# Patient Record
Sex: Female | Born: 1945 | Race: White | Hispanic: No | Marital: Single | Smoking: Former smoker
Health system: Southern US, Community
[De-identification: ages and names within clinical notes are randomized; demographics above are authoritative.]

## PROBLEM LIST (undated history)

## (undated) DIAGNOSIS — M810 Age-related osteoporosis without current pathological fracture: Secondary | ICD-10-CM

## (undated) DIAGNOSIS — K222 Esophageal obstruction: Secondary | ICD-10-CM

## (undated) DIAGNOSIS — F338 Other recurrent depressive disorders: Secondary | ICD-10-CM

## (undated) DIAGNOSIS — K589 Irritable bowel syndrome without diarrhea: Secondary | ICD-10-CM

## (undated) DIAGNOSIS — M4800 Spinal stenosis, site unspecified: Secondary | ICD-10-CM

## (undated) DIAGNOSIS — M1990 Unspecified osteoarthritis, unspecified site: Secondary | ICD-10-CM

## (undated) DIAGNOSIS — K859 Acute pancreatitis without necrosis or infection, unspecified: Secondary | ICD-10-CM

## (undated) DIAGNOSIS — T7840XA Allergy, unspecified, initial encounter: Secondary | ICD-10-CM

## (undated) DIAGNOSIS — K635 Polyp of colon: Secondary | ICD-10-CM

## (undated) DIAGNOSIS — E079 Disorder of thyroid, unspecified: Secondary | ICD-10-CM

## (undated) HISTORY — DX: Acute pancreatitis without necrosis or infection, unspecified: K85.90

## (undated) HISTORY — DX: Irritable bowel syndrome, unspecified: K58.9

## (undated) HISTORY — DX: Allergy, unspecified, initial encounter: T78.40XA

## (undated) HISTORY — DX: Polyp of colon: K63.5

## (undated) HISTORY — DX: Esophageal obstruction: K22.2

## (undated) HISTORY — DX: Spinal stenosis, site unspecified: M48.00

## (undated) HISTORY — PX: JOINT REPLACEMENT: SHX530

## (undated) HISTORY — PX: TONSILLECTOMY: SUR1361

## (undated) HISTORY — DX: Age-related osteoporosis without current pathological fracture: M81.0

## (undated) HISTORY — DX: Unspecified osteoarthritis, unspecified site: M19.90

## (undated) HISTORY — DX: Other recurrent depressive disorders: F33.8

## (undated) HISTORY — DX: Disorder of thyroid, unspecified: E07.9

## (undated) HISTORY — PX: FOOT SURGERY: SHX648

---

## 1997-12-08 ENCOUNTER — Ambulatory Visit (HOSPITAL_COMMUNITY): Admission: RE | Admit: 1997-12-08 | Discharge: 1997-12-08 | Payer: Self-pay | Admitting: Orthopedic Surgery

## 1999-01-26 ENCOUNTER — Ambulatory Visit (HOSPITAL_COMMUNITY): Admission: RE | Admit: 1999-01-26 | Discharge: 1999-01-26 | Payer: Self-pay | Admitting: Specialist

## 1999-01-26 ENCOUNTER — Encounter: Payer: Self-pay | Admitting: Specialist

## 1999-05-24 HISTORY — PX: HIP SURGERY: SHX245

## 2000-01-25 ENCOUNTER — Other Ambulatory Visit: Admission: RE | Admit: 2000-01-25 | Discharge: 2000-01-25 | Payer: Self-pay | Admitting: Obstetrics and Gynecology

## 2000-04-26 ENCOUNTER — Ambulatory Visit (HOSPITAL_COMMUNITY): Admission: RE | Admit: 2000-04-26 | Discharge: 2000-04-26 | Payer: Self-pay | Admitting: Gastroenterology

## 2000-04-26 ENCOUNTER — Encounter (INDEPENDENT_AMBULATORY_CARE_PROVIDER_SITE_OTHER): Payer: Self-pay | Admitting: Specialist

## 2001-01-31 ENCOUNTER — Other Ambulatory Visit: Admission: RE | Admit: 2001-01-31 | Discharge: 2001-01-31 | Payer: Self-pay | Admitting: Obstetrics and Gynecology

## 2001-08-23 ENCOUNTER — Ambulatory Visit (HOSPITAL_COMMUNITY): Admission: RE | Admit: 2001-08-23 | Discharge: 2001-08-23 | Payer: Self-pay | Admitting: Gastroenterology

## 2002-01-31 ENCOUNTER — Other Ambulatory Visit: Admission: RE | Admit: 2002-01-31 | Discharge: 2002-01-31 | Payer: Self-pay | Admitting: Obstetrics and Gynecology

## 2003-02-03 ENCOUNTER — Other Ambulatory Visit: Admission: RE | Admit: 2003-02-03 | Discharge: 2003-02-03 | Payer: Self-pay | Admitting: Obstetrics and Gynecology

## 2004-02-18 ENCOUNTER — Other Ambulatory Visit: Admission: RE | Admit: 2004-02-18 | Discharge: 2004-02-18 | Payer: Self-pay | Admitting: Obstetrics and Gynecology

## 2004-06-04 ENCOUNTER — Emergency Department (HOSPITAL_COMMUNITY): Admission: EM | Admit: 2004-06-04 | Discharge: 2004-06-04 | Payer: Self-pay | Admitting: Family Medicine

## 2004-06-14 ENCOUNTER — Encounter: Admission: RE | Admit: 2004-06-14 | Discharge: 2004-06-14 | Payer: Self-pay | Admitting: Family Medicine

## 2005-02-18 ENCOUNTER — Other Ambulatory Visit: Admission: RE | Admit: 2005-02-18 | Discharge: 2005-02-18 | Payer: Self-pay | Admitting: Obstetrics and Gynecology

## 2006-02-28 ENCOUNTER — Other Ambulatory Visit: Admission: RE | Admit: 2006-02-28 | Discharge: 2006-02-28 | Payer: Self-pay | Admitting: Obstetrics and Gynecology

## 2006-11-14 ENCOUNTER — Ambulatory Visit (HOSPITAL_BASED_OUTPATIENT_CLINIC_OR_DEPARTMENT_OTHER): Admission: RE | Admit: 2006-11-14 | Discharge: 2006-11-14 | Payer: Self-pay | Admitting: Orthopedic Surgery

## 2007-02-28 ENCOUNTER — Encounter: Admission: RE | Admit: 2007-02-28 | Discharge: 2007-02-28 | Payer: Self-pay | Admitting: Family Medicine

## 2007-04-10 ENCOUNTER — Other Ambulatory Visit: Admission: RE | Admit: 2007-04-10 | Discharge: 2007-04-10 | Payer: Self-pay | Admitting: Obstetrics and Gynecology

## 2008-05-08 ENCOUNTER — Encounter: Payer: Self-pay | Admitting: Obstetrics and Gynecology

## 2008-05-08 ENCOUNTER — Ambulatory Visit: Payer: Self-pay | Admitting: Obstetrics and Gynecology

## 2008-05-08 ENCOUNTER — Other Ambulatory Visit: Admission: RE | Admit: 2008-05-08 | Discharge: 2008-05-08 | Payer: Self-pay | Admitting: Obstetrics and Gynecology

## 2008-08-26 ENCOUNTER — Ambulatory Visit: Payer: Self-pay | Admitting: Internal Medicine

## 2008-11-21 ENCOUNTER — Encounter: Admission: RE | Admit: 2008-11-21 | Discharge: 2008-11-21 | Payer: Self-pay | Admitting: Internal Medicine

## 2008-11-21 ENCOUNTER — Ambulatory Visit: Payer: Self-pay | Admitting: Internal Medicine

## 2009-05-23 ENCOUNTER — Ambulatory Visit: Payer: Self-pay | Admitting: Internal Medicine

## 2009-09-03 ENCOUNTER — Ambulatory Visit: Payer: Self-pay | Admitting: Internal Medicine

## 2009-10-08 ENCOUNTER — Ambulatory Visit: Payer: Self-pay | Admitting: Internal Medicine

## 2010-03-04 ENCOUNTER — Ambulatory Visit: Payer: Self-pay | Admitting: Internal Medicine

## 2010-09-09 ENCOUNTER — Ambulatory Visit (INDEPENDENT_AMBULATORY_CARE_PROVIDER_SITE_OTHER): Payer: 59 | Admitting: Internal Medicine

## 2010-09-09 DIAGNOSIS — E039 Hypothyroidism, unspecified: Secondary | ICD-10-CM

## 2010-09-09 DIAGNOSIS — E785 Hyperlipidemia, unspecified: Secondary | ICD-10-CM

## 2010-10-04 ENCOUNTER — Other Ambulatory Visit (INDEPENDENT_AMBULATORY_CARE_PROVIDER_SITE_OTHER): Payer: 59

## 2010-10-04 VITALS — Temp 98.4°F

## 2010-10-04 DIAGNOSIS — R300 Dysuria: Secondary | ICD-10-CM

## 2010-10-04 LAB — POCT URINALYSIS DIPSTICK
Bilirubin, UA: NEGATIVE
Blood, UA: NEGATIVE
Glucose, UA: NEGATIVE
Ketones, UA: NEGATIVE
Nitrite, UA: NEGATIVE
Protein, UA: NEGATIVE
Spec Grav, UA: 1.005
Urobilinogen, UA: NEGATIVE
pH, UA: 7.5

## 2010-10-04 MED ORDER — CEPHALEXIN 500 MG PO CAPS: 500.0000 mg | ORAL_CAPSULE | Freq: Four times a day (QID) | ORAL | Status: AC

## 2010-10-04 NOTE — Progress Notes (Signed)
Patient here with dysuria, and frequency past 4 days. No fever, no nausea vomiting.

## 2010-10-05 NOTE — Op Note (Signed)
NAMEIVONE, LICHT                ACCOUNT NO.:  [id number]   MEDICAL RECORD NO.:  [id number]          PATIENT TYPE:  AMB   LOCATION:  DSC                          FACILITY:  [facility]   PHYSICIAN:  Leonides Grills, M.D.     DATE OF BIRTH:  [birth year]   DATE OF PROCEDURE:  11/14/2006  DATE OF DISCHARGE:                               OPERATIVE REPORT   PREOPERATIVE DIAGNOSES:  1. Left second, third metatarsalgia.  2. Left second, third and fourth hammertoes.  3. Left fourth claw toe.   POSTOPERATIVE DIAGNOSES:  1. Left second, third metatarsalgia.  2. Left second, third and fourth hammertoes.  3. Left fourth claw toe.   OPERATION:  1. Left second and third Weil metatarsal shortening osteotomies.  2. Left second, third and fourth toes metatarsophalangeal joint dorsal      capsulotomy with collateral release.  3. Left second, third and fourth toes extensor digitorum brevis to      extensor digitorum longus tendon transfers.  4. Left third toe medial collateral ligament plication.  5. Left fourth toe middle phalanx head resection with extensor      digitorum longus shortening.  6. Left fourth toe flexor pollicis longus tenotomy.   ANESTHESIA:  General with block.   SURGEON:  Leonides Grills, M.D.   ASSISTANT:  Evlyn Kanner, P.A.   ESTIMATED BLOOD LOSS:  Minimal.   TOURNIQUET TIME:  Approximately hour.   COMPLICATIONS:  None.   DISPOSITION:  Stable to PR.   INDICATIONS:  This is a 65 year old female who has had longstanding left  forefoot pain that is interfering with her life to the point where she  could not do what she wanted to do despite conservative management.  She  was consented for the above procedure.  All risks which include  infection, nerve or vessel injury, recurrence of deformity, persistent  pain, nonunion, malunion, hardware irritation, hardware failure,  weakness of toe extension and troop toe, cock-up toe deformity were all  explained.  Questions encouraged  answered.   OPERATION:  The patient brought to the operating placed in supine  position after adequate general endotracheal tube anesthesia was  administered with block as well as Ancef gram IV piggyback, left lower  extremity was prepped and draped in sterile manner over a proximally  thigh tourniquet.  Limb was gravity exsanguinated.  Tourniquet elevated  290 mmHg.  A longitudinal incision over the dorsal aspect left second  toe was then made.  Dissection was carried down through skin.  Hemostasis was obtained.  EDL and EDB tendons were identified.  These  were carefully dissected out.  The EDL was then tenotomized proximal  medial and the brevis distal lateral and retracted out of harm's way for  later transfer.  The MTP joint dorsal capsulotomy was then performed  with a 15 blade scalpel protecting soft tissues both medial and  laterally.  The collateral ligaments released as well.  We then plantar  flexed the toe fully and then performed a Weil metatarsal area osteotomy  with stacked sagittal saw blades.  The cut was made parallel to plantar  aspect of foot.  The head was then translated approximately 3-4 mm  proximally and fixed with a 1.5-mm fully threaded cortical set screw  measuring 12 mm in length using a 1.1-mm drill hole respectively.  This  excellent purchase and maintenance of the correction.  This was also  countersunk.  The redundant bone ledge dorsally was then trimmed off  with the rongeur carefully.  Joint were copiously irrigated with normal  saline.  Bone wax applied to exposed bone surfaces.  We then performed  an EDB to EPL tendon transfer using 3-0 PDS stitch.  This had an  outstanding transfer. We then went to the fourth toe and due to claw toe  deformity along with the tight extensor apparatus we performed a  longitudinal incision over the MTP joint area.  Dissection was carried  down through skin.  Hemostasis was obtained.  The EDL tendon was  tenotomized  proximal medial and brevis distal lateral and retracted out  of harm's way for later transfer.  We then made a longitudinal incision  over the dorsal aspect of the fourth toe DIP joint area.  Dissection was  carried down through skin.  Hemostasis was obtained and the EDL tendon  was tenotomized temporarily and retracted out of harm's way.  The DIP  joint was then entered.  The head was then removed with sagittal saw  followed by a rongeur.  The joint was then reduced and was in excellent  position we then made a longitudinal incision plantar aspect of the  fourth toe DIP.  Dissection was carried down through skin.  Hemostasis  was obtained.  EDL tendon was carefully dissected out and then  tenotomized with the 15 blade scalpel.  We then went back to the dorsal  aspect the DIP joint and then using a 3-0 PDS stitch.  We repaired and  shortened the EDL tendon.  This had an Conservation officer, historic buildings.  We then went  back to the dorsal aspect of the fourth toe MTP joint and performed the  EDB to EDL tendon transfer using 3-0 PDS stitch.  This had an  outstanding transfer, toes were excellent position.  The wounds were  copiously irrigated normal saline, tourniquet deflated.  Hemostasis was  obtained, final stress x-rays were obtained AP and lateral planes and  showed no gross motion across the osteotomy site, fixation proper  position and excellent alignment as well.  We then closed the wounds  with 4-0 nylon stitch.  Sterile dressing was applied.  Hard sole shoe  was applied.  The patient was stable to PR.   POSTOP COURSE:  The patient will follow-up in two weeks.  At that time  remove the dressing as well as suture.  We will then have her heal  weight bear for a total of 6 weeks and then weight bear as tolerated  hard sole shoe for additional two more weeks.  At two months postop, she  will go into a normal shoe.  Skin mobilization over the toes to start  immediately after the sutures removed.   Plantar flexion stretching is  not to commence until approximately six to eight weeks postop.  We will  give her also a double total loop for her toes once the sutures are  removed that she will wear.  Elevation is encouraged.      Leonides Grills, M.D.  Electronically Signed     PB/MEDQ  D:  11/14/2006  T:  11/14/2006  Job:  [id number]

## 2010-10-08 LAB — URINE CULTURE: Colony Count: >=100000

## 2010-10-11 ENCOUNTER — Other Ambulatory Visit: Payer: Self-pay | Admitting: *Deleted

## 2010-10-11 DIAGNOSIS — E039 Hypothyroidism, unspecified: Secondary | ICD-10-CM

## 2010-10-11 DIAGNOSIS — M1990 Unspecified osteoarthritis, unspecified site: Secondary | ICD-10-CM

## 2010-10-11 MED ORDER — LEVOTHYROXINE SODIUM 50 MCG PO TABS: 50.0000 ug | ORAL_TABLET | Freq: Every day | ORAL | Status: DC

## 2010-10-11 MED ORDER — CELECOXIB 200 MG PO CAPS: 200.0000 mg | ORAL_CAPSULE | Freq: Every day | ORAL | Status: AC

## 2011-03-02 ENCOUNTER — Encounter: Payer: Self-pay | Admitting: Internal Medicine

## 2011-03-08 ENCOUNTER — Other Ambulatory Visit: Payer: 59 | Admitting: Internal Medicine

## 2011-03-08 DIAGNOSIS — Z0000 Encounter for general adult medical examination without abnormal findings: Secondary | ICD-10-CM

## 2011-03-08 LAB — COMPREHENSIVE METABOLIC PANEL
ALT: 19 U/L (ref 0–35)
AST: 26 U/L (ref 0–37)
Albumin: 4.3 g/dL (ref 3.5–5.2)
Alkaline Phosphatase: 67 U/L (ref 39–117)
BUN: 16 mg/dL (ref 6–23)
Potassium: 4.4 mEq/L (ref 3.5–5.3)
Sodium: 141 mEq/L (ref 135–145)
Total Protein: 6.5 g/dL (ref 6.0–8.3)

## 2011-03-08 LAB — LIPID PANEL
Cholesterol: 235 mg/dL — ABNORMAL HIGH (ref 0–200)
HDL: 79 mg/dL (ref >39)
Total CHOL/HDL Ratio: 3 Ratio

## 2011-03-08 LAB — CBC WITH DIFFERENTIAL/PLATELET
Basophils Absolute: 0 10*3/uL (ref 0.0–0.1)
Basophils Relative: 1 % (ref 0–1)
MCHC: 32.4 g/dL (ref 30.0–36.0)
Neutro Abs: 4.4 10*3/uL (ref 1.7–7.7)
Neutrophils Relative %: 68 % (ref 43–77)
Platelets: 304 10*3/uL (ref 150–400)
RDW: 13.5 % (ref 11.5–15.5)
WBC: 6.5 10*3/uL (ref 4.0–10.5)

## 2011-03-08 LAB — TSH: TSH: 2.448 u[IU]/mL (ref 0.350–4.500)

## 2011-03-09 LAB — POCT HEMOGLOBIN-HEMACUE: Operator id: 128471

## 2011-03-10 ENCOUNTER — Encounter: Payer: Self-pay | Admitting: Internal Medicine

## 2011-03-10 ENCOUNTER — Ambulatory Visit (INDEPENDENT_AMBULATORY_CARE_PROVIDER_SITE_OTHER): Payer: 59 | Admitting: Internal Medicine

## 2011-03-10 DIAGNOSIS — M4800 Spinal stenosis, site unspecified: Secondary | ICD-10-CM

## 2011-03-10 DIAGNOSIS — E039 Hypothyroidism, unspecified: Secondary | ICD-10-CM

## 2011-03-10 DIAGNOSIS — J309 Allergic rhinitis, unspecified: Secondary | ICD-10-CM

## 2011-03-10 DIAGNOSIS — R300 Dysuria: Secondary | ICD-10-CM

## 2011-03-10 DIAGNOSIS — F39 Unspecified mood [affective] disorder: Secondary | ICD-10-CM

## 2011-03-10 DIAGNOSIS — Z0000 Encounter for general adult medical examination without abnormal findings: Secondary | ICD-10-CM

## 2011-03-10 DIAGNOSIS — M1990 Unspecified osteoarthritis, unspecified site: Secondary | ICD-10-CM

## 2011-03-10 DIAGNOSIS — F338 Other recurrent depressive disorders: Secondary | ICD-10-CM

## 2011-03-10 DIAGNOSIS — Z86010 Personal history of colonic polyps: Secondary | ICD-10-CM

## 2011-03-10 DIAGNOSIS — K219 Gastro-esophageal reflux disease without esophagitis: Secondary | ICD-10-CM

## 2011-03-10 DIAGNOSIS — K5900 Constipation, unspecified: Secondary | ICD-10-CM

## 2011-03-10 DIAGNOSIS — J45990 Exercise induced bronchospasm: Secondary | ICD-10-CM

## 2011-03-10 DIAGNOSIS — Z23 Encounter for immunization: Secondary | ICD-10-CM

## 2011-03-10 LAB — POCT URINALYSIS DIPSTICK
Glucose, UA: NEGATIVE
Ketones, UA: NEGATIVE
Spec Grav, UA: 1.005
Urobilinogen, UA: NEGATIVE

## 2011-03-10 MED ORDER — OMEGA-3-ACID ETHYL ESTERS 1 G PO CAPS: 2.0000 g | ORAL_CAPSULE | Freq: Two times a day (BID) | ORAL | Status: DC

## 2011-03-10 MED ORDER — LEVOTHYROXINE SODIUM 50 MCG PO TABS: 50.0000 ug | ORAL_TABLET | Freq: Every day | ORAL | Status: DC

## 2011-03-14 LAB — URINE CULTURE: Colony Count: >=100000

## 2011-03-27 DIAGNOSIS — J309 Allergic rhinitis, unspecified: Secondary | ICD-10-CM | POA: Insufficient documentation

## 2011-03-27 DIAGNOSIS — K219 Gastro-esophageal reflux disease without esophagitis: Secondary | ICD-10-CM | POA: Insufficient documentation

## 2011-03-27 DIAGNOSIS — F338 Other recurrent depressive disorders: Secondary | ICD-10-CM | POA: Insufficient documentation

## 2011-03-27 DIAGNOSIS — E039 Hypothyroidism, unspecified: Secondary | ICD-10-CM | POA: Insufficient documentation

## 2011-03-27 DIAGNOSIS — M4800 Spinal stenosis, site unspecified: Secondary | ICD-10-CM | POA: Insufficient documentation

## 2011-03-27 DIAGNOSIS — K5900 Constipation, unspecified: Secondary | ICD-10-CM | POA: Insufficient documentation

## 2011-03-27 DIAGNOSIS — M1990 Unspecified osteoarthritis, unspecified site: Secondary | ICD-10-CM | POA: Insufficient documentation

## 2011-03-27 DIAGNOSIS — J45990 Exercise induced bronchospasm: Secondary | ICD-10-CM | POA: Insufficient documentation

## 2011-03-27 NOTE — Patient Instructions (Signed)
Continue same medications. Return in 6 months for office visit, lipid panel and TSH

## 2011-03-27 NOTE — Progress Notes (Signed)
  Subjective:    Patient ID: Chelsea Cantrell, female    DOB: [birth year], 65 y.o.   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 In December 2010 had shots every 3 and dilated by Dr. Kinnie Scales. Says she's had dilatation for 5 times throughout her life. She is a vegetarian. Long-standing history of constipation treated by Dr. Kinnie Scales with Amitiza. History of hypothyroidism. History of mild hyperlipidemia treated with Lovaza. History of osteoarthritis in hands and back. History of exercise-induced asthma. History of allergic rhinitis and takes allergy injections. History of hyperplastic colon polyps. History of spinal stenosis and in 2011 had epidural steroid injection by Dr. Ethelene Hal. History of frequent bouts of pneumonia. Was on Wellbutrin for a while for seasonal affective disorder has quit taking that. [facility]. Shelle Iron at [facility] has treated her for back pain. GYN is Dr. Eda Paschal.  Single, nonsmoker, social alcohol consumption. Lives alone. Quit smoking in 1976. 4 year college degree.  Had tonsillectomy 1956, fractured left radial head proximally 2000. Right hip resurfacing surgery 2001. Surgery on left metatarsals 25 and 09/14/2006.  Mother died at age 74. Had stroke 4 years before she died. Basically just had general decline in health. Father died at 16 of lung cancer had history of hyperlipidemia and an MI at age 48. One brother and one sister in good health. No children.    Review of Systems  Constitutional: Negative.   HENT: Negative.   Eyes: Negative.   Respiratory: Negative.   Cardiovascular: Negative.   Gastrointestinal:       Chronic constipation controlled with medication  Musculoskeletal: Positive for back pain.  Neurological: Negative.   Hematological: Negative.   Psychiatric/Behavioral:       Seasonal affective disorder stable off Wellbutrin       Objective:   Physical Exam  Vitals reviewed. Constitutional: She is oriented to  person, place, and time. She appears well-nourished. No distress.  HENT:  Head: Normocephalic and atraumatic.  Right Ear: External ear normal.  Left Ear: External ear normal.  Mouth/Throat: Oropharynx is clear and moist.  Eyes: Conjunctivae and EOM are normal. Pupils are equal, round, and reactive to light. No scleral icterus.  Neck: Neck supple. No JVD present. No thyromegaly present.  Cardiovascular: Normal rate, regular rhythm and normal heart sounds.   No murmur heard. Pulmonary/Chest: Effort normal and breath sounds normal. She has no wheezes. She has no rales.  Abdominal: Soft. Bowel sounds are normal. She exhibits no mass. There is no tenderness.  Genitourinary:       Deferred to GYN  Musculoskeletal: Normal range of motion. She exhibits no edema.  Lymphadenopathy:    She has no cervical adenopathy.  Neurological: She is alert and oriented to person, place, and time. No cranial nerve deficit. Coordination normal.  Skin: Skin is warm and dry. She is not diaphoretic.  Psychiatric: She has a normal mood and affect. Her behavior is normal.          Assessment & Plan:  GE reflux  Osteoarthritis  Spinal stenosis  Hypothyroidism  History of hyperplastic colon polyps  History of Schatzki's ring dilatation  History of seasonal affective disorder  Allergic rhinitis  History of exercise-induced asthma  Hyperlipidemia  Plan return in 6 months for office visit and TSH and fasting lipid panel

## 2011-04-04 ENCOUNTER — Other Ambulatory Visit: Payer: Self-pay

## 2011-04-04 DIAGNOSIS — E039 Hypothyroidism, unspecified: Secondary | ICD-10-CM

## 2011-04-04 MED ORDER — SYNTHROID 50 MCG PO TABS: 50.0000 ug | ORAL_TABLET | Freq: Every day | ORAL | Status: DC

## 2011-04-18 ENCOUNTER — Ambulatory Visit (INDEPENDENT_AMBULATORY_CARE_PROVIDER_SITE_OTHER): Payer: 59 | Admitting: Internal Medicine

## 2011-04-18 ENCOUNTER — Encounter: Payer: Self-pay | Admitting: Internal Medicine

## 2011-04-18 VITALS — BP 122/74 | HR 58 | Wt 150.0 lb

## 2011-04-18 DIAGNOSIS — T148XXA Other injury of unspecified body region, initial encounter: Secondary | ICD-10-CM

## 2011-04-18 DIAGNOSIS — W57XXXA Bitten or stung by nonvenomous insect and other nonvenomous arthropods, initial encounter: Secondary | ICD-10-CM

## 2011-04-18 NOTE — Progress Notes (Signed)
  Subjective:    Patient ID: Chelsea Cantrell, female    DOB: [birth year], 65 y.o.   MRN: [id number]  HPI  patient  went hiking yesterday and this morning discovered a small tick on her right anterior abdomen. She tried to get the tick out but think she may have left a part of it inside. There is a dark spot in the center of the bite area with some surrounding erythema. Explained to patient to watch for signs of tickborne illnesses starting 10 days from exposure. Her tetanus immunization is up-to-date.    Review of Systems     Objective:   Physical Exam tick bite right anterior abdominal wall with apparently part of tick still left in center of bite With surrounding erythema approximately 1/4 in diameter. No drainage.        Assessment & Plan:  Tick bite right anterior abdomen wall plan Bactroban ointment to use on bite area twice daily until healed. If itching begins, triamcinolone cream 0.1% to use on bite area 3 times a day sparingly. Watch for signs of tickborne illness. Call if lesion developed into an abscess

## 2011-04-18 NOTE — Patient Instructions (Signed)
Watch for signs of tickborne illness beginning 10 days after exposure. Daily. Clean and dry apply Bactroban ointment twice daily. If the itching begins to use triamcinolone cream 0.1% 3 times daily. Call if evidence of secondary bacterial infection develops.

## 2011-07-18 ENCOUNTER — Ambulatory Visit: Payer: 59 | Admitting: Physical Therapy

## 2011-09-15 ENCOUNTER — Other Ambulatory Visit: Payer: 59 | Admitting: Internal Medicine

## 2011-09-15 DIAGNOSIS — E039 Hypothyroidism, unspecified: Secondary | ICD-10-CM

## 2011-09-15 DIAGNOSIS — E7800 Pure hypercholesterolemia, unspecified: Secondary | ICD-10-CM

## 2011-09-15 LAB — LIPID PANEL
HDL: 68 mg/dL (ref >39)
LDL Cholesterol: 119 mg/dL — ABNORMAL HIGH (ref 0–99)
VLDL: 12 mg/dL (ref 0–40)

## 2011-09-16 ENCOUNTER — Other Ambulatory Visit: Payer: 59 | Admitting: Internal Medicine

## 2011-09-16 ENCOUNTER — Encounter: Payer: Self-pay | Admitting: Internal Medicine

## 2011-09-16 ENCOUNTER — Ambulatory Visit (INDEPENDENT_AMBULATORY_CARE_PROVIDER_SITE_OTHER): Payer: 59 | Admitting: Internal Medicine

## 2011-09-16 VITALS — BP 120/80 | Wt 149.5 lb

## 2011-09-16 DIAGNOSIS — K5900 Constipation, unspecified: Secondary | ICD-10-CM

## 2011-09-16 DIAGNOSIS — J309 Allergic rhinitis, unspecified: Secondary | ICD-10-CM

## 2011-09-16 DIAGNOSIS — J029 Acute pharyngitis, unspecified: Secondary | ICD-10-CM

## 2011-09-16 DIAGNOSIS — F338 Other recurrent depressive disorders: Secondary | ICD-10-CM

## 2011-09-16 DIAGNOSIS — F39 Unspecified mood [affective] disorder: Secondary | ICD-10-CM

## 2011-09-16 DIAGNOSIS — E039 Hypothyroidism, unspecified: Secondary | ICD-10-CM

## 2011-09-16 DIAGNOSIS — E785 Hyperlipidemia, unspecified: Secondary | ICD-10-CM

## 2011-09-16 DIAGNOSIS — M1990 Unspecified osteoarthritis, unspecified site: Secondary | ICD-10-CM

## 2011-09-16 DIAGNOSIS — M4800 Spinal stenosis, site unspecified: Secondary | ICD-10-CM

## 2011-09-16 DIAGNOSIS — J069 Acute upper respiratory infection, unspecified: Secondary | ICD-10-CM

## 2011-09-16 DIAGNOSIS — K219 Gastro-esophageal reflux disease without esophagitis: Secondary | ICD-10-CM

## 2011-09-16 LAB — TSH: TSH: 1.163 u[IU]/mL (ref 0.350–4.500)

## 2011-09-16 NOTE — Progress Notes (Signed)
  Subjective:    Patient ID: Chelsea Cantrell, female    DOB: Mar 20, 1946, 66 y.o.   MRN: [id number]  HPI In today for six-month recheck. History of GE reflux and hypothyroidism. TSH is within normal limits. She has a respiratory infection that started with a sore throat. Thinks she may have had a fever but did not check her temperature. Slight cough. Constipation under good control on Amitiza. Dr. Kinnie Scales prescribes Amitiza. Arthritis under control with Celebrex. History of allergic rhinitis and seasonal affective disorder. History of hyperlipidemia and takes Lovaza. History of exercise-induced asthma. History of spinal stenosis and osteoarthritis. Back pain has been fairly well controlled recently.    Review of Systems     Objective:   Physical Exam HEENT exam: Pharynx slightly injected without exudate; TMs are clear; neck is supple, chest clear to auscultation. Cardiac exam regular rate and rhythm normal S1 and S2.        Assessment & Plan:  Hypothyroidism  Upper respiratory infection  Exercise-induced asthma  GE reflux  Spinal stenosis  Hyperlipidemia  Plan: Zithromax Z-Pak take 2 tablets by mouth day one followed by 1 tablet by mouth days 2 through 5 for respiratory infection. Continue same medications and return for physical exam in 6 months. Continue same dose of Synthroid. TSH is within normal limits.

## 2011-09-16 NOTE — Patient Instructions (Signed)
Take Zithromax Z-PAK as directed for respiratory infection. Continue same dose of Synthroid. Return in 6 months for physical exam.

## 2011-09-19 ENCOUNTER — Encounter: Payer: Self-pay | Admitting: Internal Medicine

## 2011-09-23 ENCOUNTER — Other Ambulatory Visit: Payer: Self-pay

## 2011-09-23 DIAGNOSIS — E039 Hypothyroidism, unspecified: Secondary | ICD-10-CM

## 2011-09-23 MED ORDER — SYNTHROID 50 MCG PO TABS: 50.0000 ug | ORAL_TABLET | Freq: Every day | ORAL | Status: DC

## 2011-10-10 ENCOUNTER — Other Ambulatory Visit: Payer: Self-pay

## 2011-10-10 MED ORDER — OMEGA-3-ACID ETHYL ESTERS 1 G PO CAPS: 2.0000 g | ORAL_CAPSULE | Freq: Two times a day (BID) | ORAL | Status: DC

## 2012-01-09 ENCOUNTER — Other Ambulatory Visit: Payer: Self-pay

## 2012-01-09 MED ORDER — CELECOXIB 200 MG PO CAPS: 200.0000 mg | ORAL_CAPSULE | Freq: Two times a day (BID) | ORAL | Status: DC

## 2012-01-16 ENCOUNTER — Telehealth: Payer: Self-pay | Admitting: Internal Medicine

## 2012-01-16 NOTE — Telephone Encounter (Signed)
Pharmacy asked that we clarify dose of Celebrex. They have gotten prescriptions for Celebrex 200 mg daily and Celebrex 200 mg twice daily. Clarify that patient is to take it 200 mg once daily only

## 2012-02-16 ENCOUNTER — Encounter: Payer: Self-pay | Admitting: Internal Medicine

## 2012-02-16 ENCOUNTER — Ambulatory Visit (INDEPENDENT_AMBULATORY_CARE_PROVIDER_SITE_OTHER): Payer: 59 | Admitting: Internal Medicine

## 2012-02-16 VITALS — BP 118/78 | HR 76 | Temp 98.4°F | Ht 68.0 in | Wt 146.0 lb

## 2012-02-16 DIAGNOSIS — R300 Dysuria: Secondary | ICD-10-CM

## 2012-02-16 LAB — POCT URINALYSIS DIPSTICK
Blood, UA: NEGATIVE
Protein, UA: NEGATIVE
Spec Grav, UA: 1.01
Urobilinogen, UA: NEGATIVE
pH, UA: 7.5

## 2012-02-16 NOTE — Patient Instructions (Addendum)
Take Cipro 500 mg twice daily for 7 days. Call if not better in 48 hours or sooner if worse.

## 2012-02-16 NOTE — Progress Notes (Signed)
  Subjective:    Patient ID: Chelsea Cantrell, female    DOB: 1945-07-26, 66 y.o.   MRN: [id number]  HPI 66 year old white female with onset recently of dysuria at of stream. No CVA tenderness. No shaking chills. No nausea and vomiting. No back pain.    Review of Systems     Objective:   Physical Exam No CVA tenderness. Urine dipstick shows trace LE. Culture taken.       Assessment & Plan:  Cystitis  Plan: Cipro 500 mg twice daily for 7 days.

## 2012-02-18 LAB — URINE CULTURE: Colony Count: 5000

## 2012-02-21 ENCOUNTER — Telehealth: Payer: Self-pay | Admitting: Internal Medicine

## 2012-02-21 DIAGNOSIS — N342 Other urethritis: Secondary | ICD-10-CM

## 2012-02-21 NOTE — Telephone Encounter (Signed)
Culture grew 5,000 colonies insignificant growth. Change to Doxycycline 100 mg bid (#14) for 7 days and if no better will need to see urologist.

## 2012-02-22 NOTE — Telephone Encounter (Signed)
Patient states she is feeling better now- only getting up once or twice a night to urinate. Does not feel she needs Doxycycline

## 2012-03-09 ENCOUNTER — Other Ambulatory Visit: Payer: 59 | Admitting: Internal Medicine

## 2012-03-09 DIAGNOSIS — Z0000 Encounter for general adult medical examination without abnormal findings: Secondary | ICD-10-CM

## 2012-03-09 DIAGNOSIS — E039 Hypothyroidism, unspecified: Secondary | ICD-10-CM

## 2012-03-09 DIAGNOSIS — M1990 Unspecified osteoarthritis, unspecified site: Secondary | ICD-10-CM

## 2012-03-09 LAB — COMPREHENSIVE METABOLIC PANEL
BUN: 12 mg/dL (ref 6–23)
CO2: 30 mEq/L (ref 19–32)
Calcium: 9.2 mg/dL (ref 8.4–10.5)
Chloride: 106 mEq/L (ref 96–112)
Creat: 0.84 mg/dL (ref 0.50–1.10)
Glucose, Bld: 82 mg/dL (ref 70–99)
Total Bilirubin: 0.5 mg/dL (ref 0.3–1.2)

## 2012-03-09 LAB — LIPID PANEL
Cholesterol: 166 mg/dL (ref 0–200)
LDL Cholesterol: 88 mg/dL (ref 0–99)
Triglycerides: 58 mg/dL (ref <150)

## 2012-03-09 LAB — CBC WITH DIFFERENTIAL/PLATELET
Basophils Relative: 1 % (ref 0–1)
Eosinophils Absolute: 0.2 10*3/uL (ref 0.0–0.7)
Eosinophils Relative: 4 % (ref 0–5)
HCT: 39.5 % (ref 36.0–46.0)
Hemoglobin: 13.1 g/dL (ref 12.0–15.0)
Lymphs Abs: 1.1 10*3/uL (ref 0.7–4.0)
MCH: 29.9 pg (ref 26.0–34.0)
MCHC: 33.2 g/dL (ref 30.0–36.0)
MCV: 90.2 fL (ref 78.0–100.0)
Monocytes Absolute: 0.5 10*3/uL (ref 0.1–1.0)
Monocytes Relative: 8 % (ref 3–12)
RBC: 4.38 MIL/uL (ref 3.87–5.11)

## 2012-03-10 LAB — VITAMIN D 25 HYDROXY (VIT D DEFICIENCY, FRACTURES): Vit D, 25-Hydroxy: 55 ng/mL (ref 30–89)

## 2012-03-10 LAB — TSH: TSH: 1.779 u[IU]/mL (ref 0.350–4.500)

## 2012-03-12 ENCOUNTER — Ambulatory Visit (INDEPENDENT_AMBULATORY_CARE_PROVIDER_SITE_OTHER): Payer: 59 | Admitting: Internal Medicine

## 2012-03-12 ENCOUNTER — Encounter: Payer: Self-pay | Admitting: Internal Medicine

## 2012-03-12 VITALS — BP 106/74 | HR 60 | Temp 97.0°F | Ht 67.5 in | Wt 144.0 lb

## 2012-03-12 DIAGNOSIS — R350 Frequency of micturition: Secondary | ICD-10-CM

## 2012-03-12 DIAGNOSIS — D126 Benign neoplasm of colon, unspecified: Secondary | ICD-10-CM

## 2012-03-12 DIAGNOSIS — Z23 Encounter for immunization: Secondary | ICD-10-CM

## 2012-03-12 DIAGNOSIS — K635 Polyp of colon: Secondary | ICD-10-CM

## 2012-03-12 DIAGNOSIS — R351 Nocturia: Secondary | ICD-10-CM

## 2012-03-12 DIAGNOSIS — K5900 Constipation, unspecified: Secondary | ICD-10-CM

## 2012-03-12 DIAGNOSIS — M1990 Unspecified osteoarthritis, unspecified site: Secondary | ICD-10-CM

## 2012-03-12 DIAGNOSIS — E039 Hypothyroidism, unspecified: Secondary | ICD-10-CM

## 2012-03-12 DIAGNOSIS — Z0000 Encounter for general adult medical examination without abnormal findings: Secondary | ICD-10-CM

## 2012-03-12 DIAGNOSIS — J45990 Exercise induced bronchospasm: Secondary | ICD-10-CM

## 2012-03-12 LAB — POCT URINALYSIS DIPSTICK
Protein, UA: NEGATIVE
Spec Grav, UA: 1.01
Urobilinogen, UA: NEGATIVE

## 2012-03-12 MED ORDER — SYNTHROID 50 MCG PO TABS: 50.0000 ug | ORAL_TABLET | Freq: Every day | ORAL | Status: DC

## 2012-03-12 NOTE — Progress Notes (Signed)
Subjective:    Patient ID: Chelsea Cantrell, female    DOB: 04-17-1946, 66 y.o.   MRN: [id number]  HPI 66 year old White female for health maintenance and evaluation of medical problems. Recently had noticed urinary frequency. Did initially have a little bit of dysuria. Took antibiotics and improved some but now has nocturia q 2 hours. Dr. Eda Paschal is GYN and she last saw him Dec 2012. Will have urologist see her for evaluation- saw Dr. Perley Jain in 2009 for similar symptoms. Wants flu vaccine today. Lab work reviewed and completely normal.  Is a vegetarian. Does not drink coffee, drinks 1/2 green and 1/2 black tea.  2 glasses wine at night.  She has hypothyroidism, GE reflux, exercise-induced asthma, history of Schatzki's renal and, allergic rhinitis, hyperplastic colon polyps, estrogen replacement, constipation, history of spinal stenosis, seasonal affect disorder.  Left radius fracture approximately 2000. Dilated Schatzki's ring December 2010. Left metatarsal surgery on second,third and fourth metatarsals 2008. History of right hip resurfacing 2001.  Tonsillectomy 1956. Has taken Lovaza for hyperlipidemia for some time. Previously seen by Dr. Smith Mince. Has been seen at [facility]. Dr. Kinnie Scales did colonoscopy March 25, 2010. 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 Ethelene Hal for epidural steroid injections in 2011. Takes Amitiza chronic constipation. Takes Celebrex for musculoskeletal pain along with chondroitin sulfate.  No known drug allergies but intolerant to generic Wellbutrin.  Had Pneumovax in 2008. Tetanus immunization February 2008.  Social history: Has never been married and is single. No children. She is an Psychologist, educational for [employer]. She exercises routinely. Currently nonsmoker. She did smoke prior to 1976. Has allergies to cats dogs and pollen. Dr. Stephannie Peters records indicate she may have attention deficit issues. She has been tried on  Strattera by Dr. Smith Mince in 2003     Review of Systems  Constitutional: Negative.   HENT:       Allergic rhinitis sees Dr Irena Cords  Eyes: Negative.   Respiratory: Negative.   Cardiovascular: Negative.   Gastrointestinal: Negative.   Genitourinary: Positive for frequency.       Nocturia and urinary frequency. GYN exam done by Dr Eda Paschal  Musculoskeletal: Positive for back pain.       Remote hx of hip resurfacing. History of osteoarthritis  Neurological: Negative.   Psychiatric/Behavioral: Negative.        Objective:   Physical Exam  Vitals reviewed. Constitutional: She is oriented to person, place, and time. She appears well-developed and well-nourished. No distress.  HENT:  Head: Normocephalic and atraumatic.  Right Ear: External ear normal.  Left Ear: External ear normal.  Mouth/Throat: Oropharynx is clear and moist.  Eyes: Conjunctivae normal and EOM are normal. Pupils are equal, round, and reactive to light. Right eye exhibits no discharge. Left eye exhibits no discharge. No scleral icterus.  Neck: Neck supple. No JVD present. No thyromegaly present.  Cardiovascular: Normal rate, regular rhythm, normal heart sounds and intact distal pulses.   No murmur heard. Pulmonary/Chest: Effort normal and breath sounds normal. She has no wheezes. She has no rales.  Abdominal: Soft. Bowel sounds are normal. She exhibits no distension and no mass. There is no tenderness. There is no rebound.  Genitourinary:       Deferred to Dr. Eda Paschal  Musculoskeletal: She exhibits no edema.  Lymphadenopathy:    She has no cervical adenopathy.  Neurological: She is alert and oriented to person, place, and time. She has normal reflexes. She displays normal reflexes. No  cranial nerve deficit. Coordination normal.  Skin: Skin is warm and dry. She is not diaphoretic.  Psychiatric: She has a normal mood and affect. Her behavior is normal. Judgment and thought content normal.            Assessment & Plan:  Osteoarthritis-takes Celebrex GERD Exercise induced asthma- uses inhaler before exercise Hyperplastic colon polyps Hx spinal stenosis and lumbar disc disease-treated previously with epidural steroids Not on estrogen replacement- has hot flashes Hx Schatzki's ring 2010- followed by Dr. Kinnie Scales Vegetarian diet Hyperlipidemia-treated with Lovaza History of hip resurfacing 2001 History of constipation treated with Amitiza History of allergic rhinitis History of seasonal affect it disorder previously treated with Wellbutrin  Plan: Return 6 months for office visit and TSH. Refer to urologist for problems with urinary frequency and nocturia.

## 2012-03-12 NOTE — Patient Instructions (Addendum)
Continue same medications, diet and exercise. Return in 6 months for office visit and TSH. Scheduled for an appointment with Dr. McDiarmid on 04/11/2012 at 8:30 am

## 2012-05-01 ENCOUNTER — Other Ambulatory Visit: Payer: Self-pay | Admitting: Plastic Surgery

## 2012-05-21 ENCOUNTER — Other Ambulatory Visit: Payer: Self-pay

## 2012-05-21 MED ORDER — OMEGA-3-ACID ETHYL ESTERS 1 G PO CAPS: 2.0000 g | ORAL_CAPSULE | Freq: Two times a day (BID) | ORAL | Status: DC

## 2012-06-18 ENCOUNTER — Encounter: Payer: Self-pay | Admitting: Gynecology

## 2012-06-18 DIAGNOSIS — K589 Irritable bowel syndrome without diarrhea: Secondary | ICD-10-CM | POA: Insufficient documentation

## 2012-06-26 ENCOUNTER — Encounter: Payer: Self-pay | Admitting: Gynecology

## 2012-06-26 ENCOUNTER — Ambulatory Visit (INDEPENDENT_AMBULATORY_CARE_PROVIDER_SITE_OTHER): Payer: 59 | Admitting: Gynecology

## 2012-06-26 ENCOUNTER — Other Ambulatory Visit (HOSPITAL_COMMUNITY)
Admission: RE | Admit: 2012-06-26 | Discharge: 2012-06-26 | Disposition: A | Discharge status: Home / Self Care | Payer: 59 | Source: Ambulatory Visit | Attending: Gynecology | Admitting: Gynecology

## 2012-06-26 VITALS — BP 120/70 | Ht 67.5 in | Wt 144.0 lb

## 2012-06-26 DIAGNOSIS — M8580 Other specified disorders of bone density and structure, unspecified site: Secondary | ICD-10-CM

## 2012-06-26 DIAGNOSIS — Z01419 Encounter for gynecological examination (general) (routine) without abnormal findings: Secondary | ICD-10-CM

## 2012-06-26 DIAGNOSIS — E079 Disorder of thyroid, unspecified: Secondary | ICD-10-CM | POA: Insufficient documentation

## 2012-06-26 DIAGNOSIS — N951 Menopausal and female climacteric states: Secondary | ICD-10-CM

## 2012-06-26 DIAGNOSIS — M899 Disorder of bone, unspecified: Secondary | ICD-10-CM

## 2012-06-26 DIAGNOSIS — Z1151 Encounter for screening for human papillomavirus (HPV): Secondary | ICD-10-CM | POA: Insufficient documentation

## 2012-06-26 DIAGNOSIS — N952 Postmenopausal atrophic vaginitis: Secondary | ICD-10-CM

## 2012-06-26 NOTE — Progress Notes (Signed)
Chelsea Cantrell [facility] 13-Jul-1945 [id number]        67 y.o.  G1P0010 for annual exam.  Has not been in the office since 2009. Former patient Dr. Verl Dicker. Several issues noted below.  Past medical history,surgical history, medications, allergies, family history and social history were all reviewed and documented in the EPIC chart. ROS:  Was performed and pertinent positives and negatives are included in the history.  Exam: Kim assistant Filed Vitals:   06/26/12 1025  BP: 120/70  Height: 5' 7.5" (1.715 m)  Weight: 144 lb (65.318 kg)   General appearance  Normal Skin grossly normal Head/Neck normal with no cervical or supraclavicular adenopathy thyroid normal Lungs  clear Cardiac RR, without RMG Abdominal  soft, nontender, without masses, organomegaly or hernia Breasts  examined lying and sitting without masses, retractions, discharge or axillary adenopathy. Pelvic  Ext/BUS/vagina  normal with atrophic changes  Cervix  normal with atrophic changes cervix somewhat stenotic.  Uterus  axial, normal size, shape and contour, midline and mobile nontender   Adnexa  Without masses or tenderness    Anus and perineum  normal   Rectovaginal  normal sphincter tone without palpated masses or tenderness.    Assessment/Plan:  67 y.o. G37P0010 female for annual exam.   1. Postmenopausal/menopausal symptoms/atrophic vaginitis. Patient has some hot flushes and sweats. Had been on Ogen/Prometrium. She stopped this and prefers no HRT.  I reviewed alternatives such as soy based as well as pharmacologic nonhormonal as Effexor or Brisdell. Patient is not interested and prefers just to monitor. She's not sexually active is not having daily symptoms from her atrophic vaginal changes. 2. Mammography 05/2012. We'll continue with annual mammography. SBE monthly reviewed. 3. Pap smear 2009. Pap/HPV done today. No history of abnormal Pap smears previously. 4. Osteopenia. DEXA 2007 T score -1.4. Recommend repeat DEXA  now she agrees to do so. She is very active with weightbearing exercise. Increase calcium vitamin D reviewed. 5. Colonoscopy 2011. Follow up with their recommended interval. 6. Health maintenance. No blood work done today as it is all done through her primary physician's office who she sees on a regular basis. Follow up one year, sooner as needed.    Dara Lords MD, 10:47 AM 06/26/2012

## 2012-06-26 NOTE — Patient Instructions (Signed)
Follow up for bone density as scheduled. Follow up in one year for annual gynecologic exam.

## 2012-06-26 NOTE — Addendum Note (Signed)
Addended by: Dayna Barker on: 06/26/2012 11:09 AM   Modules accepted: Orders

## 2012-06-27 LAB — URINALYSIS W MICROSCOPIC + REFLEX CULTURE
Bilirubin Urine: NEGATIVE
Glucose, UA: NEGATIVE mg/dL
Leukocytes, UA: NEGATIVE
Protein, ur: NEGATIVE mg/dL
Specific Gravity, Urine: 1.012 (ref 1.005–1.030)
Squamous Epithelial / LPF: NONE SEEN
Urobilinogen, UA: 0.2 mg/dL (ref 0.0–1.0)

## 2012-07-15 ENCOUNTER — Encounter: Payer: Self-pay | Admitting: Internal Medicine

## 2012-08-23 ENCOUNTER — Encounter: Payer: Self-pay | Admitting: Vascular Surgery

## 2012-08-24 ENCOUNTER — Encounter: Payer: 59 | Admitting: Vascular Surgery

## 2012-09-18 ENCOUNTER — Encounter: Payer: Self-pay | Admitting: Internal Medicine

## 2012-09-18 ENCOUNTER — Ambulatory Visit (INDEPENDENT_AMBULATORY_CARE_PROVIDER_SITE_OTHER): Payer: 59 | Admitting: Internal Medicine

## 2012-09-18 VITALS — BP 126/68 | HR 52 | Ht 67.5 in | Wt 139.0 lb

## 2012-09-18 DIAGNOSIS — E039 Hypothyroidism, unspecified: Secondary | ICD-10-CM

## 2012-09-18 DIAGNOSIS — I8390 Asymptomatic varicose veins of unspecified lower extremity: Secondary | ICD-10-CM

## 2012-09-18 LAB — TSH: TSH: 1.664 u[IU]/mL (ref 0.350–4.500)

## 2012-09-18 MED ORDER — SYNTHROID 50 MCG PO TABS: 50.0000 ug | ORAL_TABLET | Freq: Every day | ORAL | Status: DC

## 2012-09-18 MED ORDER — CELECOXIB 200 MG PO CAPS: 200.0000 mg | ORAL_CAPSULE | Freq: Every day | ORAL | Status: DC

## 2012-09-18 NOTE — Progress Notes (Signed)
  Subjective:    Patient ID: Chelsea Cantrell, female    DOB: 31-Jan-1946, 67 y.o.   MRN: [id number]  HPI  For 6 month recheck on hypothyroidism. Ran out yesterday. TSH drawn  today. Has had bronchitis , trees falling  in yard, and wisdom tooth extraction with dry socket. Has prominent vein Right lateral leg which she would like to have treated. Says it's tender at times. History of spinal stenosis and seasonal affect disorder in addition to hypothyroidism. History of constipation.    Review of Systems     Objective:   Physical Exam prominent varicosity lower right lateral leg. No evidence of thrombosis. No erythema. Neck is supple without thyromegaly. Chest clear to auscultation. Cardiac exam regular rate and rhythm normal S1 and S2. Extremities without edema. Skin is warm and dry.        Assessment & Plan:  Hypothyroidism-TSH drawn today  Right lower leg superficial varicosity-recommend treatment and the V V S  Seasonal affect disorder-stable  History of constipation  History of spinal stenosis  Plan: Return in 6 months for physical exam.  Addendum: TSH normal on current dose of Synthroid.

## 2012-09-18 NOTE — Patient Instructions (Addendum)
Continue same meds and return in 6 months. TSH is within normal limits.

## 2012-09-20 NOTE — Progress Notes (Signed)
Patient informed. 

## 2012-11-08 ENCOUNTER — Encounter: Payer: Self-pay | Admitting: Vascular Surgery

## 2012-11-08 ENCOUNTER — Ambulatory Visit (INDEPENDENT_AMBULATORY_CARE_PROVIDER_SITE_OTHER): Payer: 59 | Admitting: Vascular Surgery

## 2012-11-08 VITALS — BP 125/72 | HR 53 | Resp 18 | Ht 68.0 in | Wt 142.1 lb

## 2012-11-08 DIAGNOSIS — I83893 Varicose veins of bilateral lower extremities with other complications: Secondary | ICD-10-CM

## 2012-11-08 NOTE — Progress Notes (Signed)
[facility] and [facility]   Patient name: Zakara Parkey MRN: [id number] DOB: [birth year] Sex: female   Referred by: Lenord Fellers  Reason for referral:  Chief Complaint  Patient presents with  . New Evaluation    c/o right leg pain directly over varicosities above right ankle    . 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 She reports this is been progressive over the last several months. This is quite painful to her most particularly when she is exercising or standing for prolonged periods of time. She does have some swelling in his leg versus the other leg. She has no history of deep venous thrombosis no history of venous ulceration. This is an achy sensation also can have sharp sedation as well.  Past medical history as documented below. Main issues are of GI origin  Past Medical History  Diagnosis Date  . Schatzki's ring   . GERD (gastroesophageal reflux disease)   . Arthritis   . Asthma   . Allergy   . Colon polyps   . Constipation   . Spinal stenosis   . Seasonal affective disorder   . IBS (irritable bowel syndrome)   . Thyroid disease     Hypo  . Osteopenia 2007    T score -1.4    Past Surgical History  Procedure Laterality Date  . Hip surgery  2001    Replacement  . Foot surgery      History   Social History  . Marital Status: Single    Spouse Name: N/A    Number of Children: N/A  . Years of Education: N/A   Occupational History  . Not on file.   Social History Main Topics  . Smoking status: Former Smoker    Quit date: 05/23/1974  . Smokeless tobacco: Never Used  . Alcohol Use: 4.2 oz/week    7 Glasses of wine per week  . Drug Use: No  . Sexually Active: No   Other Topics Concern  . Not on file   Social History Narrative  . No narrative on file    Family History  Problem Relation Age of Onset  . Stroke Mother   .  Osteoporosis Mother   . Heart disease Father   . Hyperlipidemia Father   . Lung cancer Father     Allergies as of 11/08/2012  . (No Known Allergies)    Current Outpatient Prescriptions on File Prior to Visit  Medication Sig Dispense Refill  . celecoxib (CELEBREX) 200 MG capsule Take 1 capsule (200 mg total) by mouth daily.  90 capsule  3  . CHONDROITIN SULFATE PO Take 600 mg by mouth 4 (four) times daily.        Marland Kitchen lubiprostone (AMITIZA) 24 MCG capsule Take 24 mcg by mouth 2 (two) times daily with a meal.        . Multiple Vitamin (MULTIVITAMIN) tablet Take 1 tablet by mouth daily.        Marland Kitchen omega-3 acid ethyl esters (LOVAZA) 1 G capsule Take 2 capsules (2 g total) by mouth 2 (two) times daily.  180 capsule  3  . SYNTHROID 50 MCG tablet Take 1 tablet (50 mcg total) by mouth daily.  90 tablet  1  . VENTOLIN HFA 108 (90 BASE) MCG/ACT inhaler       . SYNTHROID 50 MCG tablet Take 1 tablet (50 mcg total) by mouth  daily.  30 tablet  0   No current facility-administered medications on file prior to visit.     REVIEW OF SYSTEMS:  Positives indicated with an "X"  CARDIOVASCULAR:  [ ]  chest pain   [ ]  chest pressure   [ ]  palpitations   [ ]  orthopnea   [ ]  dyspnea on exertion   [ ]  claudication   [ ]  rest pain   [ ]  DVT   [ ]  phlebitis PULMONARY:   [ ]  productive cough   [ ]  asthma   [ ]  wheezing NEUROLOGIC:   [ ]  weakness  [ ]  paresthesias  [ ]  aphasia  [ ]  amaurosis  [ ]  dizziness HEMATOLOGIC:   [ ]  bleeding problems   [ ]  clotting disorders MUSCULOSKELETAL:  [ ]  joint pain   [ ]  joint swelling GASTROINTESTINAL: [ ]   blood in stool  [ ]   hematemesis GENITOURINARY:  [ ]   dysuria  [ ]   hematuria PSYCHIATRIC:  [ ]  history of major depression INTEGUMENTARY:  [ ]  rashes  [ ]  ulcers CONSTITUTIONAL:  [ ]  fever   [ ]  chills  PHYSICAL EXAMINATION:  General: The patient is a well-nourished female, in no acute distress. Vital signs are BP 125/72  Pulse 53  Resp 18  Ht 5\' 8"  (1.727 m)  Wt  142 lb 1.6 oz (64.456 kg)  BMI 21.61 kg/m2 Pulmonary: There is a good air exchange bilaterally without wheezing or rales. Abdomen: Soft and non-tender with normal pitch bowel sounds. Musculoskeletal: There are no major deformities.  There is no significant extremity pain. Neurologic: No focal weakness or paresthesias are detected, Skin: There are no ulcer or rashes noted. Psychiatric: The patient has normal affect. Cardiovascular: There is a regular rate and rhythm without significant murmur appreciated. 2+ dorsalis pedis pulses bilaterally Lower extremities to have scattered telangiectasia bilaterally. Her area of concern does have varicosities over the area of her lateral distal calf above her lateral malleolus. She does have varicosities in this area.   Vascular Lab Studies:  She did not have a formal duplex today. I did reimage her veins with the SonoSite ultrasound. She does not appear to have any significant dilatation of her greater small saphenous vein. The area of concern appears to be related to perforator that arises in the mid calf from her anterior tibial vein and then tracts at the level of the fascia for several centimeters before the varicosities warm and her area of concern.   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 I explained that this is not a urinary increased risk of DVT or other more serious issues. I have explained the importance of elevation and compression and nonsteroidal anti-inflammatory use. We fitted her today with thigh high 20-30 mm mercury graduated compression site and we will determine if this is giving her adequate symptom relief. We will see her again in 3 months for further discussion and formal venous duplex of her right leg. Feel that she would be a candidate for laser ablation of this vein and phlebectomy of the tributary varicosities are painful for her if she fails conservative treatment    Karly Pitter [facility] [place] Office: [phone]

## 2012-11-13 ENCOUNTER — Other Ambulatory Visit: Payer: Self-pay | Admitting: *Deleted

## 2012-11-13 DIAGNOSIS — I83893 Varicose veins of bilateral lower extremities with other complications: Secondary | ICD-10-CM

## 2012-11-13 DIAGNOSIS — M79609 Pain in unspecified limb: Secondary | ICD-10-CM

## 2012-11-16 ENCOUNTER — Encounter: Payer: 59 | Admitting: Vascular Surgery

## 2012-12-25 ENCOUNTER — Other Ambulatory Visit: Payer: Self-pay | Admitting: *Deleted

## 2012-12-25 DIAGNOSIS — I83893 Varicose veins of bilateral lower extremities with other complications: Secondary | ICD-10-CM

## 2012-12-26 ENCOUNTER — Other Ambulatory Visit: Payer: Self-pay

## 2012-12-26 ENCOUNTER — Encounter: Payer: Self-pay | Admitting: Vascular Surgery

## 2012-12-27 ENCOUNTER — Encounter: Payer: Self-pay | Admitting: Vascular Surgery

## 2012-12-27 ENCOUNTER — Encounter (INDEPENDENT_AMBULATORY_CARE_PROVIDER_SITE_OTHER): Payer: 59 | Admitting: *Deleted

## 2012-12-27 ENCOUNTER — Ambulatory Visit (INDEPENDENT_AMBULATORY_CARE_PROVIDER_SITE_OTHER): Payer: 59 | Admitting: Vascular Surgery

## 2012-12-27 VITALS — BP 118/70 | HR 57 | Resp 14 | Ht 68.0 in | Wt 138.0 lb

## 2012-12-27 DIAGNOSIS — M79609 Pain in unspecified limb: Secondary | ICD-10-CM

## 2012-12-27 DIAGNOSIS — I83893 Varicose veins of bilateral lower extremities with other complications: Secondary | ICD-10-CM

## 2012-12-27 NOTE — Progress Notes (Signed)
The patient presents today for continued discussion regarding pain over a nest of varicosities in her right lateral pretibial area. She reports that she has had no symptom relief from her compression garments. She does elevate her legs when possible. She stands a great deal during work and reports that in the fall this will be increased amount of standing with work. He does have a significant discomfort over this area and below it onto her ankle related to the venous hypertension  Past Medical History  Diagnosis Date  . Schatzki's ring   . GERD (gastroesophageal reflux disease)   . Arthritis   . Asthma   . Allergy   . Colon polyps   . Constipation   . Spinal stenosis   . Seasonal affective disorder   . IBS (irritable bowel syndrome)   . Thyroid disease     Hypo  . Osteopenia 2007    T score -1.4    History  Substance Use Topics  . Smoking status: Former Smoker    Quit date: 05/23/1974  . Smokeless tobacco: Never Used  . Alcohol Use: 4.2 oz/week    7 Glasses of wine per week    Family History  Problem Relation Age of Onset  . Stroke Mother   . Osteoporosis Mother   . Heart disease Father   . Hyperlipidemia Father   . Lung cancer Father     No Known Allergies  Current outpatient prescriptions:celecoxib (CELEBREX) 200 MG capsule, Take 1 capsule (200 mg total) by mouth daily., Disp: 90 capsule, Rfl: 3;  CHONDROITIN SULFATE PO, Take 600 mg by mouth 4 (four) times daily.  , Disp: , Rfl: ;  Digestive Enzymes (ENZYME DIGEST) CAPS, Take by mouth., Disp: , Rfl: ;  lubiprostone (AMITIZA) 24 MCG capsule, Take 24 mcg by mouth 2 (two) times daily with a meal.  , Disp: , Rfl:  MAGNESIUM ASPARTATE PO, Take by mouth at bedtime., Disp: , Rfl: ;  Multiple Vitamin (MULTIVITAMIN) tablet, Take 1 tablet by mouth daily.  , Disp: , Rfl: ;  omega-3 acid ethyl esters (LOVAZA) 1 G capsule, Take 2 capsules (2 g total) by mouth 2 (two) times daily., Disp: 180 capsule, Rfl: 3;  Probiotic Product (PROBIOTIC  DAILY PO), Take by mouth., Disp: , Rfl:  SYNTHROID 50 MCG tablet, Take 1 tablet (50 mcg total) by mouth daily., Disp: 90 tablet, Rfl: 1;  VENTOLIN HFA 108 (90 BASE) MCG/ACT inhaler, , Disp: , Rfl:   BP 118/70  Pulse 57  Resp 14  Ht 5\' 8"  (1.727 m)  Wt 138 lb (62.596 kg)  BMI 20.99 kg/m2  SpO2 100%  Body mass index is 20.99 kg/(m^2).       On physical exam well-developed well-nourished white female in no acute distress. She does have palpable dorsalis pedis pulse. She does have a nest of varicosities over the lateral tibial area on her calf. She does not have any significant swelling.  She did undergo formal the venous duplex today in our office. This does show some reflux in her deep vein. She has minimal reflux in her saphenous vein. On imaging specifically over the varicosities she does have a perforator that extends down towards the anterior tibial neurovascular bundle. This then gives rise to the varicosities that are causing her pain.  Impression and plan: Failed conservative treatment of pain related to venous hypertension. I explained that this was due to perforator incompetence. I have recommended laser ablation of her perforator and stab phlebectomy of the varicosities  below this. I explained to be an outpatient procedure under local anesthesia. She understands and wishes to proceed as soon as possible.

## 2013-02-12 ENCOUNTER — Ambulatory Visit: Payer: 59 | Admitting: Vascular Surgery

## 2013-02-25 ENCOUNTER — Encounter: Payer: Self-pay | Admitting: Vascular Surgery

## 2013-02-26 ENCOUNTER — Encounter: Payer: Self-pay | Admitting: Vascular Surgery

## 2013-02-26 ENCOUNTER — Ambulatory Visit (INDEPENDENT_AMBULATORY_CARE_PROVIDER_SITE_OTHER): Payer: 59 | Admitting: Vascular Surgery

## 2013-02-26 VITALS — BP 111/66 | HR 55 | Ht 68.0 in | Wt 142.0 lb

## 2013-02-26 DIAGNOSIS — I83893 Varicose veins of bilateral lower extremities with other complications: Secondary | ICD-10-CM

## 2013-02-26 DIAGNOSIS — M79609 Pain in unspecified limb: Secondary | ICD-10-CM

## 2013-02-26 NOTE — Progress Notes (Signed)
Problems with Activities of Daily Living Secondary to Leg Pain  1. 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  She states performing her job duties is difficult due to leg pain.  2. Chelsea Cantrell states that she has difficulty sleeping at night due to leg pain.  3. Chelsea Cantrell states that she has difficulty exercising due to leg pain.   Failure of  Conservative Therapy:  1. Worn 20-30 mm Hg thigh high compression hose >3 months with no relief of symptoms.  2. Frequently elevates legs-no relief of symptoms  3. Taken Ibuprofen 600 Mg TID with no relief of symptoms.  The patient presents today for continued followup of her right leg painful varicosities. She has been compliant with compression garment and also elevation. She continues to have discomfort with prolonged standing over a nest of varicosities over her lateral pretibial area. Her formal duplex at her last visit showed no evidence of saphenous vein reflux. On imaging this nest of symptomatic varicosities, they do a rise from a perforator over the anterior tibial neurovascular bundle.  I felt to that she has failed conservative therapy. I have recommended laser ablation of her perforator which is causing these painful varicosities. I also recommended stab phlebectomy of the painful varicosities themselves. I explained this is an outpatient procedure under local anesthesia with a slight risk of deep venous injury. The patient understands and wished to proceed as soon as possible

## 2013-03-12 ENCOUNTER — Other Ambulatory Visit: Payer: 59 | Admitting: Internal Medicine

## 2013-03-12 DIAGNOSIS — Z130 Encounter for screening for diseases of the blood and blood-forming organs and certain disorders involving the immune mechanism: Secondary | ICD-10-CM

## 2013-03-12 DIAGNOSIS — Z0000 Encounter for general adult medical examination without abnormal findings: Secondary | ICD-10-CM

## 2013-03-12 DIAGNOSIS — Z13220 Encounter for screening for lipoid disorders: Secondary | ICD-10-CM

## 2013-03-12 DIAGNOSIS — Z1329 Encounter for screening for other suspected endocrine disorder: Secondary | ICD-10-CM

## 2013-03-12 LAB — COMPREHENSIVE METABOLIC PANEL
ALT: 22 U/L (ref 0–35)
Albumin: 4.6 g/dL (ref 3.5–5.2)
Alkaline Phosphatase: 46 U/L (ref 39–117)
CO2: 26 mEq/L (ref 19–32)
Glucose, Bld: 94 mg/dL (ref 70–99)
Potassium: 4.3 mEq/L (ref 3.5–5.3)
Sodium: 140 mEq/L (ref 135–145)
Total Protein: 6.3 g/dL (ref 6.0–8.3)

## 2013-03-12 LAB — CBC WITH DIFFERENTIAL/PLATELET
Basophils Absolute: 0.1 10*3/uL (ref 0.0–0.1)
Basophils Relative: 1 % (ref 0–1)
Eosinophils Absolute: 0.2 10*3/uL (ref 0.0–0.7)
Eosinophils Relative: 4 % (ref 0–5)
Lymphs Abs: 1.1 10*3/uL (ref 0.7–4.0)
MCH: 30.3 pg (ref 26.0–34.0)
MCV: 85.9 fL (ref 78.0–100.0)
Neutrophils Relative %: 69 % (ref 43–77)
Platelets: 294 10*3/uL (ref 150–400)
RBC: 4.33 MIL/uL (ref 3.87–5.11)
RDW: 13.2 % (ref 11.5–15.5)

## 2013-03-12 LAB — LIPID PANEL
HDL: 85 mg/dL (ref >39)
Total CHOL/HDL Ratio: 2.4 Ratio
VLDL: 13 mg/dL (ref 0–40)

## 2013-03-12 LAB — TSH: TSH: 1.673 u[IU]/mL (ref 0.350–4.500)

## 2013-03-14 ENCOUNTER — Ambulatory Visit (INDEPENDENT_AMBULATORY_CARE_PROVIDER_SITE_OTHER): Payer: 59 | Admitting: Internal Medicine

## 2013-03-14 ENCOUNTER — Encounter: Payer: Self-pay | Admitting: Internal Medicine

## 2013-03-14 VITALS — BP 120/70 | HR 60 | Temp 97.8°F | Ht 67.5 in | Wt 139.0 lb

## 2013-03-14 DIAGNOSIS — Z23 Encounter for immunization: Secondary | ICD-10-CM

## 2013-03-14 DIAGNOSIS — E785 Hyperlipidemia, unspecified: Secondary | ICD-10-CM

## 2013-03-14 DIAGNOSIS — K5909 Other constipation: Secondary | ICD-10-CM

## 2013-03-14 DIAGNOSIS — IMO0001 Reserved for inherently not codable concepts without codable children: Secondary | ICD-10-CM

## 2013-03-14 DIAGNOSIS — M7918 Myalgia, other site: Secondary | ICD-10-CM

## 2013-03-14 DIAGNOSIS — Z789 Other specified health status: Secondary | ICD-10-CM

## 2013-03-14 DIAGNOSIS — E039 Hypothyroidism, unspecified: Secondary | ICD-10-CM

## 2013-03-14 DIAGNOSIS — Z0000 Encounter for general adult medical examination without abnormal findings: Secondary | ICD-10-CM

## 2013-03-14 DIAGNOSIS — F39 Unspecified mood [affective] disorder: Secondary | ICD-10-CM

## 2013-03-14 DIAGNOSIS — K5900 Constipation, unspecified: Secondary | ICD-10-CM

## 2013-03-14 DIAGNOSIS — K219 Gastro-esophageal reflux disease without esophagitis: Secondary | ICD-10-CM

## 2013-03-14 DIAGNOSIS — J309 Allergic rhinitis, unspecified: Secondary | ICD-10-CM

## 2013-03-14 DIAGNOSIS — M519 Unspecified thoracic, thoracolumbar and lumbosacral intervertebral disc disorder: Secondary | ICD-10-CM

## 2013-03-14 DIAGNOSIS — Z86010 Personal history of colonic polyps: Secondary | ICD-10-CM

## 2013-03-14 DIAGNOSIS — F338 Other recurrent depressive disorders: Secondary | ICD-10-CM

## 2013-03-14 DIAGNOSIS — J45990 Exercise induced bronchospasm: Secondary | ICD-10-CM

## 2013-03-14 LAB — POCT URINALYSIS DIPSTICK
Blood, UA: NEGATIVE
Ketones, UA: NEGATIVE
Leukocytes, UA: NEGATIVE
Protein, UA: NEGATIVE
Urobilinogen, UA: NEGATIVE
pH, UA: 7

## 2013-03-14 MED ORDER — ICOSAPENT ETHYL 1 G PO CAPS: 2.0000 | ORAL_CAPSULE | Freq: Two times a day (BID) | ORAL | Status: DC

## 2013-03-14 MED ORDER — SYNTHROID 50 MCG PO TABS: 50.0000 ug | ORAL_TABLET | Freq: Every day | ORAL | Status: DC

## 2013-03-14 MED ORDER — CELECOXIB 200 MG PO CAPS: 200.0000 mg | ORAL_CAPSULE | Freq: Every day | ORAL | Status: DC

## 2013-04-15 ENCOUNTER — Telehealth: Payer: Self-pay | Admitting: *Deleted

## 2013-04-15 NOTE — Telephone Encounter (Signed)
Left voice message that [facility] has upheld the denial of CPT code 16109.  Went through appeals process . Chelsea Gip MD had formal (telephone) peer-to-peer review with Chelsea Laine MD [facility]).  Dr. Francesco Cantrell upheld the denial of CPT [phone] perforator right anterior tibial vein.  [employer] policy allows treatment of perforators only in treatment of skin ulcers near the veins.  Chelsea Cantrell has no skin ulcers.

## 2013-05-08 ENCOUNTER — Ambulatory Visit (INDEPENDENT_AMBULATORY_CARE_PROVIDER_SITE_OTHER): Payer: 59 | Admitting: Internal Medicine

## 2013-05-08 DIAGNOSIS — Z23 Encounter for immunization: Secondary | ICD-10-CM

## 2013-07-19 NOTE — Patient Instructions (Signed)
Continue same medications and return in 6 months 

## 2013-07-19 NOTE — Progress Notes (Signed)
Subjective:    Patient ID: Chelsea Cantrell, female    DOB: [birth year], 68 y.o.   MRN: [id number]  HPI 68 year old white female in today for health maintenance and evaluation of medical issues. [facility] is GYN physician. Flu vaccine given today.  Patient has history of hypothyroidism, GE reflux, exercise-induced asthma, history of Schatzki's ring, allergic rhinitis, hyperplastic colon polyps, estrogen replacement, constipation, history of spinal stenosis, seasonal affective disorder.  Past medical history: Previously seen by Dr. Earlie Counts. Also evaluated at [place]. Takes allergy injections. History of allergies to cats dogs and pollen. Dr. Vincente Poli records indicate she may have some attention deficit issues and has been tried on Strattera in 2003 but currently does not take anything for attention deficit. Dr. Earlean Shawl did colonoscopy 03/25/2010. Has seen Dr. Nelva Bush  in the past for epidural steroid injections in 2011 with history of multilevel degenerative disc disease at L3-L4 and L5-S1. Has taken Lovaza for hyperlipidemia for some time. Take Amitiza for  chronic constipation. Takes Celebrex and chondroitin sulfate for musculoskeletal pain.  Left radius fracture approximately 2000. Schatzki's ring dilated December 2010. Left foot surgery on second third and fourth metatarsals 2008. History of right hip resurfacing 2001.  Had Pneumovax in 2008. Tetanus immunization 2008.  No known drug allergies but intolerant to generic Wellbutrin  Social history: Has never been married and is single. No children. She is an Programme researcher, broadcasting/film/video for [employer]. She exercises routinely. Currently nonsmoker. She did smoke prior to 1976. Does not drink coffee. Is a vegetarian. Drinks one half gr and one half black tea. 2 glasses of wine at night.  Family history: Father died at 8 of lung cancer with history of MI at age 22 and high cholesterol. Mother died at age 24 but had a stroke 4 years before  she passed away. One brother in good health. One sister in good health.  Bone density study done through GYN office.    Review of Systems  HENT: Negative.   Eyes: Negative.   Respiratory: Negative.   Cardiovascular: Negative.   Gastrointestinal:       Chronic constipation  Endocrine:       Hypothyroidism  Musculoskeletal:       Musculoskeletal pain  Allergic/Immunologic: Positive for environmental allergies.  Psychiatric/Behavioral:       History of seasonal affective disorder       Objective:   Physical Exam  Vitals reviewed. Constitutional: She is oriented to person, place, and time. She appears well-developed and well-nourished. No distress.  HENT:  Head: Normocephalic and atraumatic.  Right Ear: External ear normal.  Left Ear: External ear normal.  Mouth/Throat: Oropharynx is clear and moist. No oropharyngeal exudate.  Eyes: Conjunctivae and EOM are normal. Pupils are equal, round, and reactive to light. Right eye exhibits no discharge. Left eye exhibits no discharge. No scleral icterus.  Neck: Neck supple. No JVD present. No thyromegaly present.  Cardiovascular: Normal rate, regular rhythm, normal heart sounds and intact distal pulses.   No murmur heard. Pulmonary/Chest: Effort normal and breath sounds normal. No respiratory distress. She has no wheezes. She has no rales.  Abdominal: Soft. Bowel sounds are normal. She exhibits no distension and no mass. There is no rebound and no guarding.  Genitourinary:  Deferred to GYN  Musculoskeletal: Normal range of motion. She exhibits no edema.  Lymphadenopathy:    She has no cervical adenopathy.  Neurological: She is alert and oriented to person, place, and time. She has normal reflexes. No cranial nerve  deficit. Coordination normal.  Skin: Skin is warm and dry. No rash noted. She is not diaphoretic.  Psychiatric: She has a normal mood and affect. Judgment and thought content normal.          Assessment & Plan:    Osteoarthritis controlled with Celebrex and chondroitin sulfate  GE reflux  History of exercise-induced asthma-uses inhaler before exercise  History of hyperplastic colon polyps  History of spinal stenosis and lumbar degenerative disc disease previously treated with epidural steroids  Currently not on estrogen replacement  History of Schatzki's ring 2010  Vegetarian diet  Hyperlipidemia  History of hip resurfacing 2001  History of chronic constipation treated with Amitiza  History of allergic rhinitis  History of seasonal affective disorder  Plan: Return in 6 months for office visit and TSH. Continue same medications.

## 2013-09-16 ENCOUNTER — Other Ambulatory Visit: Payer: 59 | Admitting: Internal Medicine

## 2013-09-16 DIAGNOSIS — E039 Hypothyroidism, unspecified: Secondary | ICD-10-CM

## 2013-09-16 DIAGNOSIS — E785 Hyperlipidemia, unspecified: Secondary | ICD-10-CM

## 2013-09-16 LAB — LIPID PANEL
Cholesterol: 202 mg/dL — ABNORMAL HIGH (ref 0–200)
HDL: 75 mg/dL (ref >39)
LDL CALC: 114 mg/dL — AB (ref 0–99)
Total CHOL/HDL Ratio: 2.7 Ratio
Triglycerides: 63 mg/dL (ref <150)
VLDL: 13 mg/dL (ref 0–40)

## 2013-09-16 LAB — TSH: TSH: 0.217 u[IU]/mL — ABNORMAL LOW (ref 0.350–4.500)

## 2013-09-17 ENCOUNTER — Encounter: Payer: Self-pay | Admitting: Internal Medicine

## 2013-09-17 ENCOUNTER — Ambulatory Visit (INDEPENDENT_AMBULATORY_CARE_PROVIDER_SITE_OTHER): Payer: 59 | Admitting: Internal Medicine

## 2013-09-17 VITALS — BP 108/70 | HR 58 | Temp 98.1°F | Ht 67.5 in | Wt 143.0 lb

## 2013-09-17 DIAGNOSIS — E7800 Pure hypercholesterolemia, unspecified: Secondary | ICD-10-CM

## 2013-09-17 DIAGNOSIS — K589 Irritable bowel syndrome without diarrhea: Secondary | ICD-10-CM

## 2013-09-17 DIAGNOSIS — E039 Hypothyroidism, unspecified: Secondary | ICD-10-CM

## 2013-09-17 DIAGNOSIS — K5900 Constipation, unspecified: Secondary | ICD-10-CM

## 2013-09-17 NOTE — Progress Notes (Signed)
   Subjective:    Patient ID: Chelsea Cantrell, female    DOB: 20-Dec-1945, 68 y.o.   MRN: [id number]  HPI Here  again in today for six-month recheck of hypothyroidism, history of bronchospasm, irritable bowel syndrome, history of constipation. Has appointment to see Dr. Earlean Shawl in the near future regarding Linzess. Doesn't feel it works as well as LFTs. With regard to hyperlipidemia, LDL is slightly elevated at 114 compared to previous study. TSH is low for some unknown reason and I suspect it may be lab error. She's had consistently normal and stable TSHs for the last several years. I'm not going to change dose of Synthroid at this point in time and we will reassess in 6 months. No other complaints or problems today.    Review of Systems     Objective:   Physical Exam Chest clear to auscultation. Cardiac exam regular rate and rhythm. Extremities without edema. No thyromegaly       Assessment & Plan:  Hypothyroidism-continue same dose of Synthroid and reassess in 6 months  Mild elevation of LDL cholesterol-continue diet and exercise  History of constipation  History of irritable bowel syndrome  History of exercise-induced bronchospasm-has inhaler on pain  Osteoarthritis-treated with Celebrex  Plan: Physical exam in 6 months

## 2013-09-17 NOTE — Patient Instructions (Signed)
Continue same meds and return in 6 months 

## 2013-12-30 ENCOUNTER — Other Ambulatory Visit: Payer: Self-pay | Admitting: Internal Medicine

## 2014-01-02 ENCOUNTER — Other Ambulatory Visit: Payer: Self-pay | Admitting: Internal Medicine

## 2014-03-20 ENCOUNTER — Other Ambulatory Visit: Payer: 59 | Admitting: Internal Medicine

## 2014-03-20 DIAGNOSIS — Z13220 Encounter for screening for lipoid disorders: Secondary | ICD-10-CM

## 2014-03-20 DIAGNOSIS — Z1329 Encounter for screening for other suspected endocrine disorder: Secondary | ICD-10-CM

## 2014-03-20 DIAGNOSIS — E039 Hypothyroidism, unspecified: Secondary | ICD-10-CM

## 2014-03-20 DIAGNOSIS — Z130 Encounter for screening for diseases of the blood and blood-forming organs and certain disorders involving the immune mechanism: Secondary | ICD-10-CM

## 2014-03-20 DIAGNOSIS — Z1321 Encounter for screening for nutritional disorder: Secondary | ICD-10-CM

## 2014-03-20 DIAGNOSIS — Z0000 Encounter for general adult medical examination without abnormal findings: Secondary | ICD-10-CM

## 2014-03-20 DIAGNOSIS — E785 Hyperlipidemia, unspecified: Secondary | ICD-10-CM

## 2014-03-20 LAB — COMPREHENSIVE METABOLIC PANEL
ALT: 20 U/L (ref 0–35)
AST: 30 U/L (ref 0–37)
Albumin: 4.2 g/dL (ref 3.5–5.2)
Alkaline Phosphatase: 58 U/L (ref 39–117)
BUN: 13 mg/dL (ref 6–23)
CALCIUM: 8.9 mg/dL (ref 8.4–10.5)
CHLORIDE: 105 meq/L (ref 96–112)
CO2: 25 meq/L (ref 19–32)
Creat: 0.83 mg/dL (ref 0.50–1.10)
Glucose, Bld: 85 mg/dL (ref 70–99)
Potassium: 4.2 mEq/L (ref 3.5–5.3)
Sodium: 140 mEq/L (ref 135–145)
Total Bilirubin: 0.7 mg/dL (ref 0.2–1.2)
Total Protein: 6 g/dL (ref 6.0–8.3)

## 2014-03-20 LAB — CBC WITH DIFFERENTIAL/PLATELET
BASOS PCT: 1 % (ref 0–1)
Basophils Absolute: 0 10*3/uL (ref 0.0–0.1)
EOS ABS: 0.4 10*3/uL (ref 0.0–0.7)
Eosinophils Relative: 8 % — ABNORMAL HIGH (ref 0–5)
HCT: 37.6 % (ref 36.0–46.0)
Hemoglobin: 13.2 g/dL (ref 12.0–15.0)
LYMPHS ABS: 1.3 10*3/uL (ref 0.7–4.0)
Lymphocytes Relative: 27 % (ref 12–46)
MCH: 30.3 pg (ref 26.0–34.0)
MCHC: 35.1 g/dL (ref 30.0–36.0)
MCV: 86.2 fL (ref 78.0–100.0)
Monocytes Absolute: 0.4 10*3/uL (ref 0.1–1.0)
Monocytes Relative: 8 % (ref 3–12)
Neutro Abs: 2.7 10*3/uL (ref 1.7–7.7)
Neutrophils Relative %: 56 % (ref 43–77)
PLATELETS: 306 10*3/uL (ref 150–400)
RBC: 4.36 MIL/uL (ref 3.87–5.11)
RDW: 12.9 % (ref 11.5–15.5)
WBC: 4.8 10*3/uL (ref 4.0–10.5)

## 2014-03-20 LAB — LIPID PANEL
CHOL/HDL RATIO: 2.7 ratio
CHOLESTEROL: 201 mg/dL — AB (ref 0–200)
HDL: 75 mg/dL (ref >39)
LDL Cholesterol: 113 mg/dL — ABNORMAL HIGH (ref 0–99)
TRIGLYCERIDES: 64 mg/dL (ref <150)
VLDL: 13 mg/dL (ref 0–40)

## 2014-03-20 LAB — TSH: TSH: 2.42 u[IU]/mL (ref 0.350–4.500)

## 2014-03-21 ENCOUNTER — Encounter: Payer: 59 | Admitting: Internal Medicine

## 2014-03-21 LAB — VITAMIN D 25 HYDROXY (VIT D DEFICIENCY, FRACTURES): Vit D, 25-Hydroxy: 53 ng/mL (ref 30–89)

## 2014-03-24 ENCOUNTER — Encounter: Payer: Self-pay | Admitting: Internal Medicine

## 2014-04-28 ENCOUNTER — Telehealth: Payer: Self-pay

## 2014-04-28 NOTE — Telephone Encounter (Signed)
Left message for patient to call and let us know if they have received flu vaccine.

## 2014-05-07 ENCOUNTER — Ambulatory Visit (INDEPENDENT_AMBULATORY_CARE_PROVIDER_SITE_OTHER): Payer: 59 | Admitting: Internal Medicine

## 2014-05-07 DIAGNOSIS — Z23 Encounter for immunization: Secondary | ICD-10-CM

## 2014-05-20 ENCOUNTER — Ambulatory Visit (INDEPENDENT_AMBULATORY_CARE_PROVIDER_SITE_OTHER): Payer: 59 | Admitting: Physician Assistant

## 2014-05-20 VITALS — BP 124/60 | HR 89 | Temp 101.7°F | Resp 18 | Ht 67.75 in | Wt 142.4 lb

## 2014-05-20 DIAGNOSIS — R509 Fever, unspecified: Secondary | ICD-10-CM

## 2014-05-20 DIAGNOSIS — R6883 Chills (without fever): Secondary | ICD-10-CM

## 2014-05-20 LAB — POCT INFLUENZA A/B
INFLUENZA B, POC: NEGATIVE
Influenza A, POC: NEGATIVE

## 2014-05-20 NOTE — Progress Notes (Signed)
Subjective:    Patient ID: Chelsea Cantrell, female    DOB: 25-Sep-1945, 68 y.o.   MRN: [id number]  PCP: Elby Showers, MD  Chief Complaint  Patient presents with  . Possible Flu    fatigue and body aches x1 day   Patient Active Problem List   Diagnosis Date Noted  . Pain in limb 12/27/2012  . Varicose veins of lower extremities with other complications 11/13/7626  . IBS (irritable bowel syndrome)   . Hyperplastic colon polyp 03/12/2012  . GE reflux 03/27/2011  . Osteoarthritis 03/27/2011  . Exercise-induced asthma 03/27/2011  . Allergic rhinitis 03/27/2011  . Hypothyroidism 03/27/2011  . Spinal stenosis 03/27/2011  . Seasonal affective disorder 03/27/2011   Prior to Admission medications   Medication Sig Start Date End Date Taking? Authorizing Provider  celecoxib (CELEBREX) 200 MG capsule Take 1 capsule (200 mg total) by mouth daily. 03/14/13  Yes Elby Showers, MD  CHONDROITIN SULFATE PO Take 600 mg by mouth 4 (four) times daily.     Yes Historical Provider, MD  Digestive Enzymes (ENZYME DIGEST) CAPS Take by mouth.   Yes Historical Provider, MD  Rolan Lipa 290 MCG CAPS capsule  03/19/14  Yes Historical Provider, MD  MAGNESIUM ASPARTATE PO Take by mouth at bedtime.   Yes Historical Provider, MD  Multiple Vitamin (MULTIVITAMIN) tablet Take 1 tablet by mouth daily.     Yes Historical Provider, MD  SYNTHROID 50 MCG tablet Take 1 tablet by mouth  daily 01/02/14  Yes Elby Showers, MD  VASCEPA 1 G CAPS Take 2 capsules by mouth  twice daily at noon and 4PM 12/30/13  Yes Elby Showers, MD  VENTOLIN HFA 108 (90 BASE) MCG/ACT inhaler Inhale 2 puffs into the lungs every 6 (six) hours as needed.  02/16/12  Yes Historical Provider, MD   Medications, allergies, past medical history, surgical history, family history, social history and problem list reviewed and updated.  HPI  46 yof with pmh ibs, exercise induced asthma, and hypothyroidism presents with fatigue, body aches that started yest.     Was having lunch yest and felt somewhat nauseated. Took tums with no relief. Went home and went to bed for remainder of evening. This am when she awoke she initially felt ok. Attempted to exercise at gym but when she got there had no energy so went home and sleep. Has had fatigue and chills through much of the day today. Mild HA throughout day today but relieved right now. Has had body aches all over. Has not checked temp at home. Ate few pieces popcorn this am and kept them down.   She received flu vaccine 12 days ago per pt.   Denies otalgia, sore throat, cough, abd pain, n/v, diarrhea, dysuria, urinary freq or urgency.   Review of Systems No CP, SOB.     Objective:   Physical Exam  Constitutional: She is oriented to person, place, and time. She appears well-developed and well-nourished.  Non-toxic appearance. She does not have a sickly appearance. She appears ill. No distress.  BP 124/60 mmHg  Pulse 89  Temp(Src) 101.7 F (38.7 C) (Oral)  Resp 18  Ht 5' 7.75" (1.721 m)  Wt 142 lb 6.4 oz (64.592 kg)  BMI 21.81 kg/m2  SpO2 97%   HENT:  Right Ear: Tympanic membrane normal.  Left Ear: Tympanic membrane normal.  Nose: Nose normal.  Mouth/Throat: Uvula is midline, oropharynx is clear and moist and mucous membranes are normal. No oropharyngeal exudate, posterior  oropharyngeal edema, posterior oropharyngeal erythema or tonsillar abscesses.  Neck: No Brudzinski's sign noted.  Cardiovascular: Normal rate, regular rhythm and normal heart sounds.  Exam reveals no gallop.   No murmur heard. Pulmonary/Chest: Effort normal and breath sounds normal. No tachypnea. No respiratory distress. She has no decreased breath sounds. She has no wheezes. She has no rhonchi. She has no rales.  Lymphadenopathy:       Head (right side): No submental, no submandibular and no tonsillar adenopathy present.       Head (left side): No submental, no submandibular and no tonsillar adenopathy present.    She has  no cervical adenopathy.  Neurological: She is alert and oriented to person, place, and time.  Psychiatric: She has a normal mood and affect. Her speech is normal.   Results for orders placed or performed in visit on 05/20/14  POCT Influenza A/B  Result Value Ref Range   Influenza A, POC Negative    Influenza B, POC Negative        Assessment & Plan:   30 yof with pmh ibs, exercise induced asthma, and hypothyroidism presents with fatigue, body aches that started yest.   Fever, unspecified fever cause - Plan: POCT Influenza A/B Chills - Plan: POCT Influenza A/B --fever, aches, fatigue --flu swab negative, exam benign, no abx indicated at this time --fever today --rest/fluids/ibuprofen prn --tamiflu offered as pt has flu-like illness but she declines --rtc/er instructions given  Julieta Gutting, PA-C Physician Assistant-Certified [facility]  05/20/2014 4:53 PM

## 2014-05-20 NOTE — Patient Instructions (Signed)
Your exam was normal today. You did have a fever in clinic today.  Your flu swab was negative today. Please be sure to get rest and drink of fluids over the next few days. Please stop taking the celebrex and take ibuprofen every 4-6 hours as needed for aches and fever.  If you're not feeling better in 3-4 days, if you develop a productive cough, if your fevers persist, or if you start to feel worse please return to clinic or go to the ED.

## 2014-05-26 ENCOUNTER — Emergency Department (HOSPITAL_COMMUNITY): Payer: 59

## 2014-05-26 ENCOUNTER — Encounter (HOSPITAL_COMMUNITY): Payer: Self-pay | Admitting: Emergency Medicine

## 2014-05-26 ENCOUNTER — Emergency Department (HOSPITAL_COMMUNITY)
Admission: EM | Admit: 2014-05-26 | Discharge: 2014-05-26 | Disposition: A | Discharge status: Home / Self Care | Payer: 59 | Attending: Emergency Medicine | Admitting: Emergency Medicine

## 2014-05-26 DIAGNOSIS — K219 Gastro-esophageal reflux disease without esophagitis: Secondary | ICD-10-CM | POA: Insufficient documentation

## 2014-05-26 DIAGNOSIS — R0602 Shortness of breath: Secondary | ICD-10-CM | POA: Diagnosis present

## 2014-05-26 DIAGNOSIS — Z87891 Personal history of nicotine dependence: Secondary | ICD-10-CM | POA: Diagnosis not present

## 2014-05-26 DIAGNOSIS — Z86010 Personal history of colonic polyps: Secondary | ICD-10-CM | POA: Diagnosis not present

## 2014-05-26 DIAGNOSIS — M1990 Unspecified osteoarthritis, unspecified site: Secondary | ICD-10-CM | POA: Diagnosis not present

## 2014-05-26 DIAGNOSIS — Z87798 Personal history of other (corrected) congenital malformations: Secondary | ICD-10-CM | POA: Insufficient documentation

## 2014-05-26 DIAGNOSIS — Z79899 Other long term (current) drug therapy: Secondary | ICD-10-CM | POA: Insufficient documentation

## 2014-05-26 DIAGNOSIS — R1011 Right upper quadrant pain: Secondary | ICD-10-CM | POA: Diagnosis not present

## 2014-05-26 DIAGNOSIS — R7989 Other specified abnormal findings of blood chemistry: Secondary | ICD-10-CM | POA: Diagnosis not present

## 2014-05-26 DIAGNOSIS — E039 Hypothyroidism, unspecified: Secondary | ICD-10-CM | POA: Insufficient documentation

## 2014-05-26 DIAGNOSIS — Z8659 Personal history of other mental and behavioral disorders: Secondary | ICD-10-CM | POA: Insufficient documentation

## 2014-05-26 DIAGNOSIS — J45909 Unspecified asthma, uncomplicated: Secondary | ICD-10-CM | POA: Diagnosis not present

## 2014-05-26 DIAGNOSIS — R110 Nausea: Secondary | ICD-10-CM | POA: Insufficient documentation

## 2014-05-26 DIAGNOSIS — R945 Abnormal results of liver function studies: Secondary | ICD-10-CM

## 2014-05-26 LAB — URINALYSIS, ROUTINE W REFLEX MICROSCOPIC
Glucose, UA: NEGATIVE mg/dL
Hgb urine dipstick: NEGATIVE
Ketones, ur: NEGATIVE mg/dL
Nitrite: NEGATIVE
PROTEIN: NEGATIVE mg/dL
Specific Gravity, Urine: 1.021 (ref 1.005–1.030)
Urobilinogen, UA: 1 mg/dL (ref 0.0–1.0)
pH: 6.5 (ref 5.0–8.0)

## 2014-05-26 LAB — URINE MICROSCOPIC-ADD ON

## 2014-05-26 LAB — CBC
HEMATOCRIT: 39.5 % (ref 36.0–46.0)
HEMOGLOBIN: 13.5 g/dL (ref 12.0–15.0)
MCH: 29.9 pg (ref 26.0–34.0)
MCHC: 34.2 g/dL (ref 30.0–36.0)
MCV: 87.6 fL (ref 78.0–100.0)
Platelets: 244 10*3/uL (ref 150–400)
RBC: 4.51 MIL/uL (ref 3.87–5.11)
RDW: 12.8 % (ref 11.5–15.5)
WBC: 7.3 10*3/uL (ref 4.0–10.5)

## 2014-05-26 LAB — HEPATIC FUNCTION PANEL
ALT: 90 U/L — ABNORMAL HIGH (ref 0–35)
AST: 142 U/L — AB (ref 0–37)
Albumin: 2.8 g/dL — ABNORMAL LOW (ref 3.5–5.2)
Alkaline Phosphatase: 491 U/L — ABNORMAL HIGH (ref 39–117)
BILIRUBIN DIRECT: 0.9 mg/dL — AB (ref 0.0–0.3)
BILIRUBIN INDIRECT: 0.8 mg/dL (ref 0.3–0.9)
Total Bilirubin: 1.7 mg/dL — ABNORMAL HIGH (ref 0.3–1.2)
Total Protein: 5.7 g/dL — ABNORMAL LOW (ref 6.0–8.3)

## 2014-05-26 LAB — BASIC METABOLIC PANEL
Anion gap: 13 (ref 5–15)
BUN: 14 mg/dL (ref 6–23)
CO2: 21 mmol/L (ref 19–32)
CREATININE: 1.13 mg/dL — AB (ref 0.50–1.10)
Calcium: 8.4 mg/dL (ref 8.4–10.5)
Chloride: 107 mEq/L (ref 96–112)
GFR, EST AFRICAN AMERICAN: 57 mL/min — AB (ref >90)
GFR, EST NON AFRICAN AMERICAN: 50 mL/min — AB (ref >90)
Glucose, Bld: 110 mg/dL — ABNORMAL HIGH (ref 70–99)
Potassium: 3.3 mmol/L — ABNORMAL LOW (ref 3.5–5.1)
Sodium: 141 mmol/L (ref 135–145)

## 2014-05-26 LAB — LIPASE, BLOOD: Lipase: 59 U/L (ref 11–59)

## 2014-05-26 MED ORDER — SODIUM CHLORIDE 0.9 % IV SOLN
1000.0000 mL | INTRAVENOUS | Status: DC
Start: 2014-05-26 — End: 2014-05-26
  Administered 2014-05-26: 1000 mL via INTRAVENOUS

## 2014-05-26 MED ORDER — CEPHALEXIN 500 MG PO CAPS: 500.0000 mg | ORAL_CAPSULE | Freq: Three times a day (TID) | ORAL | Status: DC

## 2014-05-26 MED ORDER — ONDANSETRON 8 MG PO TBDP: 8.0000 mg | ORAL_TABLET | Freq: Three times a day (TID) | ORAL | Status: DC | PRN

## 2014-05-26 MED ORDER — MORPHINE SULFATE 4 MG/ML IJ SOLN
4.0000 mg | Freq: Once | INTRAMUSCULAR | Status: AC
  Administered 2014-05-26: 4 mg via INTRAVENOUS
  Filled 2014-05-26: qty 1

## 2014-05-26 MED ORDER — SODIUM CHLORIDE 0.9 % IV SOLN
1000.0000 mL | Freq: Once | INTRAVENOUS | Status: AC
  Administered 2014-05-26: 1000 mL via INTRAVENOUS

## 2014-05-26 NOTE — ED Notes (Signed)
Pt. Is unable to use the restroom at this time, but is aware that we need a urine specimen.  

## 2014-05-26 NOTE — ED Provider Notes (Addendum)
CSN: [id number]     Arrival date & time 05/26/14  0848 History   First MD Initiated Contact with Patient 05/26/14 [phone]     Chief Complaint  Patient presents with  . Nausea  . Shortness of Breath      HPI Patient presents to the emergency department with persistent intermittent nausea and occasional exertional shortness of breath over the past week.  This has been also associated with intermittent fevers.  Was seen in urgent care on December 29 and found to have a fever 101.7 at that time with nausea.  At that time she had labs that flu test done which were negative.  She continues to feel generalized malaise.  She denies significant abdominal pain.  She denies productive cough.  No upper respiratory symptoms.  No rash.  No neck pain or stiffness.  She reports nausea without much vomiting.  She reports mild decreased oral intake.  She has generalized myalgias at this time.  No focal joint pain.  Denies urinary complaints.  No back pain or flank pain.   Past Medical History  Diagnosis Date  . Schatzki's ring   . GERD (gastroesophageal reflux disease)   . Arthritis   . Asthma   . Allergy   . Colon polyps   . Constipation   . Spinal stenosis   . Seasonal affective disorder   . IBS (irritable bowel syndrome)   . Thyroid disease     Hypo  . Osteopenia 2007    T score -1.4   Past Surgical History  Procedure Laterality Date  . Hip surgery Right 2001    Replacement  . Foot surgery Left   . Joint replacement     Family History  Problem Relation Age of Onset  . Stroke Mother   . Osteoporosis Mother   . Heart disease Father   . Hyperlipidemia Father   . Lung cancer Father    History  Substance Use Topics  . Smoking status: Former Smoker    Quit date: 05/23/1974  . Smokeless tobacco: Never Used  . Alcohol Use: 4.2 oz/week    7 Glasses of wine per week   OB History    Gravida Para Term Preterm AB TAB SAB Ectopic Multiple Living   1    1     0     Review of Systems  All  other systems reviewed and are negative.     Allergies  Review of patient's allergies indicates no known allergies.  Home Medications   Prior to Admission medications   Medication Sig Start Date End Date Taking? Authorizing Provider  CHONDROITIN SULFATE PO Take 600 mg by mouth daily.    Yes Historical Provider, MD  Digestive Enzymes (ENZYME DIGEST) CAPS Take 1 capsule by mouth daily.    Yes Historical Provider, MD  LINZESS 290 MCG CAPS capsule Take 145 mcg by mouth daily.  03/19/14  Yes Historical Provider, MD  MAGNESIUM ASPARTATE PO Take by mouth at bedtime.   Yes Historical Provider, MD  Multiple Vitamin (MULTIVITAMIN) tablet Take 1 tablet by mouth daily.     Yes Historical Provider, MD  SYNTHROID 50 MCG tablet Take 1 tablet by mouth  daily 01/02/14  Yes Elby Showers, MD  VASCEPA 1 G CAPS Take 2 capsules by mouth  twice daily at noon and 4PM 12/30/13  Yes Elby Showers, MD  VENTOLIN HFA 108 (90 BASE) MCG/ACT inhaler Inhale 2 puffs into the lungs every 6 (six) hours as needed.  02/16/12  Yes Historical Provider, MD  celecoxib (CELEBREX) 200 MG capsule Take 1 capsule (200 mg total) by mouth daily. Patient not taking: Reported on 05/26/2014 03/14/13   Elby Showers, MD   BP 100/51 mmHg  Pulse 96  Temp(Src) 98.3 F (36.8 C) (Oral)  Resp 18  SpO2 94% Physical Exam  Constitutional: She is oriented to person, place, and time. She appears well-developed and well-nourished. No distress.  HENT:  Head: Normocephalic and atraumatic.  Eyes: EOM are normal.  Neck: Normal range of motion.  Cardiovascular: Normal rate, regular rhythm and normal heart sounds.   Pulmonary/Chest: Effort normal and breath sounds normal.  Abdominal: Soft. She exhibits no distension.  Mild right upper quadrant tenderness without guarding or rebound.  Musculoskeletal: Normal range of motion.  Neurological: She is alert and oriented to person, place, and time.  Skin: Skin is warm and dry.  Psychiatric: She has a  normal mood and affect. Judgment normal.  Nursing note and vitals reviewed.   ED Course  Procedures (including critical care time) Labs Review Labs Reviewed  BASIC METABOLIC PANEL - Abnormal; Notable for the following:    Potassium 3.3 (*)    Glucose, Bld 110 (*)    Creatinine, Ser 1.13 (*)    GFR calc non Af Amer 50 (*)    GFR calc Af Amer 57 (*)    All other components within normal limits  HEPATIC FUNCTION PANEL - Abnormal; Notable for the following:    Total Protein 5.7 (*)    Albumin 2.8 (*)    AST 142 (*)    ALT 90 (*)    Alkaline Phosphatase 491 (*)    Total Bilirubin 1.7 (*)    Bilirubin, Direct 0.9 (*)    All other components within normal limits  URINALYSIS, ROUTINE W REFLEX MICROSCOPIC - Abnormal; Notable for the following:    Color, Urine AMBER (*)    APPearance CLOUDY (*)    Bilirubin Urine MODERATE (*)    Leukocytes, UA LARGE (*)    All other components within normal limits  URINE MICROSCOPIC-ADD ON - Abnormal; Notable for the following:    Bacteria, UA MANY (*)    Casts HYALINE CASTS (*)    All other components within normal limits  CBC  LIPASE, BLOOD  HEPATITIS PANEL, ACUTE    Imaging Review Dg Chest 2 View  05/26/2014   CLINICAL DATA:  Nausea and shortness of breath.  EXAM: CHEST - 2 VIEW  COMPARISON:  11/21/2008  FINDINGS: The heart size and mediastinal contours are within normal limits. There is no evidence of pulmonary edema, consolidation, pneumothorax, nodule or pleural fluid. The visualized skeletal structures are unremarkable.  IMPRESSION: No active disease.   Electronically Signed   By: Aletta Edouard M.D.   On: 05/26/2014 09:34   US Abdomen Complete  05/26/2014   CLINICAL DATA:  Right upper quadrant abdominal pain for 1 week.  EXAM: ULTRASOUND ABDOMEN COMPLETE  COMPARISON:  None.  FINDINGS: Gallbladder: No gallstones or wall thickening visualized. No sonographic Murphy sign noted.  Common bile duct: Diameter: 5 mm  Liver: No focal lesion  identified. Within normal limits in parenchymal echogenicity.  IVC: No abnormality visualized.  Pancreas: Visualized portion unremarkable.  Spleen: Size and appearance within normal limits.  Right Kidney: Length: 10.7 cm. Echogenicity within normal limits. No mass or hydronephrosis visualized.  Left Kidney: Length: 10.1 cm. Echogenicity within normal limits. No mass or hydronephrosis visualized.  Abdominal aorta: No aneurysm visualized.  Other  findings: None.  IMPRESSION: Negative abdominal ultrasound.   Electronically Signed   By: Earle Gell M.D.   On: 05/26/2014 11:11  I personally reviewed the imaging tests through PACS system I reviewed available ER/hospitalization records through the EMR    EKG Interpretation   Date/Time:  Monday May 26 2014 08:56:51 EST Ventricular Rate:  108 PR Interval:  131 QRS Duration: 75 QT Interval:  347 QTC Calculation: 465 R Axis:   84 Text Interpretation:  Sinus tachycardia Probable left atrial enlargement  Borderline right axis deviation No old tracing to compare Confirmed by  Rashidi Loh  MD, Lennette Bihari ([id number]) on 05/26/2014 9:11:13 AM      MDM   Final diagnoses:  Right upper quadrant abdominal pain    Chest x-ray is clear.  Lungs are clear.  No hypoxia.  The patient is overall well-appearing.  Interestingly she is tender in her right upper quadrant she has elevated AST, ALT, alkaline phosphatase, T bili.  Ultrasound of her abdomen is without signs of cholelithiasis or cholecystitis.  Common bile duct is measured normally a 5 mm.  I spoke with gastroenterology, Dr. Earlean Shawl who agrees it sounds like there is possibly intermittent obstruction of the biliary tree based on her laboratory studies and her clinical presentation.  Acute hepatitis panel ordered.  Initially we attempted to obtain a HIDA scan today for which she was going to follow-up tomorrow in the office however this is unable to be completed today secondary to recent morphine use.  I have rescheduled  her to have a HIDA scan completed tomorrow morning at [facility].  She will have this completed in the morning and then she will follow-up with Dr. Earlean Shawl in the office tomorrow afternoon to follow-up on the results of the acute hepatitis panel as well as the results of the HIDA scan.  I suspect she will need repeat blood work at that time as well.  I asked Dr. Earlean Shawl about the possibility of a sending cholangitis and he suspected this to be much lower given her normal white blood cell count artifact that this is been intermittently going on over the past week.  He suspects that this was a sending cholangitis she would be much more ill at this time.  He will follow her up closely in the office tomorrow.  She understands to return to the ER for new or worsening symptoms.    Hoy Morn, MD 05/26/14 [phone]  Urine culture sent. Prescribed keflex 500mg  po TID x 5 days. Called into [facility] in [place]. i called the pt and informed her of the prescriptions. She requested another medication for nausea because she is concerned that her insurance will not cover the Zofran.  Patient was prescribed Phenergan with instructions of 25 mg by mouth every 6 hours when necessary nausea, dispense Nash, MD 05/26/14 [phone]

## 2014-05-26 NOTE — ED Notes (Signed)
[place] at bedside, will initiate IV and administer medications when US exam is complete.

## 2014-05-26 NOTE — ED Notes (Signed)
Per pt, states nuasea since last Tuesday-went to UC and was told if it got worse to come to ED-states it started out as heart burn-no vomiting-states SOB on and off for past week

## 2014-05-27 ENCOUNTER — Encounter (HOSPITAL_COMMUNITY)
Admission: RE | Admit: 2014-05-27 | Discharge: 2014-05-27 | Disposition: A | Discharge status: Home / Self Care | Payer: 59 | Source: Ambulatory Visit | Attending: Emergency Medicine | Admitting: Emergency Medicine

## 2014-05-27 DIAGNOSIS — R1011 Right upper quadrant pain: Secondary | ICD-10-CM | POA: Diagnosis present

## 2014-05-27 LAB — HEPATITIS PANEL, ACUTE
HCV Ab: NEGATIVE
HEP A IGM: NONREACTIVE
HEP B C IGM: NONREACTIVE
HEP B S AG: NEGATIVE

## 2014-05-27 MED ORDER — SINCALIDE 5 MCG IJ SOLR
INTRAMUSCULAR | Status: AC
  Filled 2014-05-27: qty 5

## 2014-05-27 MED ORDER — TECHNETIUM TC 99M MEBROFENIN IV KIT
5.0000 | PACK | Freq: Once | INTRAVENOUS | Status: AC | PRN
  Administered 2014-05-27: 5 via INTRAVENOUS

## 2014-05-27 MED ORDER — STERILE WATER FOR INJECTION IJ SOLN
INTRAMUSCULAR | Status: AC
  Filled 2014-05-27: qty 10

## 2014-06-09 ENCOUNTER — Encounter (HOSPITAL_COMMUNITY): Payer: Self-pay

## 2014-06-09 ENCOUNTER — Inpatient Hospital Stay (HOSPITAL_COMMUNITY)
Admission: EM | Admit: 2014-06-09 | Discharge: 2014-06-12 | DRG: 440 | Disposition: A | Discharge status: Home / Self Care | Payer: 59 | Attending: Family Medicine | Admitting: Family Medicine

## 2014-06-09 ENCOUNTER — Emergency Department (HOSPITAL_COMMUNITY): Payer: 59

## 2014-06-09 DIAGNOSIS — N3090 Cystitis, unspecified without hematuria: Secondary | ICD-10-CM | POA: Diagnosis present

## 2014-06-09 DIAGNOSIS — E039 Hypothyroidism, unspecified: Secondary | ICD-10-CM | POA: Diagnosis present

## 2014-06-09 DIAGNOSIS — R1013 Epigastric pain: Secondary | ICD-10-CM | POA: Insufficient documentation

## 2014-06-09 DIAGNOSIS — Z79899 Other long term (current) drug therapy: Secondary | ICD-10-CM | POA: Diagnosis not present

## 2014-06-09 DIAGNOSIS — K838 Other specified diseases of biliary tract: Secondary | ICD-10-CM | POA: Diagnosis present

## 2014-06-09 DIAGNOSIS — K859 Acute pancreatitis without necrosis or infection, unspecified: Secondary | ICD-10-CM | POA: Diagnosis present

## 2014-06-09 DIAGNOSIS — R740 Nonspecific elevation of levels of transaminase and lactic acid dehydrogenase [LDH]: Secondary | ICD-10-CM | POA: Diagnosis present

## 2014-06-09 DIAGNOSIS — Z87891 Personal history of nicotine dependence: Secondary | ICD-10-CM

## 2014-06-09 DIAGNOSIS — N390 Urinary tract infection, site not specified: Secondary | ICD-10-CM | POA: Diagnosis present

## 2014-06-09 DIAGNOSIS — Z86010 Personal history of colonic polyps: Secondary | ICD-10-CM | POA: Diagnosis not present

## 2014-06-09 DIAGNOSIS — K219 Gastro-esophageal reflux disease without esophagitis: Secondary | ICD-10-CM | POA: Diagnosis present

## 2014-06-09 DIAGNOSIS — R109 Unspecified abdominal pain: Secondary | ICD-10-CM

## 2014-06-09 DIAGNOSIS — K589 Irritable bowel syndrome without diarrhea: Secondary | ICD-10-CM | POA: Diagnosis present

## 2014-06-09 DIAGNOSIS — K858 Other acute pancreatitis without necrosis or infection: Secondary | ICD-10-CM

## 2014-06-09 DIAGNOSIS — R7401 Elevation of levels of liver transaminase levels: Secondary | ICD-10-CM | POA: Diagnosis present

## 2014-06-09 LAB — COMPREHENSIVE METABOLIC PANEL
ALT: 259 U/L — AB (ref 0–35)
AST: 594 U/L — AB (ref 0–37)
Albumin: 3.2 g/dL — ABNORMAL LOW (ref 3.5–5.2)
Alkaline Phosphatase: 396 U/L — ABNORMAL HIGH (ref 39–117)
Anion gap: 9 (ref 5–15)
BUN: 13 mg/dL (ref 6–23)
CALCIUM: 8.9 mg/dL (ref 8.4–10.5)
CO2: 23 mmol/L (ref 19–32)
CREATININE: 0.81 mg/dL (ref 0.50–1.10)
Chloride: 108 mEq/L (ref 96–112)
GFR calc Af Amer: 86 mL/min — ABNORMAL LOW (ref >90)
GFR, EST NON AFRICAN AMERICAN: 74 mL/min — AB (ref >90)
Glucose, Bld: 106 mg/dL — ABNORMAL HIGH (ref 70–99)
POTASSIUM: 4 mmol/L (ref 3.5–5.1)
SODIUM: 140 mmol/L (ref 135–145)
Total Bilirubin: 0.9 mg/dL (ref 0.3–1.2)
Total Protein: 6 g/dL (ref 6.0–8.3)

## 2014-06-09 LAB — CBC WITH DIFFERENTIAL/PLATELET
BASOS ABS: 0 10*3/uL (ref 0.0–0.1)
Basophils Relative: 0 % (ref 0–1)
Eosinophils Absolute: 0.3 10*3/uL (ref 0.0–0.7)
Eosinophils Relative: 3 % (ref 0–5)
HCT: 38.8 % (ref 36.0–46.0)
Hemoglobin: 12.6 g/dL (ref 12.0–15.0)
Lymphocytes Relative: 2 % — ABNORMAL LOW (ref 12–46)
Lymphs Abs: 0.2 10*3/uL — ABNORMAL LOW (ref 0.7–4.0)
MCH: 29.1 pg (ref 26.0–34.0)
MCHC: 32.5 g/dL (ref 30.0–36.0)
MCV: 89.6 fL (ref 78.0–100.0)
MONOS PCT: 1 % — AB (ref 3–12)
Monocytes Absolute: 0.1 10*3/uL (ref 0.1–1.0)
NEUTROS PCT: 94 % — AB (ref 43–77)
Neutro Abs: 7.6 10*3/uL (ref 1.7–7.7)
Platelets: 603 10*3/uL — ABNORMAL HIGH (ref 150–400)
RBC: 4.33 MIL/uL (ref 3.87–5.11)
RDW: 13 % (ref 11.5–15.5)
WBC: 8.1 10*3/uL (ref 4.0–10.5)

## 2014-06-09 LAB — CBC
HCT: 35.7 % — ABNORMAL LOW (ref 36.0–46.0)
Hemoglobin: 11.7 g/dL — ABNORMAL LOW (ref 12.0–15.0)
MCH: 29.5 pg (ref 26.0–34.0)
MCHC: 32.8 g/dL (ref 30.0–36.0)
MCV: 90.2 fL (ref 78.0–100.0)
Platelets: 588 10*3/uL — ABNORMAL HIGH (ref 150–400)
RBC: 3.96 MIL/uL (ref 3.87–5.11)
RDW: 13.3 % (ref 11.5–15.5)
WBC: 14.3 10*3/uL — AB (ref 4.0–10.5)

## 2014-06-09 LAB — URINALYSIS, ROUTINE W REFLEX MICROSCOPIC
Glucose, UA: NEGATIVE mg/dL
HGB URINE DIPSTICK: NEGATIVE
Ketones, ur: NEGATIVE mg/dL
Nitrite: NEGATIVE
PROTEIN: NEGATIVE mg/dL
Specific Gravity, Urine: 1.027 (ref 1.005–1.030)
UROBILINOGEN UA: 1 mg/dL (ref 0.0–1.0)
pH: 7 (ref 5.0–8.0)

## 2014-06-09 LAB — ETHANOL

## 2014-06-09 LAB — CREATININE, SERUM
CREATININE: 0.94 mg/dL (ref 0.50–1.10)
GFR calc Af Amer: 72 mL/min — ABNORMAL LOW (ref >90)
GFR calc non Af Amer: 62 mL/min — ABNORMAL LOW (ref >90)

## 2014-06-09 LAB — I-STAT CG4 LACTIC ACID, ED: LACTIC ACID, VENOUS: 1.92 mmol/L (ref 0.5–2.2)

## 2014-06-09 LAB — MAGNESIUM: Magnesium: 1.6 mg/dL (ref 1.5–2.5)

## 2014-06-09 LAB — TSH: TSH: 1.364 u[IU]/mL (ref 0.350–4.500)

## 2014-06-09 LAB — LIPASE, BLOOD: LIPASE: 393 U/L — AB (ref 11–59)

## 2014-06-09 LAB — PHOSPHORUS: Phosphorus: 4 mg/dL (ref 2.3–4.6)

## 2014-06-09 MED ORDER — CHLORHEXIDINE GLUCONATE 0.12 % MT SOLN
15.0000 mL | Freq: Two times a day (BID) | OROMUCOSAL | Status: DC
  Administered 2014-06-09 – 2014-06-12 (×6): 15 mL via OROMUCOSAL
  Filled 2014-06-09 (×8): qty 15

## 2014-06-09 MED ORDER — DEXTROSE 5 % IV SOLN
1.0000 g | INTRAVENOUS | Status: DC
  Administered 2014-06-09 – 2014-06-12 (×4): 1 g via INTRAVENOUS
  Filled 2014-06-09 (×4): qty 10

## 2014-06-09 MED ORDER — ONDANSETRON HCL 4 MG/2ML IJ SOLN: 4.0000 mg | Freq: Four times a day (QID) | INTRAMUSCULAR | Status: DC | PRN

## 2014-06-09 MED ORDER — LEVOTHYROXINE SODIUM 100 MCG IV SOLR
25.0000 ug | Freq: Every day | INTRAVENOUS | Status: DC
  Administered 2014-06-10 – 2014-06-12 (×3): 25 ug via INTRAVENOUS
  Filled 2014-06-09 (×3): qty 5

## 2014-06-09 MED ORDER — ONDANSETRON HCL 4 MG PO TABS: 4.0000 mg | ORAL_TABLET | Freq: Four times a day (QID) | ORAL | Status: DC | PRN

## 2014-06-09 MED ORDER — NAPHAZOLINE-PHENIRAMINE 0.025-0.3 % OP SOLN
2.0000 [drp] | Freq: Four times a day (QID) | OPHTHALMIC | Status: DC | PRN
  Filled 2014-06-09: qty 15

## 2014-06-09 MED ORDER — ACETAMINOPHEN 325 MG PO TABS: 650.0000 mg | ORAL_TABLET | Freq: Four times a day (QID) | ORAL | Status: DC | PRN

## 2014-06-09 MED ORDER — IOHEXOL 300 MG/ML  SOLN
100.0000 mL | Freq: Once | INTRAMUSCULAR | Status: AC | PRN
  Administered 2014-06-09: 100 mL via INTRAVENOUS

## 2014-06-09 MED ORDER — PANTOPRAZOLE SODIUM 40 MG IV SOLR
40.0000 mg | INTRAVENOUS | Status: DC
  Administered 2014-06-09 – 2014-06-11 (×3): 40 mg via INTRAVENOUS
  Filled 2014-06-09 (×4): qty 40

## 2014-06-09 MED ORDER — SODIUM CHLORIDE 0.9 % IV SOLN
INTRAVENOUS | Status: DC
  Administered 2014-06-09 – 2014-06-12 (×5): via INTRAVENOUS

## 2014-06-09 MED ORDER — SODIUM CHLORIDE 0.9 % IV SOLN
INTRAVENOUS | Status: AC
  Administered 2014-06-09: 13:00:00 via INTRAVENOUS

## 2014-06-09 MED ORDER — SODIUM CHLORIDE 0.9 % IV SOLN: INTRAVENOUS | Status: DC

## 2014-06-09 MED ORDER — MORPHINE SULFATE 4 MG/ML IJ SOLN
4.0000 mg | Freq: Once | INTRAMUSCULAR | Status: AC
  Administered 2014-06-09: 4 mg via INTRAVENOUS
  Filled 2014-06-09: qty 1

## 2014-06-09 MED ORDER — IOHEXOL 300 MG/ML  SOLN
50.0000 mL | Freq: Once | INTRAMUSCULAR | Status: AC | PRN
  Administered 2014-06-09: 50 mL via ORAL

## 2014-06-09 MED ORDER — ENOXAPARIN SODIUM 40 MG/0.4ML ~~LOC~~ SOLN
40.0000 mg | SUBCUTANEOUS | Status: DC
  Administered 2014-06-09 – 2014-06-11 (×3): 40 mg via SUBCUTANEOUS
  Filled 2014-06-09 (×4): qty 0.4

## 2014-06-09 MED ORDER — SODIUM CHLORIDE 0.9 % IV SOLN
INTRAVENOUS | Status: DC
  Administered 2014-06-09: 09:00:00 via INTRAVENOUS

## 2014-06-09 MED ORDER — CETYLPYRIDINIUM CHLORIDE 0.05 % MT LIQD
7.0000 mL | Freq: Two times a day (BID) | OROMUCOSAL | Status: DC
  Administered 2014-06-10 – 2014-06-11 (×3): 7 mL via OROMUCOSAL

## 2014-06-09 MED ORDER — ACETAMINOPHEN 650 MG RE SUPP: 650.0000 mg | Freq: Four times a day (QID) | RECTAL | Status: DC | PRN

## 2014-06-09 MED ORDER — MORPHINE SULFATE 2 MG/ML IJ SOLN
1.0000 mg | INTRAMUSCULAR | Status: DC | PRN
  Administered 2014-06-09 – 2014-06-11 (×3): 1 mg via INTRAVENOUS
  Filled 2014-06-09 (×3): qty 1

## 2014-06-09 NOTE — H&P (Signed)
[facility] History and Physical  [name] YSA:[id number] DOB: [birth year] DOA: 06/09/2014  Referring physician: Dr. Zenia Resides PCP: Elby Showers, MD   Chief Complaint: abd pain, nausea, fever  HPI: Chelsea Cantrell is a 69 y.o. female with PMH significant for IBS, hypothyroidism, GERD and exercise induced asthma; presented to Ed complaining of abd pain, nausea and fever. Patient with similar sx's since 05/20/14 (on and off); initially thought to be secondary to viral infection, but at that time her lipase was elevated and work up demonstrated normal [place] and HIDA scan. Patient also had viral panel done by her GI specialist and were neg. This time in ED patient is nauseous and complaining of epigastric pain and RUQ pain; work up demonstrated lipase of 394, elevated LFT's (with normal bilirubin) and normal CT abd/pelvis. Patient also found with UA suggesting UTI. [facility] called to admit patient for further evaluation and treatment.  Review of Systems:  Negative except as mentioned on HPI.  Past Medical History  Diagnosis Date  . Schatzki's ring   . GERD (gastroesophageal reflux disease)   . Arthritis   . Allergy   . Colon polyps   . Constipation   . Spinal stenosis   . Seasonal affective disorder   . IBS (irritable bowel syndrome)   . Thyroid disease     Hypo  . Osteopenia 2007    T score -1.4  . Asthma     exercise induced   Past Surgical History  Procedure Laterality Date  . Hip surgery Right 2001    Replacement  . Foot surgery Left   . Joint replacement     Social History:  reports that she quit smoking about 40 years ago. She has never used smokeless tobacco. She reports that she drinks about 1 glass of wine on daily basis and some time 1-2 drinks of gyn. She reports that she does not use illicit drugs.  No Known Allergies  Family History  Problem Relation Age of Onset  . Stroke Mother   . Osteoporosis Mother   . Heart disease Father   . Hyperlipidemia Father     . Lung cancer Father      Prior to Admission medications   Medication Sig Start Date End Date Taking? Authorizing Provider  Digestive Enzymes (ENZYME DIGEST) CAPS Take 1 capsule by mouth daily.    Yes Historical Provider, MD  LINZESS 290 MCG CAPS capsule Take 290 mcg by mouth daily.  03/19/14  Yes Historical Provider, MD  MAGNESIUM ASPARTATE PO Take 1 tablet by mouth at bedtime.    Yes Historical Provider, MD  Multiple Vitamin (MULTIVITAMIN) tablet Take 1 tablet by mouth daily.     Yes Historical Provider, MD  naproxen sodium (ANAPROX) 220 MG tablet Take 220 mg by mouth every 8 (eight) hours as needed (for pain and fever).   Yes Historical Provider, MD  ondansetron (ZOFRAN ODT) 8 MG disintegrating tablet Take 1 tablet (8 mg total) by mouth every 8 (eight) hours as needed for nausea or vomiting. 05/26/14  Yes Hoy Morn, MD  SYNTHROID 50 MCG tablet Take 1 tablet by mouth  daily 01/02/14  Yes Elby Showers, MD  VASCEPA 1 G CAPS Take 2 capsules by mouth  twice daily at noon and 4PM 12/30/13  Yes Elby Showers, MD  VENTOLIN HFA 108 (90 BASE) MCG/ACT inhaler Inhale 2 puffs into the lungs every 6 (six) hours as needed for wheezing or shortness of breath.  02/16/12  Yes Historical  Provider, MD  celecoxib (CELEBREX) 200 MG capsule Take 1 capsule (200 mg total) by mouth daily. Patient not taking: Reported on 05/26/2014 03/14/13   Elby Showers, MD  cephALEXin (KEFLEX) 500 MG capsule Take 1 capsule (500 mg total) by mouth 3 (three) times daily. Patient not taking: Reported on 06/09/2014 05/26/14   Hoy Morn, MD   Physical Exam: Filed Vitals:   06/09/14 1045 06/09/14 1211 06/09/14 1225 06/09/14 1245  BP: 102/48 90/47 90/45  89/43  Pulse: 98 87 96 92  Temp: 98.8 F (37.1 C)   98.5 F (36.9 C)  TempSrc: Oral   Oral  Resp: 16 16 16 18   SpO2: 94% 94% 94% 98%    Wt Readings from Last 3 Encounters:  05/20/14 64.592 kg (142 lb 6.4 oz)  09/17/13 64.864 kg (143 lb)  03/14/13 63.05 kg (139 lb)     General:  Appears calm and complaining of epigastric pain; no fever currently, but reports high grade fever prior to admission. Patient is feeling nauseous, but not vomiting.  Eyes: PERRL, normal lids, irises & conjunctiva, no icterus ENT: grossly normal hearing, MMM, no erythema or exudates; no thrush Neck: no LAD, masses or thyromegaly; no JVD Cardiovascular: RRR, no m/r/g. No LE edema. Respiratory: CTA bilaterally, no w/r/r. Normal respiratory effort. Abdomen: soft, tender to palpation on epigastric area and RUQ Skin: no rash, open wounds or petechiae seen on exam Musculoskeletal: grossly normal tone BUE/BLE Psychiatric: grossly normal mood and affect, speech fluent and appropriate Neurologic: grossly non-focal.          Labs on Admission:  Basic Metabolic Panel:  Recent Labs Lab 06/09/14 0824  NA 140  K 4.0  CL 108  CO2 23  GLUCOSE 106*  BUN 13  CREATININE 0.81  CALCIUM 8.9   Liver Function Tests:  Recent Labs Lab 06/09/14 0824  AST 594*  ALT 259*  ALKPHOS 396*  BILITOT 0.9  PROT 6.0  ALBUMIN 3.2*    Recent Labs Lab 06/09/14 0824  LIPASE 393*   CBC:  Recent Labs Lab 06/09/14 0824  WBC 8.1  NEUTROABS 7.6  HGB 12.6  HCT 38.8  MCV 89.6  PLT 603*    Radiological Exams on Admission: Ct Abdomen Pelvis W Contrast  06/09/2014   CLINICAL DATA:  Chronic constipation. Mid abdominal pain with nausea and fever. Elevated serum lipase.  EXAM: CT ABDOMEN AND PELVIS WITH CONTRAST  TECHNIQUE: Multidetector CT imaging of the abdomen and pelvis was performed using the standard protocol following bolus administration of intravenous contrast. Oral contrast was also administered.  CONTRAST:  86mL OMNIPAQUE IOHEXOL 300 MG/ML SOLN, 162mL OMNIPAQUE IOHEXOL 300 MG/ML SOLN  COMPARISON:  None.  FINDINGS: There is focal infiltrate in the inferior lingula. There is mild bibasilar lung scarring.  The liver is prominent, measuring 18.3 cm in length. No focal liver lesions are  appreciable. There is evidence of a degree of hepatic steatosis. Gallbladder wall does not appear thickened. There is no biliary duct dilatation.  Spleen appears normal. No pancreatic lesions are identified. Pancreatic duct is not dilated. 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 No inflammation is seen in this area.  Adrenals appear normal. Kidneys bilaterally show no mass or hydronephrosis. There is no renal or ureteral calculus on either side.  In the pelvis, there is artifact from a total hip prosthesis on the right. There is no pelvic mass or pelvic fluid collection. The urinary bladder is  midline with normal wall thickness. Appendix appears unremarkable.  There is diffuse stool throughout the colon. Rectum is distended with stool.  There is no bowel obstruction.  No free air or portal venous air.  There is no appreciable ascites, adenopathy, or abscess in the abdomen or pelvis. There is atherosclerotic change in the aorta but no apparent aneurysm. There is degenerative change in the lumbar spine with marked narrowing at L3-4 and L4-5. There are no blastic or lytic bone lesions.  IMPRESSION: Diffuse stool throughout colon. Rectum distended with stool. These findings are consistent with constipation.  No bowel obstruction.  No abscess.  Appendix region appears normal.  Focal infiltrate inferior lingula.  Prominent liver with hepatic steatosis.  No renal or ureteral calculus.  No hydronephrosis.  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  Extensive mid lumbar osteoarthritic change.  No pancreatic lesions. Note that there is a focal diverticulum in the second portion of the duodenum immediately adjacent to the pancreatic head, not causing inflammation. This diverticulum measures slightly greater than 1 cm in size.   Electronically Signed   By: Lowella Grip M.D.   On: 06/09/2014 10:29    EKG:  None   Assessment/Plan 1-Pancreatitis: with  concerns for alcohol induce (patient reports drinking just a glass of wine on daily basis; but endorses having gyn and tonic the afternoon before the pancreatitis process started this time); had recent hepatitis panel (neg; 05/26/14) and her LEFT's are demonstrating perfect split 2-1 AST-ALT -CT abd in ED unremarkable  -recent Hida scan and abd [place] w/o abnormalities -Normal bilirubin -will check alcohol level -NPO, IVF's, PRN analgesics and antiemetics -will follow clinical response  2-GERD: will continue PPI  3-Hypothyroidism: will continue synthroid (IV while NPO)  4-Transaminitis: associated with pancreatitis vs alcohol abuse -will follow CMET  5-UTI (urinary tract infection):  -will use rocephin and follow urine culture -PRN antipyretics available   Discussed with Dr. Earlean Shawl (recommended to treat pancreatitis and that she might require EUS want process cool off; if needed please discuss with Dr. Benson [place] for procedure)  Code Status: Full DVT Prophylaxis:lovenox Family Communication: no family at bedside Disposition Plan: LOS > 2 midnights, inpatient; med-surg bed  Time spent: 50 minutes  [place], [facility] Pager [phone]

## 2014-06-09 NOTE — ED Provider Notes (Signed)
CSN: [id number]     Arrival date & time 06/09/14  0740 History   First MD Initiated Contact with Patient 06/09/14 0753     No chief complaint on file.    (Consider location/radiation/quality/duration/timing/severity/associated sxs/prior Treatment) HPI Comments: Patient here complaining of diffuse abdominal pain with associated nausea and fever that began this morning. Took Motrin with some relief. Seen here last week for similar symptoms as well by her gastroenterologist and diagnosed with having a viral illness. She recently had a negative hepatobiliary scan. Denies any vomiting or diarrhea. Some nausea that was treated with Zofran. No urinary symptoms. No cough or congestion. Nothing makes her symptoms worse. Denies any rashes.  The history is provided by the patient.    Past Medical History  Diagnosis Date  . Schatzki's ring   . GERD (gastroesophageal reflux disease)   . Arthritis   . Asthma   . Allergy   . Colon polyps   . Constipation   . Spinal stenosis   . Seasonal affective disorder   . IBS (irritable bowel syndrome)   . Thyroid disease     Hypo  . Osteopenia 2007    T score -1.4   Past Surgical History  Procedure Laterality Date  . Hip surgery Right 2001    Replacement  . Foot surgery Left   . Joint replacement     Family History  Problem Relation Age of Onset  . Stroke Mother   . Osteoporosis Mother   . Heart disease Father   . Hyperlipidemia Father   . Lung cancer Father    History  Substance Use Topics  . Smoking status: Former Smoker    Quit date: 05/23/1974  . Smokeless tobacco: Never Used  . Alcohol Use: 4.2 oz/week    7 Glasses of wine per week   OB History    Gravida Para Term Preterm AB TAB SAB Ectopic Multiple Living   1    1     0     Review of Systems  All other systems reviewed and are negative.     Allergies  Review of patient's allergies indicates no known allergies.  Home Medications   Prior to Admission medications    Medication Sig Start Date End Date Taking? Authorizing Provider  Digestive Enzymes (ENZYME DIGEST) CAPS Take 1 capsule by mouth daily.    Yes Historical Provider, MD  LINZESS 290 MCG CAPS capsule Take 290 mcg by mouth daily.  03/19/14  Yes Historical Provider, MD  MAGNESIUM ASPARTATE PO Take 1 tablet by mouth at bedtime.    Yes Historical Provider, MD  Multiple Vitamin (MULTIVITAMIN) tablet Take 1 tablet by mouth daily.     Yes Historical Provider, MD  naproxen sodium (ANAPROX) 220 MG tablet Take 220 mg by mouth every 8 (eight) hours as needed (for pain and fever).   Yes Historical Provider, MD  ondansetron (ZOFRAN ODT) 8 MG disintegrating tablet Take 1 tablet (8 mg total) by mouth every 8 (eight) hours as needed for nausea or vomiting. 05/26/14  Yes Hoy Morn, MD  SYNTHROID 50 MCG tablet Take 1 tablet by mouth  daily 01/02/14  Yes Elby Showers, MD  VASCEPA 1 G CAPS Take 2 capsules by mouth  twice daily at noon and 4PM 12/30/13  Yes Elby Showers, MD  VENTOLIN HFA 108 (90 BASE) MCG/ACT inhaler Inhale 2 puffs into the lungs every 6 (six) hours as needed for wheezing or shortness of breath.  02/16/12  Yes Historical  Provider, MD  celecoxib (CELEBREX) 200 MG capsule Take 1 capsule (200 mg total) by mouth daily. Patient not taking: Reported on 05/26/2014 03/14/13   Elby Showers, MD  cephALEXin (KEFLEX) 500 MG capsule Take 1 capsule (500 mg total) by mouth 3 (three) times daily. Patient not taking: Reported on 06/09/2014 05/26/14   Hoy Morn, MD   BP 118/52 mmHg  Pulse 107  Temp(Src) 98.2 F (36.8 C) (Oral)  Resp 16  SpO2 96% Physical Exam  Constitutional: She is oriented to person, place, and time. She appears well-developed and well-nourished.  Non-toxic appearance. No distress.  HENT:  Head: Normocephalic and atraumatic.  Eyes: Conjunctivae, EOM and lids are normal. Pupils are equal, round, and reactive to light.  Neck: Normal range of motion. Neck supple. No tracheal deviation present.  No thyroid mass present.  Cardiovascular: Normal rate, regular rhythm and normal heart sounds.  Exam reveals no gallop.   No murmur heard. Pulmonary/Chest: Effort normal and breath sounds normal. No stridor. No respiratory distress. She has no decreased breath sounds. She has no wheezes. She has no rhonchi. She has no rales.  Abdominal: Soft. Normal appearance and bowel sounds are normal. She exhibits no distension. There is generalized tenderness. There is no rebound and no CVA tenderness.  Musculoskeletal: Normal range of motion. She exhibits no edema or tenderness.  Neurological: She is alert and oriented to person, place, and time. She has normal strength. No cranial nerve deficit or sensory deficit. GCS eye subscore is 4. GCS verbal subscore is 5. GCS motor subscore is 6.  Skin: Skin is warm and dry. No abrasion and no rash noted.  Psychiatric: She has a normal mood and affect. Her speech is normal and behavior is normal.  Nursing note and vitals reviewed.   ED Course  Procedures (including critical care time) Labs Review Labs Reviewed  URINE CULTURE  CBC WITH DIFFERENTIAL  COMPREHENSIVE METABOLIC PANEL  LIPASE, BLOOD  URINALYSIS, ROUTINE W REFLEX MICROSCOPIC  I-STAT CG4 LACTIC ACID, ED    Imaging Review No results found.   EKG Interpretation None      MDM   Final diagnoses:  Abdominal pain   Patient started on Rocephin for her UTI. Given pain medications here 2. Spoke with patient's gastric neurologist and he will see the patient in consultation. Patient to be admitted to the medicine service    Leota Jacobsen, MD 06/09/14 1102

## 2014-06-09 NOTE — ED Notes (Signed)
Patiaenat c/o mid abdominal pain and fever that began at 0300 this AM. Patient states her temp was 101.3 orally and she took Aleve at that time.

## 2014-06-09 NOTE — ED Notes (Signed)
Patient transported to CT 

## 2014-06-09 NOTE — ED Notes (Signed)
Pt unable to void at this time. 

## 2014-06-09 NOTE — ED Notes (Signed)
Pt stated she will try and void once she has finished her contrast.

## 2014-06-09 NOTE — Progress Notes (Signed)
UR completed 

## 2014-06-10 LAB — COMPREHENSIVE METABOLIC PANEL
ALT: 189 U/L — AB (ref 0–35)
ANION GAP: 5 (ref 5–15)
AST: 191 U/L — ABNORMAL HIGH (ref 0–37)
Albumin: 2.4 g/dL — ABNORMAL LOW (ref 3.5–5.2)
Alkaline Phosphatase: 273 U/L — ABNORMAL HIGH (ref 39–117)
BILIRUBIN TOTAL: 0.7 mg/dL (ref 0.3–1.2)
BUN: 14 mg/dL (ref 6–23)
CO2: 24 mmol/L (ref 19–32)
Calcium: 7.9 mg/dL — ABNORMAL LOW (ref 8.4–10.5)
Chloride: 111 mEq/L (ref 96–112)
Creatinine, Ser: 0.92 mg/dL (ref 0.50–1.10)
GFR calc Af Amer: 74 mL/min — ABNORMAL LOW (ref >90)
GFR calc non Af Amer: 63 mL/min — ABNORMAL LOW (ref >90)
Glucose, Bld: 92 mg/dL (ref 70–99)
Potassium: 3.9 mmol/L (ref 3.5–5.1)
SODIUM: 140 mmol/L (ref 135–145)
TOTAL PROTEIN: 4.9 g/dL — AB (ref 6.0–8.3)

## 2014-06-10 LAB — URINE CULTURE

## 2014-06-10 LAB — HIV ANTIBODY (ROUTINE TESTING W REFLEX): HIV 1/HIV 2 AB: NONREACTIVE

## 2014-06-10 LAB — URINE MICROSCOPIC-ADD ON

## 2014-06-10 LAB — LIPASE, BLOOD: Lipase: 133 U/L — ABNORMAL HIGH (ref 11–59)

## 2014-06-10 NOTE — Progress Notes (Signed)
[facility] PROGRESS NOTE  Chelsea Cantrell YYT:[id number] DOB: 10-12-1945 DOA: 06/09/2014 PCP: Elby Showers, MD  Assessment/Plan: 1-Pancreatitis: with concerns for alcohol induce (patient reports drinking just a glass of wine on daily basis; but endorses having gyn and tonic the afternoon before the pancreatitis process started this time); had recent hepatitis panel (neg; 05/26/14) and her LEFT's are demonstrating perfect split 2-1 AST-ALT -CT abd in ED unremarkable  -recent Hida scan and abd [place] w/o abnormalities -Normal bilirubin ETOH < 5 -will give one more day of NPO, IVF's, PRN analgesics and antiemetics; if stable will advance diet and switch med to PO in am -will follow clinical response  2-GERD: will continue PPI  3-Hypothyroidism: will continue synthroid (IV while NPO)  4-Transaminitis: associated with pancreatitis vs alcohol abuse -trending down -Will benefit of EUS at some point after condition cool off.  5-UTI (urinary tract infection):  -will continue use of rocephin and follow urine culture -PRN antipyretics   Code Status: Full Family Communication: no family at bedside Disposition Plan: home in 1-2 days   Consultants:  GI (Dr. Earlean Shawl)  Procedures:  See below for x-ray reports   Antibiotics:  Rocephin   HPI/Subjective: Afebrile, feeling better. Reports just mild epigastric pain. No N/V  Objective: Filed Vitals:   06/10/14 0543  BP: 96/50  Pulse: 86  Temp: 98.6 F (37 C)  Resp: 18    Intake/Output Summary (Last 24 hours) at 06/10/14 0929 Last data filed at 06/10/14 0518  Gross per 24 hour  Intake 2441.2 ml  Output      0 ml  Net 2441.2 ml   Filed Weights   06/10/14 0543  Weight: 64.592 kg (142 lb 6.4 oz)    Exam:   General:  Afebrile, feeling better; just mild epigastric discomfort. Denies nausea.  Cardiovascular: S1 and S2, no rubs or gallops  Respiratory: CTA bilaterally  Abdomen: soft, no guarding; mild tenderness  with deep palpation in epigastric region  Musculoskeletal: no edema, cyanosis or clubbing   Data Reviewed: Basic Metabolic Panel:  Recent Labs Lab 06/09/14 0824 06/09/14 1615 06/10/14 0515  NA 140  --  140  K 4.0  --  3.9  CL 108  --  111  CO2 23  --  24  GLUCOSE 106*  --  92  BUN 13  --  14  CREATININE 0.81 0.94 0.92  CALCIUM 8.9  --  7.9*  MG  --  1.6  --   PHOS  --  4.0  --    Liver Function Tests:  Recent Labs Lab 06/09/14 0824 06/10/14 0515  AST 594* 191*  ALT 259* 189*  ALKPHOS 396* 273*  BILITOT 0.9 0.7  PROT 6.0 4.9*  ALBUMIN 3.2* 2.4*    Recent Labs Lab 06/09/14 0824 06/10/14 0515  LIPASE 393* 133*   CBC:  Recent Labs Lab 06/09/14 0824 06/09/14 1615  WBC 8.1 14.3*  NEUTROABS 7.6  --   HGB 12.6 11.7*  HCT 38.8 35.7*  MCV 89.6 90.2  PLT 603* 588*    Studies: Ct Abdomen Pelvis W Contrast  06/09/2014   CLINICAL DATA:  Chronic constipation. Mid abdominal pain with nausea and fever. Elevated serum lipase.  EXAM: CT ABDOMEN AND PELVIS WITH CONTRAST  TECHNIQUE: Multidetector CT imaging of the abdomen and pelvis was performed using the standard protocol following bolus administration of intravenous contrast. Oral contrast was also administered.  CONTRAST:  39mL OMNIPAQUE IOHEXOL 300 MG/ML SOLN, 121mL OMNIPAQUE IOHEXOL 300 MG/ML SOLN  COMPARISON:  None.  FINDINGS: There is focal infiltrate in the inferior lingula. There is mild bibasilar lung scarring.  The liver is prominent, measuring 18.3 cm in length. No focal liver lesions are appreciable. There is evidence of a degree of hepatic steatosis. Gallbladder wall does not appear thickened. There is no biliary duct dilatation.  Spleen appears normal. No pancreatic lesions are identified. Pancreatic duct is not dilated. 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 No inflammation is seen in this area.  Adrenals appear normal. Kidneys  bilaterally show no mass or hydronephrosis. There is no renal or ureteral calculus on either side.  In the pelvis, there is artifact from a total hip prosthesis on the right. There is no pelvic mass or pelvic fluid collection. The urinary bladder is midline with normal wall thickness. Appendix appears unremarkable.  There is diffuse stool throughout the colon. Rectum is distended with stool.  There is no bowel obstruction.  No free air or portal venous air.  There is no appreciable ascites, adenopathy, or abscess in the abdomen or pelvis. There is atherosclerotic change in the aorta but no apparent aneurysm. There is degenerative change in the lumbar spine with marked narrowing at L3-4 and L4-5. There are no blastic or lytic bone lesions.  IMPRESSION: Diffuse stool throughout colon. Rectum distended with stool. These findings are consistent with constipation.  No bowel obstruction.  No abscess.  Appendix region appears normal.  Focal infiltrate inferior lingula.  Prominent liver with hepatic steatosis.  No renal or ureteral calculus.  No hydronephrosis.  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  Extensive mid lumbar osteoarthritic change.  No pancreatic lesions. Note that there is a focal diverticulum in the second portion of the duodenum immediately adjacent to the pancreatic head, not causing inflammation. This diverticulum measures slightly greater than 1 cm in size.   Electronically Signed   By: Lowella Grip M.D.   On: 06/09/2014 10:29    Scheduled Meds: . antiseptic oral rinse  7 mL Mouth Rinse q12n4p  . cefTRIAXone (ROCEPHIN)  IV  1 g Intravenous Q24H  . chlorhexidine  15 mL Mouth Rinse BID  . enoxaparin (LOVENOX) injection  40 mg Subcutaneous Q24H  . levothyroxine  25 mcg Intravenous Daily  . pantoprazole (PROTONIX) IV  40 mg Intravenous Q24H   Continuous Infusions: . sodium chloride 150 mL/hr at 06/10/14 3818    Active Problems:   GE reflux   Hypothyroidism   Pancreatitis   Abdominal pain    Transaminitis   UTI (urinary tract infection)   Epigastric pain    Time spent: 30 minutes    Barton Dubois  [facility] Pager [phone]. If 7PM-7AM, please contact night-coverage at [email or web address], password [facility] 06/10/2014, 9:29 AM  LOS: 1 day

## 2014-06-10 NOTE — Consult Note (Signed)
Reason for Consult: ? Biliary pancreatitis Referring Physician: [facility]  [name] HPI: This is a 69 year old female with a PMH of IBS, GERD, and colonic polyps admitted for pancreatitis.  She started to have issues with diffuse body aches and general malaise during the end of December.  Her symptoms did progress to the point that she sought care in the ER on 05/26/2014.  The work up at that time was negative with the exception of elevated liver enzymes in an obstructive pattern.  A biliary work up through Dr. Liliane Channel office was negative as well as a viral hepatitis panel.  The patient's symptoms subsided only to recur on 06/09/2014.  Her liver enzymes again demonstrated a biliary pattern and this time she has pancreatitis.  Her lipase was elevated in the 300 range.  The CT scan obtained was negative for any pancreatic inflammation, but she has not received significant IV hydration.  No biliary abnormalities noted on the CT scan and as a result of her symptoms a GI consultation was requested.  During the interval time period from 05/26/2014 until her admission on 06/09/2014 she did not have any pain.  In fact, she felt quite well.  On Sunday evening she drank a Gin and Tonic at approximately 5 PM, but no further ETOH or food since that time.  At 3 AM she woke up with acute epigastric abdominal pain and a fever.  Past Medical History  Diagnosis Date  . Schatzki's ring   . GERD (gastroesophageal reflux disease)   . Arthritis   . Allergy   . Colon polyps   . Constipation   . Spinal stenosis   . Seasonal affective disorder   . IBS (irritable bowel syndrome)   . Thyroid disease     Hypo  . Osteopenia 2007    T score -1.4  . Asthma     exercise induced    Past Surgical History  Procedure Laterality Date  . Hip surgery Right 2001    Replacement  . Foot surgery Left   . Joint replacement      Family History  Problem Relation Age of Onset  . Stroke Mother   . Osteoporosis  Mother   . Heart disease Father   . Hyperlipidemia Father   . Lung cancer Father     Social History:  reports that she quit smoking about 40 years ago. She has never used smokeless tobacco. She reports that she drinks about 4.2 oz of alcohol per week. She reports that she does not use illicit drugs.  Allergies: No Known Allergies  Medications:  Scheduled: . antiseptic oral rinse  7 mL Mouth Rinse q12n4p  . cefTRIAXone (ROCEPHIN)  IV  1 g Intravenous Q24H  . chlorhexidine  15 mL Mouth Rinse BID  . enoxaparin (LOVENOX) injection  40 mg Subcutaneous Q24H  . levothyroxine  25 mcg Intravenous Daily  . pantoprazole (PROTONIX) IV  40 mg Intravenous Q24H   Continuous: . sodium chloride 100 mL/hr at 06/10/14 1013    Results for orders placed or performed during the hospital encounter of 06/09/14 (from the past 24 hour(s))  Comprehensive metabolic panel     Status: Abnormal   Collection Time: 06/10/14  5:15 AM  Result Value Ref Range   Sodium 140 135 - 145 mmol/L   Potassium 3.9 3.5 - 5.1 mmol/L   Chloride 111 96 - 112 mEq/L   CO2 24 19 - 32 mmol/L   Glucose, Bld 92 70 -  99 mg/dL   BUN 14 6 - 23 mg/dL   Creatinine, Ser 0.92 0.50 - 1.10 mg/dL   Calcium 7.9 (L) 8.4 - 10.5 mg/dL   Total Protein 4.9 (L) 6.0 - 8.3 g/dL   Albumin 2.4 (L) 3.5 - 5.2 g/dL   AST 191 (H) 0 - 37 U/L   ALT 189 (H) 0 - 35 U/L   Alkaline Phosphatase 273 (H) 39 - 117 U/L   Total Bilirubin 0.7 0.3 - 1.2 mg/dL   GFR calc non Af Amer 63 (L) >90 mL/min   GFR calc Af Amer 74 (L) >90 mL/min   Anion gap 5 5 - 15  Lipase, blood     Status: Abnormal   Collection Time: 06/10/14  5:15 AM  Result Value Ref Range   Lipase 133 (H) 11 - 59 U/L     Ct Abdomen Pelvis W Contrast  06/09/2014   CLINICAL DATA:  Chronic constipation. Mid abdominal pain with nausea and fever. Elevated serum lipase.  EXAM: CT ABDOMEN AND PELVIS WITH CONTRAST  TECHNIQUE: Multidetector CT imaging of the abdomen and pelvis was performed using the  standard protocol following bolus administration of intravenous contrast. Oral contrast was also administered.  CONTRAST:  67mL OMNIPAQUE IOHEXOL 300 MG/ML SOLN, 165mL OMNIPAQUE IOHEXOL 300 MG/ML SOLN  COMPARISON:  None.  FINDINGS: There is focal infiltrate in the inferior lingula. There is mild bibasilar lung scarring.  The liver is prominent, measuring 18.3 cm in length. No focal liver lesions are appreciable. There is evidence of a degree of hepatic steatosis. Gallbladder wall does not appear thickened. There is no biliary duct dilatation.  Spleen appears normal. No pancreatic lesions are identified. Pancreatic duct is not dilated. 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 No inflammation is seen in this area.  Adrenals appear normal. Kidneys bilaterally show no mass or hydronephrosis. There is no renal or ureteral calculus on either side.  In the pelvis, there is artifact from a total hip prosthesis on the right. There is no pelvic mass or pelvic fluid collection. The urinary bladder is midline with normal wall thickness. Appendix appears unremarkable.  There is diffuse stool throughout the colon. Rectum is distended with stool.  There is no bowel obstruction.  No free air or portal venous air.  There is no appreciable ascites, adenopathy, or abscess in the abdomen or pelvis. There is atherosclerotic change in the aorta but no apparent aneurysm. There is degenerative change in the lumbar spine with marked narrowing at L3-4 and L4-5. There are no blastic or lytic bone lesions.  IMPRESSION: Diffuse stool throughout colon. Rectum distended with stool. These findings are consistent with constipation.  No bowel obstruction.  No abscess.  Appendix region appears normal.  Focal infiltrate inferior lingula.  Prominent liver with hepatic steatosis.  No renal or ureteral calculus.  No hydronephrosis.  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  Extensive mid  lumbar osteoarthritic change.  No pancreatic lesions. Note that there is a focal diverticulum in the second portion of the duodenum immediately adjacent to the pancreatic head, not causing inflammation. This diverticulum measures slightly greater than 1 cm in size.   Electronically Signed   By: Lowella Grip M.D.   On: 06/09/2014 10:29    ROS:  As stated above in the HPI otherwise negative.  Blood pressure 99/58, pulse 73, temperature 98.9 F (37.2 C), temperature source Oral, resp. rate 16, height 5\' 7"  (1.702 m),  weight 64.592 kg (142 lb 6.4 oz), SpO2 100 %.    PE: Gen: NAD, Alert and Oriented HEENT:  [place]/AT, EOMI Neck: Supple, no LAD Lungs: CTA Bilaterally CV: RRR without M/G/R ABM: Soft, NTND, +BS Ext: No C/C/E  Assessment/Plan: 1) Acute pancreatitis. 2) ? Biliary source for the acute pancreatitis. 3) Abnormal liver enzymes.   Currently she feels well.  No further use of any pain medications since last evening and her pain has essentially resolved.  The CT scan does not show any evidence of pancreatitis, but she did not receive any aggressive IV hydration.  If a repeat CT scan was performed at this time it would most likely reveal inflammation, but this repeat examination is not required at this time.  I am very suspicious of a biliary source for her pancreatitis even though her work up has been negative.  An EUS is appropriate to further evaluation her pancreas and biliary tract, but two months needs to elapse to allow for any inflammation to resolve.  The other possibility is SOD Type II.  She has all the clinical and biochemical parameters to meet this etiology, but it is not an easy diagnosis to verify.    Plan: 1) Clinically, if she doe not have pain in the morning and her liver enzymes continue to improve she can be discharged home. 2) I will confer with Dr. Earlean Shawl about her case. 3) I will schedule her for an outpatient EUS in two months time.  Trilby Way D 06/10/2014,  4:31 PM

## 2014-06-11 DIAGNOSIS — K850 Idiopathic acute pancreatitis: Secondary | ICD-10-CM

## 2014-06-11 LAB — LIPID PANEL
Cholesterol: 155 mg/dL (ref 0–200)
HDL: 47 mg/dL (ref >39)
LDL Cholesterol: 86 mg/dL (ref 0–99)
TRIGLYCERIDES: 108 mg/dL (ref <150)
Total CHOL/HDL Ratio: 3.3 RATIO
VLDL: 22 mg/dL (ref 0–40)

## 2014-06-11 LAB — COMPREHENSIVE METABOLIC PANEL
ALBUMIN: 2.4 g/dL — AB (ref 3.5–5.2)
ALT: 126 U/L — ABNORMAL HIGH (ref 0–35)
AST: 81 U/L — ABNORMAL HIGH (ref 0–37)
Alkaline Phosphatase: 257 U/L — ABNORMAL HIGH (ref 39–117)
Anion gap: 9 (ref 5–15)
BUN: 13 mg/dL (ref 6–23)
CHLORIDE: 111 meq/L (ref 96–112)
CO2: 20 mmol/L (ref 19–32)
Calcium: 8.1 mg/dL — ABNORMAL LOW (ref 8.4–10.5)
Creatinine, Ser: 0.75 mg/dL (ref 0.50–1.10)
GFR, EST NON AFRICAN AMERICAN: 86 mL/min — AB (ref >90)
Glucose, Bld: 69 mg/dL — ABNORMAL LOW (ref 70–99)
POTASSIUM: 3.2 mmol/L — AB (ref 3.5–5.1)
SODIUM: 140 mmol/L (ref 135–145)
Total Bilirubin: 0.7 mg/dL (ref 0.3–1.2)
Total Protein: 4.9 g/dL — ABNORMAL LOW (ref 6.0–8.3)

## 2014-06-11 LAB — GLUCOSE, CAPILLARY: GLUCOSE-CAPILLARY: 60 mg/dL — AB (ref 70–99)

## 2014-06-11 LAB — LIPASE, BLOOD: Lipase: 79 U/L — ABNORMAL HIGH (ref 11–59)

## 2014-06-11 NOTE — Progress Notes (Signed)
Subjective: She had an exacerbation of the pain this morning, but she is better now with pain medications.  Objective: Vital signs in last 24 hours: Temp:  [98.2 F (36.8 C)-98.9 F (37.2 C)] 98.3 F (36.8 C) (01/20 0505) Pulse Rate:  [65-75] 65 (01/20 0505) Resp:  [16] 16 (01/20 0505) BP: (99-111)/(56-62) 110/56 mmHg (01/20 0505) SpO2:  [97 %-100 %] 98 % (01/20 0505) Last BM Date: 06/09/14  Intake/Output from previous day: 01/19 0701 - 01/20 0700 In: 2750.3 [I.V.:2690.3; IV Piggyback:60] Out: -  Intake/Output this shift:    General appearance: alert and no distress GI: soft, non-tender; bowel sounds normal; no masses,  no organomegaly  Lab Results:  Recent Labs  06/09/14 0824 06/09/14 1615  WBC 8.1 14.3*  HGB 12.6 11.7*  HCT 38.8 35.7*  PLT 603* 588*   BMET  Recent Labs  06/09/14 0824 06/09/14 1615 06/10/14 0515 06/11/14 0515  NA 140  --  140 140  K 4.0  --  3.9 3.2*  CL 108  --  111 111  CO2 23  --  24 20  GLUCOSE 106*  --  92 69*  BUN 13  --  14 13  CREATININE 0.81 0.94 0.92 0.75  CALCIUM 8.9  --  7.9* 8.1*   LFT  Recent Labs  06/11/14 0515  PROT 4.9*  ALBUMIN 2.4*  AST 81*  ALT 126*  ALKPHOS 257*  BILITOT 0.7   PT/INR No results for input(s): LABPROT, INR in the last 72 hours. Hepatitis Panel No results for input(s): HEPBSAG, HCVAB, HEPAIGM, HEPBIGM in the last 72 hours. C-Diff No results for input(s): CDIFFTOX in the last 72 hours. Fecal Lactopherrin No results for input(s): FECLLACTOFRN in the last 72 hours.  Studies/Results: No results found.  Medications:  Scheduled: . antiseptic oral rinse  7 mL Mouth Rinse q12n4p  . cefTRIAXone (ROCEPHIN)  IV  1 g Intravenous Q24H  . chlorhexidine  15 mL Mouth Rinse BID  . enoxaparin (LOVENOX) injection  40 mg Subcutaneous Q24H  . levothyroxine  25 mcg Intravenous Daily  . pantoprazole (PROTONIX) IV  40 mg Intravenous Q24H   Continuous: . sodium chloride 100 mL/hr at 06/10/14 2206     Assessment/Plan: 1) Acute panreatitis - ? Biliary etiology. 2) ABM pain. 3) Abnormal liver enzymes.   I am dismayed to see that she had an exacerbation of her pain.  Her liver enzymes this AM were declining.  I agree with the pain management and she can have clear liquids.  Pending her clinical course, I may perform an EUS while she is here in the hospital.  My purpose will be to evaluate the CBD and gallbladder.  Plan: 1) Pain control PRN. 2) Follow liver enzymes. 3) Continue with clear liquid diet and advance as tolerated.   LOS: 2 days   Mackenize Delgadillo D 06/11/2014, 12:54 PM

## 2014-06-11 NOTE — Progress Notes (Signed)
Chelsea Cantrell [place] PJK:[id number] DOB: [birth year] DOA: 06/09/2014 PCP: Elby Showers, MD  Brief narrative: 69 y/o ? with reported IBS, Gerd,hypothyroid, Ex induced astmaColonic polyps-prior admit 12/29-->1/4 abd pain c HIDA/US abd neg-Showed ? panc diverticulum.  Re-admitted 1/18 c Abd pain, Lipase 394, ? LFt,s c Nl bili  Past medical history-As per Problem list Chart reviewed as below- reviewed  Consultants:  Allene Pyo  Procedures:  Ct abd  Antibiotics:  none   Subjective   pain this am, 5/10 Currently 3/10 after being medicated Thinks she can eat No N/V  No stool since admit   Objective    Interim History:   Telemetry:    Objective: Filed Vitals:   06/10/14 0543 06/10/14 1320 06/10/14 2216 06/11/14 0505  BP: 96/50 99/58 111/62 110/56  Pulse: 86 73 75 65  Temp: 98.6 F (37 C) 98.9 F (37.2 C) 98.2 F (36.8 C) 98.3 F (36.8 C)  TempSrc: Oral Oral Oral Oral  Resp: 18 16 16 16   Height: 5\' 7"  (1.702 m)     Weight: 64.592 kg (142 lb 6.4 oz)     SpO2: 96% 100% 97% 98%    Intake/Output Summary (Last 24 hours) at 06/11/14 1230 Last data filed at 06/11/14 8099  Gross per 24 hour  Intake 2750.3 ml  Output      0 ml  Net 2750.3 ml    Exam:  General: eomi, ncat Cardiovascular: s1 s 2no m/r/g Respiratory: clear, no added soudn Abdomen:  RUQ + tender, equiv Murphy's sign Skin no LE edema Neurointact  Data Reviewed: Basic Metabolic Panel:  Recent Labs Lab 06/09/14 0824 06/09/14 1615 06/10/14 0515 06/11/14 0515  NA 140  --  140 140  K 4.0  --  3.9 3.2*  CL 108  --  111 111  CO2 23  --  24 20  GLUCOSE 106*  --  92 69*  BUN 13  --  14 13  CREATININE 0.81 0.94 0.92 0.75  CALCIUM 8.9  --  7.9* 8.1*  MG  --  1.6  --   --   PHOS  --  4.0  --   --    Liver Function Tests:  Recent Labs Lab 06/09/14 0824 06/10/14 0515 06/11/14 0515  AST 594* 191* 81*  ALT 259* 189* 126*  ALKPHOS 396* 273* 257*  BILITOT 0.9 0.7 0.7  PROT 6.0 4.9* 4.9*    ALBUMIN 3.2* 2.4* 2.4*    Recent Labs Lab 06/09/14 0824 06/10/14 0515 06/11/14 0515  LIPASE 393* 133* 79*   No results for input(s): AMMONIA in the last 168 hours. CBC:  Recent Labs Lab 06/09/14 0824 06/09/14 1615  WBC 8.1 14.3*  NEUTROABS 7.6  --   HGB 12.6 11.7*  HCT 38.8 35.7*  MCV 89.6 90.2  PLT 603* 588*   Cardiac Enzymes: No results for input(s): CKTOTAL, CKMB, CKMBINDEX, TROPONINI in the last 168 hours. BNP: Invalid input(s): POCBNP CBG:  Recent Labs Lab 06/11/14 0931  GLUCAP 60*    Recent Results (from the past 240 hour(s))  Urine culture     Status: None   Collection Time: 06/09/14 10:00 AM  Result Value Ref Range Status   Specimen Description URINE, CLEAN CATCH  Final   Special Requests NONE  Final   Colony Count   Final    >=100,000 COLONIES/ML Performed at [facility]    Culture   Final    Multiple bacterial morphotypes present, none predominant. Suggest appropriate recollection if clinically indicated.  Performed at [facility]    Report Status 06/10/2014 FINAL  Final     Studies:              All Imaging reviewed and is as per above notation   Scheduled Meds: . antiseptic oral rinse  7 mL Mouth Rinse q12n4p  . cefTRIAXone (ROCEPHIN)  IV  1 g Intravenous Q24H  . chlorhexidine  15 mL Mouth Rinse BID  . enoxaparin (LOVENOX) injection  40 mg Subcutaneous Q24H  . levothyroxine  25 mcg Intravenous Daily  . pantoprazole (PROTONIX) IV  40 mg Intravenous Q24H   Continuous Infusions: . sodium chloride 100 mL/hr at 06/10/14 2206     Assessment/Plan: 1. ?GB stone pacnreatitis-long discussion c patient60% cases of Pancreatitis are GB stone related.  I am not sure if she passed a CBD stone this time.  Gi thinks ? Sphincter Oddi dysfucntion ty II-Defer Manometry and EUS to Dr. Kandace Parkins.  Get Lipid panel am.  Jeanella Anton diet to soft, low fat and monitor.  LFt's rpt in am ? liver enzymes-As above 2. Gerd-cont IV ppi-could  to PO if  able to [employer] 3. Cont Iv synthroid 25 mcg for now 4. ?Cystitis-Follow UC.  Continue IV rocephin  Code Status: Full Family Communication:none bedside   Disposition Plan:  inpatient   Verneita Griffes, MD  [facility] Pager [phone] 06/11/2014, 12:30 PM    LOS: 2 days

## 2014-06-12 ENCOUNTER — Encounter (HOSPITAL_COMMUNITY)
Admission: EM | Disposition: A | Discharge status: Home / Self Care | Payer: Self-pay | Source: Home / Self Care | Attending: Internal Medicine

## 2014-06-12 ENCOUNTER — Inpatient Hospital Stay (HOSPITAL_COMMUNITY): Payer: 59 | Admitting: Certified Registered Nurse Anesthetist

## 2014-06-12 ENCOUNTER — Encounter (HOSPITAL_COMMUNITY): Payer: Self-pay | Admitting: Certified Registered Nurse Anesthetist

## 2014-06-12 DIAGNOSIS — R1084 Generalized abdominal pain: Secondary | ICD-10-CM

## 2014-06-12 HISTORY — PX: EUS: SHX5427

## 2014-06-12 LAB — COMPREHENSIVE METABOLIC PANEL
ALT: 125 U/L — ABNORMAL HIGH (ref 0–35)
ANION GAP: 13 (ref 5–15)
AST: 112 U/L — ABNORMAL HIGH (ref 0–37)
Albumin: 2.5 g/dL — ABNORMAL LOW (ref 3.5–5.2)
Alkaline Phosphatase: 340 U/L — ABNORMAL HIGH (ref 39–117)
BUN: 8 mg/dL (ref 6–23)
CO2: 22 mmol/L (ref 19–32)
CREATININE: 0.66 mg/dL (ref 0.50–1.10)
Calcium: 8.2 mg/dL — ABNORMAL LOW (ref 8.4–10.5)
Chloride: 104 mEq/L (ref 96–112)
GFR calc non Af Amer: >90 mL/min (ref >90)
Glucose, Bld: 103 mg/dL — ABNORMAL HIGH (ref 70–99)
Potassium: 3.3 mmol/L — ABNORMAL LOW (ref 3.5–5.1)
SODIUM: 139 mmol/L (ref 135–145)
TOTAL PROTEIN: 5.1 g/dL — AB (ref 6.0–8.3)
Total Bilirubin: 0.8 mg/dL (ref 0.3–1.2)

## 2014-06-12 SURGERY — UPPER ENDOSCOPIC ULTRASOUND (EUS) LINEAR
Anesthesia: Monitor Anesthesia Care

## 2014-06-12 MED ORDER — ONDANSETRON HCL 4 MG/2ML IJ SOLN
INTRAMUSCULAR | Status: DC | PRN
  Administered 2014-06-12: 4 mg via INTRAVENOUS

## 2014-06-12 MED ORDER — PROPOFOL INFUSION 10 MG/ML OPTIME
INTRAVENOUS | Status: DC | PRN
  Administered 2014-06-12: 140 ug/kg/min via INTRAVENOUS

## 2014-06-12 MED ORDER — LACTATED RINGERS IV SOLN
INTRAVENOUS | Status: DC | PRN
  Administered 2014-06-12: 14:00:00 via INTRAVENOUS

## 2014-06-12 MED ORDER — PROPOFOL 10 MG/ML IV BOLUS
INTRAVENOUS | Status: DC | PRN
  Administered 2014-06-12: 50 mg via INTRAVENOUS

## 2014-06-12 MED ORDER — PROPOFOL 10 MG/ML IV BOLUS
INTRAVENOUS | Status: AC
  Filled 2014-06-12: qty 20

## 2014-06-12 MED ORDER — SODIUM CHLORIDE 0.9 % IV SOLN: INTRAVENOUS | Status: DC

## 2014-06-12 NOTE — Anesthesia Postprocedure Evaluation (Signed)
  Anesthesia Post-op Note  Patient: Chelsea Cantrell  Procedure(s) Performed: Procedure(s): UPPER ENDOSCOPIC ULTRASOUND (EUS) LINEAR (N/A)  Patient Location: PACU  Anesthesia Type:MAC  Level of Consciousness: awake and alert   Airway and Oxygen Therapy: Patient Spontanous Breathing and Patient connected to nasal cannula oxygen  Post-op Pain: mild  Post-op Assessment: Post-op Vital signs reviewed  Post-op Vital Signs: Reviewed and stable  Last Vitals:  Filed Vitals:   06/12/14 1445  BP:   Pulse: 51  Temp:   Resp: 24    Complications: No apparent anesthesia complications

## 2014-06-12 NOTE — Transfer of Care (Signed)
Immediate Anesthesia Transfer of Care Note  Patient: Chelsea Cantrell  Procedure(s) Performed: Procedure(s): UPPER ENDOSCOPIC ULTRASOUND (EUS) LINEAR (N/A)  Patient Location: PACU  Anesthesia Type:MAC  Level of Consciousness: awake, alert  and oriented  Airway & Oxygen Therapy: Patient Spontanous Breathing and Patient connected to nasal cannula oxygen  Post-op Assessment: Report given to PACU RN and Post -op Vital signs reviewed and stable  Post vital signs: Reviewed and stable  Complications: No apparent anesthesia complications

## 2014-06-12 NOTE — Progress Notes (Signed)
Nursing Discharge Summary  Patient ID: Chelsea Cantrell MRN: [id number] DOB/AGE: May 30, 1945 69 y.o.  Admit date: 06/09/2014 Discharge date: 06/12/2014  Discharged Condition: good  Disposition: 01-Home or Self Care    Prescriptions Given: No new medications. Medications and follow up appointments discussed. No further questions  Means of Discharge: Patient wished to ambulate downstairs to be discharged home  Signed: Buel Ream 06/12/2014, 4:17 PM

## 2014-06-12 NOTE — H&P (View-Only) (Signed)
Subjective: She had an exacerbation of the pain this morning, but she is better now with pain medications.  Objective: Vital signs in last 24 hours: Temp:  [98.2 F (36.8 C)-98.9 F (37.2 C)] 98.3 F (36.8 C) (01/20 0505) Pulse Rate:  [65-75] 65 (01/20 0505) Resp:  [16] 16 (01/20 0505) BP: (99-111)/(56-62) 110/56 mmHg (01/20 0505) SpO2:  [97 %-100 %] 98 % (01/20 0505) Last BM Date: 06/09/14  Intake/Output from previous day: 01/19 0701 - 01/20 0700 In: 2750.3 [I.V.:2690.3; IV Piggyback:60] Out: -  Intake/Output this shift:    General appearance: alert and no distress GI: soft, non-tender; bowel sounds normal; no masses,  no organomegaly  Lab Results:  Recent Labs  06/09/14 0824 06/09/14 1615  WBC 8.1 14.3*  HGB 12.6 11.7*  HCT 38.8 35.7*  PLT 603* 588*   BMET  Recent Labs  06/09/14 0824 06/09/14 1615 06/10/14 0515 06/11/14 0515  NA 140  --  140 140  K 4.0  --  3.9 3.2*  CL 108  --  111 111  CO2 23  --  24 20  GLUCOSE 106*  --  92 69*  BUN 13  --  14 13  CREATININE 0.81 0.94 0.92 0.75  CALCIUM 8.9  --  7.9* 8.1*   LFT  Recent Labs  06/11/14 0515  PROT 4.9*  ALBUMIN 2.4*  AST 81*  ALT 126*  ALKPHOS 257*  BILITOT 0.7   PT/INR No results for input(s): LABPROT, INR in the last 72 hours. Hepatitis Panel No results for input(s): HEPBSAG, HCVAB, HEPAIGM, HEPBIGM in the last 72 hours. C-Diff No results for input(s): CDIFFTOX in the last 72 hours. Fecal Lactopherrin No results for input(s): FECLLACTOFRN in the last 72 hours.  Studies/Results: No results found.  Medications:  Scheduled: . antiseptic oral rinse  7 mL Mouth Rinse q12n4p  . cefTRIAXone (ROCEPHIN)  IV  1 g Intravenous Q24H  . chlorhexidine  15 mL Mouth Rinse BID  . enoxaparin (LOVENOX) injection  40 mg Subcutaneous Q24H  . levothyroxine  25 mcg Intravenous Daily  . pantoprazole (PROTONIX) IV  40 mg Intravenous Q24H   Continuous: . sodium chloride 100 mL/hr at 06/10/14 2206     Assessment/Plan: 1) Acute panreatitis - ? Biliary etiology. 2) ABM pain. 3) Abnormal liver enzymes.   I am dismayed to see that she had an exacerbation of her pain.  Her liver enzymes this AM were declining.  I agree with the pain management and she can have clear liquids.  Pending her clinical course, I may perform an EUS while she is here in the hospital.  My purpose will be to evaluate the CBD and gallbladder.  Plan: 1) Pain control PRN. 2) Follow liver enzymes. 3) Continue with clear liquid diet and advance as tolerated.   LOS: 2 days   Chelsea Cantrell D 06/11/2014, 12:54 PM

## 2014-06-12 NOTE — Discharge Summary (Signed)
Physician Discharge Summary  Chelsea Cantrell RXV:[id number] DOB: 20-Dec-1945 DOA: 06/09/2014  PCP: Margaree Mackintosh, MD  Admit date: 06/09/2014 Discharge date: 06/12/2014  Time spent: 35 minutes  Recommendations for Outpatient Follow-up:  1. Needs OP Gi input from Dr. Elnoria Howard    Discharge Diagnoses:  Active Problems:   GE reflux   Hypothyroidism   Pancreatitis   Abdominal pain   Transaminitis   UTI (urinary tract infection)   Epigastric pain   Discharge Condition: good  Diet recommendation: low fat hh  Filed Weights   06/10/14 0543  Weight: 64.592 kg (142 lb 6.4 oz)    History of present illness:  69 y/o ? with reported IBS, Gerd,hypothyroid, Ex induced astmaColonic polyps-prior admit 12/29-->1/4 abd pain c HIDA/US abd neg-Showed ? panc diverticulum. Re-admitted 1/18 c Abd pain, Lipase 394, ? LFt,s c Nl bili She re-presented c abd pain Gastroenterology Dr. Elnoria Howard was consulted and EUS was performed 06/12/14 which showed essentially normal called bladder and surrounding anatomy-it was thought that her issue was sphincter OD related dysfunction type II and patient was set up for close follow-up as an outpatient She had met maximal benefit from hospitalization 06/12/14 and was discharged home  Discharge Exam: Filed Vitals:   06/12/14 1445  BP:   Pulse: 51  Temp:   Resp: 24    General: alert pleasant oriented Cardiovascular: EOMI NCAT Respiratory: chest clear No upper abdominal discomfort pain  Discharge Instructions   Discharge Instructions    Diet - low sodium heart healthy    Complete by:  As directed      Discharge instructions    Complete by:  As directed   Follow c Dr. Elnoria Howard as an OP in 2-3 weeks for further management If you get abdominal pain or Nausea or vomiting, please seek medical attention     Increase activity slowly    Complete by:  As directed           Current Discharge Medication List    CONTINUE these medications which have NOT CHANGED   Details   Digestive Enzymes (ENZYME DIGEST) CAPS Take 1 capsule by mouth daily.     LINZESS 290 MCG CAPS capsule Take 290 mcg by mouth daily.     Multiple Vitamin (MULTIVITAMIN) tablet Take 1 tablet by mouth daily.      naproxen sodium (ANAPROX) 220 MG tablet Take 220 mg by mouth every 8 (eight) hours as needed (for pain and fever).    ondansetron (ZOFRAN ODT) 8 MG disintegrating tablet Take 1 tablet (8 mg total) by mouth every 8 (eight) hours as needed for nausea or vomiting. Qty: 10 tablet, Refills: 0    SYNTHROID 50 MCG tablet Take 1 tablet by mouth  daily Qty: 90 tablet, Refills: 3    VASCEPA 1 G CAPS Take 2 capsules by mouth  twice daily at noon and 4PM Qty: 360 capsule, Refills: 3    VENTOLIN HFA 108 (90 BASE) MCG/ACT inhaler Inhale 2 puffs into the lungs every 6 (six) hours as needed for wheezing or shortness of breath.     celecoxib (CELEBREX) 200 MG capsule Take 1 capsule (200 mg total) by mouth daily. Qty: 90 capsule, Refills: 3      STOP taking these medications     MAGNESIUM ASPARTATE PO      cephALEXin (KEFLEX) 500 MG capsule        No Known Allergies    The results of significant diagnostics from this hospitalization (including imaging, microbiology, ancillary and  laboratory) are listed below for reference.    Significant Diagnostic Studies: Dg Chest 2 View  05/26/2014   CLINICAL DATA:  Nausea and shortness of breath.  EXAM: CHEST - 2 VIEW  COMPARISON:  11/21/2008  FINDINGS: The heart size and mediastinal contours are within normal limits. There is no evidence of pulmonary edema, consolidation, pneumothorax, nodule or pleural fluid. The visualized skeletal structures are unremarkable.  IMPRESSION: No active disease.   Electronically Signed   By: Aletta Edouard M.D.   On: 05/26/2014 09:34   US Abdomen Complete  05/26/2014   CLINICAL DATA:  Right upper quadrant abdominal pain for 1 week.  EXAM: ULTRASOUND ABDOMEN COMPLETE  COMPARISON:  None.  FINDINGS: Gallbladder: No  gallstones or wall thickening visualized. No sonographic Murphy sign noted.  Common bile duct: Diameter: 5 mm  Liver: No focal lesion identified. Within normal limits in parenchymal echogenicity.  IVC: No abnormality visualized.  Pancreas: Visualized portion unremarkable.  Spleen: Size and appearance within normal limits.  Right Kidney: Length: 10.7 cm. Echogenicity within normal limits. No mass or hydronephrosis visualized.  Left Kidney: Length: 10.1 cm. Echogenicity within normal limits. No mass or hydronephrosis visualized.  Abdominal aorta: No aneurysm visualized.  Other findings: None.  IMPRESSION: Negative abdominal ultrasound.   Electronically Signed   By: Earle Gell M.D.   On: 05/26/2014 11:11   Ct Abdomen Pelvis W Contrast  06/09/2014   CLINICAL DATA:  Chronic constipation. Mid abdominal pain with nausea and fever. Elevated serum lipase.  EXAM: CT ABDOMEN AND PELVIS WITH CONTRAST  TECHNIQUE: Multidetector CT imaging of the abdomen and pelvis was performed using the standard protocol following bolus administration of intravenous contrast. Oral contrast was also administered.  CONTRAST:  49mL OMNIPAQUE IOHEXOL 300 MG/ML SOLN, 151mL OMNIPAQUE IOHEXOL 300 MG/ML SOLN  COMPARISON:  None.  FINDINGS: There is focal infiltrate in the inferior lingula. There is mild bibasilar lung scarring.  The liver is prominent, measuring 18.3 cm in length. No focal liver lesions are appreciable. There is evidence of a degree of hepatic steatosis. Gallbladder wall does not appear thickened. There is no biliary duct dilatation.  Spleen appears normal. No pancreatic lesions are identified. Pancreatic duct is not dilated. 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 No inflammation is seen in this area.  Adrenals appear normal. Kidneys bilaterally show no mass or hydronephrosis. There is no renal or ureteral calculus on either side.  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 There is no pelvic mass or pelvic fluid collection. The urinary bladder is midline with normal wall thickness. Appendix appears unremarkable.  There is diffuse stool throughout the colon. Rectum is distended with stool.  There is no bowel obstruction.  No free air or portal venous air.  There is no appreciable ascites, adenopathy, or abscess in the abdomen or pelvis. There is atherosclerotic change in the aorta but no apparent aneurysm. There is degenerative change in the lumbar spine with marked narrowing at L3-4 and L4-5. There are no blastic or lytic bone lesions.  IMPRESSION: Diffuse stool throughout colon. Rectum distended with stool. These findings are consistent with constipation.  No bowel obstruction.  No abscess.  Appendix region appears normal.  Focal infiltrate inferior lingula.  Prominent liver with hepatic steatosis.  No renal or ureteral calculus.  No hydronephrosis.  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  Extensive mid lumbar osteoarthritic change.  No pancreatic  lesions. Note that there is a focal diverticulum in the second portion of the duodenum immediately adjacent to the pancreatic head, not causing inflammation. This diverticulum measures slightly greater than 1 cm in size.   Electronically Signed   By: Lowella Grip M.D.   On: 06/09/2014 10:29   Nm Hepato W/eject Fract  05/27/2014   CLINICAL DATA:  Right upper quadrant pain.  EXAM: NUCLEAR MEDICINE HEPATOBILIARY IMAGING WITH GALLBLADDER EF  TECHNIQUE: Sequential images of the abdomen were obtained out to 60 minutes following intravenous administration of radiopharmaceutical. After slow intravenous infusion of 1.3 micrograms Cholecystokinin, gallbladder ejection fraction was determined.  RADIOPHARMACEUTICALS:  5.0 [place]-60V Choletec  COMPARISON:  None.  FINDINGS: Liver, biliary system, gallbladder, bowel appear normal. Gallbladder ejection fraction at 30 min is at 100%. At 30 min,  normal ejection fraction is greater than 30%.  The patient did not experience symptoms during CCK infusion.  IMPRESSION: Normal exam.   Electronically Signed   By: Marcello Moores  Register   On: 05/27/2014 10:37    Microbiology: Recent Results (from the past 240 hour(s))  Urine culture     Status: None   Collection Time: 06/09/14 10:00 AM  Result Value Ref Range Status   Specimen Description URINE, CLEAN CATCH  Final   Special Requests NONE  Final   Colony Count   Final    >=100,000 COLONIES/ML Performed at [facility]    Culture   Final    Multiple bacterial morphotypes present, none predominant. Suggest appropriate recollection if clinically indicated. Performed at [facility]    Report Status 06/10/2014 FINAL  Final     Labs: Basic Metabolic Panel:  Recent Labs Lab 06/09/14 0824 06/09/14 1615 06/10/14 0515 06/11/14 0515 06/12/14 0410  NA 140  --  140 140 139  K 4.0  --  3.9 3.2* 3.3*  CL 108  --  111 111 104  CO2 23  --  _0 GLUCOSE 106*  --  92 69* 103*  BUN 13  --  _1 CREATININE 0.81 0.94 0.92 0.75 0.66  CALCIUM 8.9  --  7.9* 8.1* 8.2*  MG  --  1.6  --   --   --   PHOS  --  4.0  --   --   --    Liver Function Tests:  Recent Labs Lab 06/09/14 0824 06/10/14 0515 06/11/14 0515 06/12/14 0410  AST 594* 191* 81* 112*  ALT 259* 189* 126* 125*  ALKPHOS 396* 273* 257* 340*  BILITOT 0.9 0.7 0.7 0.8  PROT 6.0 4.9* 4.9* 5.1*  ALBUMIN 3.2* 2.4* 2.4* 2.5*    Recent Labs Lab 06/09/14 0824 06/10/14 0515 06/11/14 0515  LIPASE 393* 133* 79*   No results for input(s): AMMONIA in the last 168 hours. CBC:  Recent Labs Lab 06/09/14 0824 06/09/14 1615  WBC 8.1 14.3*  NEUTROABS 7.6  --   HGB 12.6 11.7*  HCT 38.8 35.7*  MCV 89.6 90.2  PLT 603* 588*   Cardiac Enzymes: No results for input(s): CKTOTAL, CKMB, CKMBINDEX, TROPONINI in the last 168 hours. BNP: BNP (last 3 results) No results for input(s): PROBNP in the last 8760  hours. CBG:  Recent Labs Lab 06/11/14 0931  GLUCAP 60*       Signed:  Nita Sells  [facility] 06/12/2014, 3:10 PM

## 2014-06-12 NOTE — Anesthesia Preprocedure Evaluation (Addendum)
Anesthesia Evaluation  Patient identified by MRN, date of birth, ID band Patient awake    Reviewed: Allergy & Precautions, NPO status , Patient's Chart, lab work & pertinent test results, reviewed documented beta blocker date and time   Airway Mallampati: II   Neck ROM: Full    Dental  (+) Dental Advisory Given, Teeth Intact   Pulmonary asthma , former smoker,  breath sounds clear to auscultation        Cardiovascular negative cardio ROS  Rhythm:Regular     Neuro/Psych Depression    GI/Hepatic GERD-  Medicated,  Endo/Other  negative endocrine ROS  Renal/GU negative Renal ROS     Musculoskeletal   Abdominal (+)  Abdomen: soft.    Peds  Hematology negative hematology ROS (+)   Anesthesia Other Findings   Reproductive/Obstetrics                            Anesthesia Physical Anesthesia Plan  ASA: II  Anesthesia Plan:    Post-op Pain Management:    Induction:   Airway Management Planned:   Additional Equipment:   Intra-op Plan:   Post-operative Plan:   Informed Consent:   Plan Discussed with:   Anesthesia Plan Comments:         Anesthesia Quick Evaluation

## 2014-06-12 NOTE — Interval H&P Note (Signed)
History and Physical Interval Note:  06/12/2014 1:38 PM  Chelsea Cantrell  has presented today for surgery, with the diagnosis of Pancreatitis  The various methods of treatment have been discussed with the patient and family. After consideration of risks, benefits and other options for treatment, the patient has consented to  Procedure(s): UPPER ENDOSCOPIC ULTRASOUND (EUS) LINEAR (N/A) as a surgical intervention .  The patient's history has been reviewed, patient examined, no change in status, stable for surgery.  I have reviewed the patient's chart and labs.  Questions were answered to the patient's satisfaction.     Mayukha Symmonds D

## 2014-06-12 NOTE — Op Note (Signed)
[facility] [place], [id number]   UPPER ENDOSCOPIC ULTRASOUND PROCEDURE REPORT     EXAM DATE: 06/12/2014  PATIENT NAME:          Chelsea Cantrell, Chelsea Cantrell          MR#: [id number] BIRTHDATE:       11/04/1945     VISIT #:     [id number] ATTENDING:     Carol Ada, MD     STATUS:     outpatient ASSISTANT:      Jiles Harold and Mariana Single  REFERRING MD:  Earlie Raveling, M.D. ASA CLASS:        Class II  INDICATIONS:  The patient is a 69 yr old female here for a lower endoscopic ultrasound due to  abnormal liver enzymes and pancreatitis. PROCEDURE PERFORMED:     Upper Endoscopic Ultrasound without FNA   MEDICATIONS:     Monitored anesthesia care  CONSENT: The patient understands the risks and benefits of the procedure and understands that these risks include, but are not limited to: sedation, allergic reaction, infection, perforation and/or bleeding. Alternative means of evaluation and treatment include, among others: physical exam, x-rays, and/or surgical intervention. The patient elects to proceed with this endoscopic procedure.  DESCRIPTION OF PROCEDURE: During intra-op preparation period all mechanical & medical equipment was checked for proper function. Hand hygiene and appropriate measures for infection prevention was taken. After the risks, benefits and alternatives of the procedure were thoroughly explained, Informed consent was verified, confirmed and timeout was successfully executed by the treatment team. The patient was then placed in the left, lateral, decubitus position and IV sedation was administered. Throughout the procedure, the patients blood pressure, pulse and oxygen saturations were monitored continuously. Under direct visualization, the    was introduced through the mouth and advanced to the bulb of duodenum. Water was used as necessary to provide an acoustic interface. The pulse, BP, and O2 saturation were monitored  and documented by the physician and nursing staff throughout the procedure. Upon completion of the imaging, water was removed and the patient was then discharged to recovery in stable condition with the appropriate post procedure care.  FINDINGS: Examination of the pancreas did not reveal any significant inflammation.  There was no evidence of any masses or cysts in the pancreas and the PD measured 3 mm in the body.  The CBD was idendified and it measured 3.3 mm in the distal portion and 5.5 mm in the proximal CBD.  There was no evidence of any stones, sludge, strictures, or masses.  The gallbladder was evaluated and there was no evidence of stones or sludge.    ADVERSE EVENTS:     There were no complications IMPRESSIONS:     1) Normal CBD, gallbladder, and pancreas.  ? SOD, Type II.  RECOMMENDATIONS:     1) Okay to D/C Home. 2) Follow up in the office in 2 weeks.  ? ERCP with sphincterotomy in the near future.  ___________________________________ Carol Ada, MD eSigned:  Carol Ada, MD 06/12/2014 2:20 PM   cc:

## 2014-06-13 ENCOUNTER — Encounter (HOSPITAL_COMMUNITY): Payer: Self-pay | Admitting: Gastroenterology

## 2014-06-16 ENCOUNTER — Other Ambulatory Visit: Payer: Self-pay | Admitting: Internal Medicine

## 2014-06-16 ENCOUNTER — Encounter: Payer: Self-pay | Admitting: Internal Medicine

## 2014-06-16 ENCOUNTER — Ambulatory Visit (INDEPENDENT_AMBULATORY_CARE_PROVIDER_SITE_OTHER): Payer: 59 | Admitting: Internal Medicine

## 2014-06-16 VITALS — BP 118/76 | HR 60 | Temp 97.6°F | Wt 143.0 lb

## 2014-06-16 DIAGNOSIS — R350 Frequency of micturition: Secondary | ICD-10-CM | POA: Diagnosis not present

## 2014-06-16 DIAGNOSIS — K859 Acute pancreatitis, unspecified: Secondary | ICD-10-CM

## 2014-06-16 LAB — POCT URINALYSIS DIPSTICK
Bilirubin, UA: NEGATIVE
Blood, UA: NEGATIVE
Glucose, UA: NEGATIVE
Ketones, UA: NEGATIVE
NITRITE UA: NEGATIVE
PH UA: 7
Protein, UA: NEGATIVE
Spec Grav, UA: <=1.005
Urobilinogen, UA: NEGATIVE

## 2014-06-16 NOTE — Progress Notes (Signed)
   Subjective:    Patient ID: Chelsea Cantrell, female    DOB: 25-Jul-1945, 69 y.o.   MRN: [id number]  HPI  69 year old Female seen at [facility] December 29th with nausea fever and chills. Was thought to probably have a viral syndrome. Subsequently went to [facility] January 4th with persistent fever, nausea, and myalgias. She was found to have an SGOT of 142 and an SGPT of 90 with a now: Phosphatase of 491 and total bilirubin of 1.7. Ultrasound of the upper abdomen was negative. ED physician spoke with her gastroenterologist, Dr. Earlean Shawl who felt there may be some intermittent biliary obstruction. A hepatobiliary scan was ordered the following day which proved to be normal. Her lipase at that time was normal. She presented to the hospital once again on January 18. She was admitted through January 21. Was diagnosed with pancreatitis. Dr. Benson Norway saw her in consultation and performed endoscopic ultrasound. Lipase was 393. Endoscopic Ultrasound was unremarkable. Today she is complaining of some urinary frequency. Small LE noted on urine dipstick. Culture was ordered.  Feeling a bit weak and washed out because of protracted illness.    Review of Systems     Objective:   Physical Exam  Skin warm and dry. Nodes none. Chest: clear to auscultation. Abdomen: bowel sounds present without hepatosplenomegaly masses or tenderness      Assessment & Plan:  Recent bout of pancreatitis-etiology unclear  Sounds like patient had viral syndrome prior to the diagnosis of pancreatitis.? Viral cause for pancreatitis. We'll check viral titers such as CMV and Epstein-Barr virus.  ? UTI  Plan:

## 2014-06-17 LAB — EPSTEIN-BARR VIRUS VCA ANTIBODY PANEL
EBV EA IgG: 35.3 U/mL — ABNORMAL HIGH (ref <9.0)
EBV NA IGG: 35.1 U/mL — AB (ref <18.0)
EBV VCA IGG: 252 U/mL — AB (ref <18.0)

## 2014-06-18 ENCOUNTER — Telehealth: Payer: Self-pay | Admitting: *Deleted

## 2014-06-18 LAB — CMV IGM: CMV IgM: <8 AU/mL (ref <30.00)

## 2014-06-18 LAB — URINE CULTURE
COLONY COUNT: NO GROWTH
ORGANISM ID, BACTERIA: NO GROWTH

## 2014-06-18 LAB — CYTOMEGALOVIRUS ANTIBODY, IGG: CYTOMEGALOVIRUS AB-IGG: 3.2 U/mL — AB (ref <0.60)

## 2014-06-18 NOTE — Telephone Encounter (Signed)
Reviewed lab results with patient . Patient instructed not to take antibiotic ordered by Hospital patient urine culture showed no growth

## 2014-06-18 NOTE — Telephone Encounter (Signed)
Patient states she has not had Mono in the past that she is aware of she also wanted you to know she has appt with Dr Benson Norway next Monday

## 2014-06-18 NOTE — Telephone Encounter (Signed)
We will forward EB titers to Dr. Benson Norway. She has had mono in the past but cannot say when.

## 2014-06-18 NOTE — Telephone Encounter (Signed)
Information regarding lab results for [facility] Patient faxed to Dr Benson Norway for review

## 2014-06-19 ENCOUNTER — Encounter: Payer: Self-pay | Admitting: Internal Medicine

## 2014-06-19 ENCOUNTER — Ambulatory Visit (INDEPENDENT_AMBULATORY_CARE_PROVIDER_SITE_OTHER): Payer: 59 | Admitting: Internal Medicine

## 2014-06-19 VITALS — BP 114/78 | HR 64 | Temp 97.8°F

## 2014-06-19 DIAGNOSIS — L42 Pityriasis rosea: Secondary | ICD-10-CM

## 2014-06-19 DIAGNOSIS — R7989 Other specified abnormal findings of blood chemistry: Secondary | ICD-10-CM

## 2014-06-19 DIAGNOSIS — R945 Abnormal results of liver function studies: Principal | ICD-10-CM

## 2014-06-19 DIAGNOSIS — Z8719 Personal history of other diseases of the digestive system: Secondary | ICD-10-CM

## 2014-06-19 LAB — CBC WITH DIFFERENTIAL/PLATELET
BASOS PCT: 2 % — AB (ref 0–1)
Basophils Absolute: 0.1 10*3/uL (ref 0.0–0.1)
Eosinophils Absolute: 1 10*3/uL — ABNORMAL HIGH (ref 0.0–0.7)
Eosinophils Relative: 14 % — ABNORMAL HIGH (ref 0–5)
HCT: 36.8 % (ref 36.0–46.0)
Hemoglobin: 12.6 g/dL (ref 12.0–15.0)
LYMPHS PCT: 24 % (ref 12–46)
Lymphs Abs: 1.7 10*3/uL (ref 0.7–4.0)
MCH: 29.4 pg (ref 26.0–34.0)
MCHC: 34.2 g/dL (ref 30.0–36.0)
MCV: 85.8 fL (ref 78.0–100.0)
MPV: 8.5 fL — ABNORMAL LOW (ref 8.6–12.4)
Monocytes Absolute: 0.4 10*3/uL (ref 0.1–1.0)
Monocytes Relative: 6 % (ref 3–12)
NEUTROS ABS: 3.9 10*3/uL (ref 1.7–7.7)
Neutrophils Relative %: 54 % (ref 43–77)
Platelets: 490 10*3/uL — ABNORMAL HIGH (ref 150–400)
RBC: 4.29 MIL/uL (ref 3.87–5.11)
RDW: 13.8 % (ref 11.5–15.5)
WBC: 7.2 10*3/uL (ref 4.0–10.5)

## 2014-06-19 LAB — COMPREHENSIVE METABOLIC PANEL
ALT: 128 U/L — AB (ref 0–35)
AST: 95 U/L — AB (ref 0–37)
Albumin: 3.8 g/dL (ref 3.5–5.2)
Alkaline Phosphatase: 536 U/L — ABNORMAL HIGH (ref 39–117)
BUN: 10 mg/dL (ref 6–23)
CO2: 28 meq/L (ref 19–32)
Calcium: 8.8 mg/dL (ref 8.4–10.5)
Chloride: 102 mEq/L (ref 96–112)
Creat: 0.72 mg/dL (ref 0.50–1.10)
GLUCOSE: 93 mg/dL (ref 70–99)
Potassium: 4.2 mEq/L (ref 3.5–5.3)
SODIUM: 139 meq/L (ref 135–145)
Total Bilirubin: 0.5 mg/dL (ref 0.2–1.2)
Total Protein: 6.1 g/dL (ref 6.0–8.3)

## 2014-06-19 MED ORDER — TRIAMCINOLONE ACETONIDE 0.1 % EX CREA: 1.0000 "application " | TOPICAL_CREAM | Freq: Three times a day (TID) | CUTANEOUS | Status: DC

## 2014-06-19 MED ORDER — HYDROXYZINE HCL 25 MG PO TABS: ORAL_TABLET | ORAL | Status: DC

## 2014-06-20 ENCOUNTER — Other Ambulatory Visit: Payer: Self-pay | Admitting: Internal Medicine

## 2014-06-20 ENCOUNTER — Other Ambulatory Visit: Payer: 59 | Admitting: Internal Medicine

## 2014-06-20 NOTE — Progress Notes (Signed)
Patient presents for blood draw for [facility] test number [id number] requested by Dr Renold Genta

## 2014-06-22 NOTE — Progress Notes (Signed)
   Subjective:    Patient ID: Chelsea Cantrell, female    DOB: Dec 01, 1945, 69 y.o.   MRN: [id number]  HPI  Since last office visit has developed a rash lower back extending upward to about mid thoracic spine area. Doesn't recall eating anything to trigger this or changing detergents or deodorants etc. Also feels itchy and other parts of her body but no obvious rash.    Review of Systems     Objective:   Physical Exam  Confluently erythematous rash lower back just above buttocks extending into a triangular less confluent rash about mid thoracic back. No other significant rash noted despite patient complaining of itching elsewhere      Assessment & Plan:  Nonspecific dermatitis? Pityriasis rosea  Plan: Triamcinolone cream 0.1% to use on rash 3 times daily. Atarax 25 mg 1 or 2 capsules 4 times daily as needed for itching. Do not know if this ties in with her recent pancreatitis or not. Draw Epstein-Barr and CMV viral titers. Recheck liver functions.  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 Alkaline phosphatase is increased about 200 points. We'll contact Dr. Benson Norway.  Addendum: Dr. 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 Atarax is made patient drowsy but has helped with itching. Rash is no worse.  25 minutes spent with patient with counseling, education, evaluation of rash

## 2014-06-22 NOTE — Patient Instructions (Signed)
Await viral titers. Use triamcinolone cream and take Atarax.

## 2014-06-24 LAB — IGG SUBCLASS 4: IgG Subclass 4: 45 mg/dL (ref 4.0–86.0)

## 2014-08-20 ENCOUNTER — Encounter: Payer: Self-pay | Admitting: Internal Medicine

## 2014-08-20 NOTE — Patient Instructions (Signed)
Advance diet slowly. Await urine culture results. Call if symptoms worsen. Has follow-up with Dr. Benson Norway.

## 2014-09-16 ENCOUNTER — Encounter: Payer: Self-pay | Admitting: Internal Medicine

## 2014-09-16 ENCOUNTER — Ambulatory Visit
Admission: RE | Admit: 2014-09-16 | Discharge: 2014-09-16 | Disposition: A | Discharge status: Home / Self Care | Payer: 59 | Source: Ambulatory Visit | Attending: Internal Medicine | Admitting: Internal Medicine

## 2014-09-16 ENCOUNTER — Ambulatory Visit (INDEPENDENT_AMBULATORY_CARE_PROVIDER_SITE_OTHER): Payer: 59 | Admitting: Internal Medicine

## 2014-09-16 VITALS — BP 98/62 | HR 64 | Temp 97.8°F | Wt 143.0 lb

## 2014-09-16 DIAGNOSIS — R1013 Epigastric pain: Secondary | ICD-10-CM | POA: Diagnosis not present

## 2014-09-16 DIAGNOSIS — R079 Chest pain, unspecified: Secondary | ICD-10-CM

## 2014-09-16 LAB — COMPREHENSIVE METABOLIC PANEL
ALT: 344 U/L — ABNORMAL HIGH (ref 0–35)
AST: 480 U/L — AB (ref 0–37)
Albumin: 4 g/dL (ref 3.5–5.2)
Alkaline Phosphatase: 206 U/L — ABNORMAL HIGH (ref 39–117)
BUN: 17 mg/dL (ref 6–23)
CALCIUM: 9.2 mg/dL (ref 8.4–10.5)
CHLORIDE: 99 meq/L (ref 96–112)
CO2: 22 meq/L (ref 19–32)
Creat: 0.89 mg/dL (ref 0.50–1.10)
GLUCOSE: 99 mg/dL (ref 70–99)
Potassium: 4.4 mEq/L (ref 3.5–5.3)
Sodium: 134 mEq/L — ABNORMAL LOW (ref 135–145)
Total Bilirubin: 0.7 mg/dL (ref 0.2–1.2)
Total Protein: 6.1 g/dL (ref 6.0–8.3)

## 2014-09-16 LAB — CBC WITH DIFFERENTIAL/PLATELET
BASOS PCT: 0 % (ref 0–1)
Basophils Absolute: 0 10*3/uL (ref 0.0–0.1)
EOS ABS: 0.6 10*3/uL (ref 0.0–0.7)
EOS PCT: 9 % — AB (ref 0–5)
HEMATOCRIT: 37.2 % (ref 36.0–46.0)
Hemoglobin: 13.1 g/dL (ref 12.0–15.0)
Lymphocytes Relative: 10 % — ABNORMAL LOW (ref 12–46)
Lymphs Abs: 0.7 10*3/uL (ref 0.7–4.0)
MCH: 29.3 pg (ref 26.0–34.0)
MCHC: 35.2 g/dL (ref 30.0–36.0)
MCV: 83.2 fL (ref 78.0–100.0)
MPV: 9.3 fL (ref 8.6–12.4)
Monocytes Absolute: 0.6 10*3/uL (ref 0.1–1.0)
Monocytes Relative: 8 % (ref 3–12)
NEUTROS PCT: 73 % (ref 43–77)
Neutro Abs: 5.3 10*3/uL (ref 1.7–7.7)
Platelets: 263 10*3/uL (ref 150–400)
RBC: 4.47 MIL/uL (ref 3.87–5.11)
RDW: 14.1 % (ref 11.5–15.5)
WBC: 7.2 10*3/uL (ref 4.0–10.5)

## 2014-09-16 LAB — LIPASE: Lipase: 197 U/L — ABNORMAL HIGH (ref 0–75)

## 2014-09-16 LAB — AMYLASE: Amylase: 63 U/L (ref 0–105)

## 2014-09-16 NOTE — Progress Notes (Signed)
   Subjective:    Patient ID: Chelsea Cantrell, female    DOB: May 16, 1946, 69 y.o.   MRN: [id number]  HPI Patient ate a salad with oil and vinegar yesterday for lunch. A couple of hours later began to have some substernal discomfort with some burping but no water brash. Felt nauseated. Went home about 2:30. Really did not feel very well but had no vomiting fever or chills. Prior history of pancreatitis requiring admission January 2016. Has been followed and evaluated by gastroenterologists including endoscopic ultrasound which was reported as normal. In January, had elevated liver functions and lipase. This morning felt better. Went to work out at [employer]. Went on to work. Late morning after eating a breakfast of 2 eggs with spinach began to feel nauseated with some substernal chest discomfort. No frank epigastric pain. History of chronic constipation for which she takes Amitiza. Last bowel movement was Sunday, April 24 which she says is not unusual for her.    EKG performed and shows no change from EKG 2009.  Stat lab work including amylase lipase and liver functions drawn as well as CBC with differential obtained  Did yard work over the weekend and felt very sore on Monday    Review of Systems     Objective:   Physical Exam  Skin warm and dry. Nodes none. Chest clear to auscultation without rales or wheezing. Cardiac exam regular rate and rhythm normal S1 and S2 without murmurs or gallops. Abdomen: Bowel sounds are active. No hepatosplenomegaly masses or tenderness. She is tender in her left parasternal area.  Stat lab work took several hours to complete. Patient has normal amylase, lipase of 197, alkaline phosphatase 206, SGOT 480, SGPT 344. Patient says over the past month or so she's tried to get back to a more normal diet. She was watching her diet very carefully after her admission in mid January. She had low fat diet and did not drink alcohol. Recently has begun to drink one or 2  glasses of wine on Friday and Saturday nights. She did have one glass of wine on Saturday night April 23. CBC shows normal white blood cell count with eosinophils of 9%.  Lab work was reported to patient over the phone around 9:30 PM and discussed. She has appointment with Dr. Earlean Shawl tomorrow. Results faxed to Dr. Earlean Shawl.      Assessment & Plan:   Recurrent pancreatitis  Patient ate some rice this evening. Doesn't feel nauseated at present time and has no significant abdominal pain. Advised to stay well hydrated and to follow-up with Dr. Earlean Shawl in the morning. Eat very lightly with mainly liquids and avoid alcohol.

## 2014-09-16 NOTE — Patient Instructions (Signed)
Labs faxed to Dr. Earlean Shawl. Stay with clear liquids overnight and see Dr. Earlean Shawl in the morning. Call if symptoms worsen

## 2014-09-17 ENCOUNTER — Ambulatory Visit
Admission: RE | Admit: 2014-09-17 | Discharge: 2014-09-17 | Disposition: A | Discharge status: Home / Self Care | Payer: 59 | Source: Ambulatory Visit | Attending: Gastroenterology | Admitting: Gastroenterology

## 2014-09-17 ENCOUNTER — Other Ambulatory Visit: Payer: Self-pay | Admitting: Gastroenterology

## 2014-09-17 DIAGNOSIS — R945 Abnormal results of liver function studies: Principal | ICD-10-CM

## 2014-09-17 DIAGNOSIS — R7989 Other specified abnormal findings of blood chemistry: Secondary | ICD-10-CM

## 2014-09-18 ENCOUNTER — Other Ambulatory Visit: Payer: 59 | Admitting: Internal Medicine

## 2014-09-19 ENCOUNTER — Telehealth: Payer: Self-pay | Admitting: Internal Medicine

## 2014-09-19 ENCOUNTER — Encounter: Payer: 59 | Admitting: Internal Medicine

## 2014-09-19 NOTE — Telephone Encounter (Signed)
Pt called to inform that Dr. Earlean Shawl saw her on 09/17/14 and 09/19/14 and stated that she could have microscpoic stones or her sphincter may be too tight.  Dr. Earlean Shawl is referring her to [facility] for additional "pressure testing" on her sphincter.  Best number to call pt if needed is [phone]

## 2014-09-19 NOTE — Telephone Encounter (Signed)
Agree this is good suggestion. Will call pt.

## 2014-09-20 ENCOUNTER — Inpatient Hospital Stay (HOSPITAL_COMMUNITY): Payer: 59

## 2014-09-20 ENCOUNTER — Encounter (HOSPITAL_COMMUNITY): Payer: Self-pay | Admitting: Emergency Medicine

## 2014-09-20 ENCOUNTER — Inpatient Hospital Stay (HOSPITAL_COMMUNITY)
Admission: EM | Admit: 2014-09-20 | Discharge: 2014-09-21 | DRG: 439 | Disposition: A | Discharge status: Home / Self Care | Payer: 59 | Attending: Internal Medicine | Admitting: Internal Medicine

## 2014-09-20 DIAGNOSIS — K219 Gastro-esophageal reflux disease without esophagitis: Secondary | ICD-10-CM | POA: Diagnosis present

## 2014-09-20 DIAGNOSIS — K858 Other acute pancreatitis: Secondary | ICD-10-CM | POA: Diagnosis not present

## 2014-09-20 DIAGNOSIS — Z87891 Personal history of nicotine dependence: Secondary | ICD-10-CM

## 2014-09-20 DIAGNOSIS — R740 Nonspecific elevation of levels of transaminase and lactic acid dehydrogenase [LDH]: Secondary | ICD-10-CM | POA: Diagnosis present

## 2014-09-20 DIAGNOSIS — Z96641 Presence of right artificial hip joint: Secondary | ICD-10-CM | POA: Diagnosis present

## 2014-09-20 DIAGNOSIS — R7401 Elevation of levels of liver transaminase levels: Secondary | ICD-10-CM | POA: Diagnosis present

## 2014-09-20 DIAGNOSIS — K859 Acute pancreatitis without necrosis or infection, unspecified: Secondary | ICD-10-CM

## 2014-09-20 DIAGNOSIS — Z823 Family history of stroke: Secondary | ICD-10-CM | POA: Diagnosis not present

## 2014-09-20 DIAGNOSIS — R509 Fever, unspecified: Secondary | ICD-10-CM

## 2014-09-20 DIAGNOSIS — E039 Hypothyroidism, unspecified: Secondary | ICD-10-CM | POA: Diagnosis present

## 2014-09-20 DIAGNOSIS — R6510 Systemic inflammatory response syndrome (SIRS) of non-infectious origin without acute organ dysfunction: Secondary | ICD-10-CM | POA: Diagnosis present

## 2014-09-20 DIAGNOSIS — R748 Abnormal levels of other serum enzymes: Secondary | ICD-10-CM | POA: Diagnosis present

## 2014-09-20 DIAGNOSIS — K861 Other chronic pancreatitis: Secondary | ICD-10-CM | POA: Diagnosis present

## 2014-09-20 DIAGNOSIS — Z8249 Family history of ischemic heart disease and other diseases of the circulatory system: Secondary | ICD-10-CM | POA: Diagnosis not present

## 2014-09-20 DIAGNOSIS — M1990 Unspecified osteoarthritis, unspecified site: Secondary | ICD-10-CM | POA: Diagnosis present

## 2014-09-20 DIAGNOSIS — Z801 Family history of malignant neoplasm of trachea, bronchus and lung: Secondary | ICD-10-CM

## 2014-09-20 DIAGNOSIS — Z791 Long term (current) use of non-steroidal anti-inflammatories (NSAID): Secondary | ICD-10-CM | POA: Diagnosis not present

## 2014-09-20 DIAGNOSIS — Z79899 Other long term (current) drug therapy: Secondary | ICD-10-CM | POA: Diagnosis not present

## 2014-09-20 DIAGNOSIS — R1012 Left upper quadrant pain: Secondary | ICD-10-CM | POA: Diagnosis not present

## 2014-09-20 LAB — CBC
HCT: 39.2 % (ref 36.0–46.0)
Hemoglobin: 12.9 g/dL (ref 12.0–15.0)
MCH: 28.9 pg (ref 26.0–34.0)
MCHC: 32.9 g/dL (ref 30.0–36.0)
MCV: 87.7 fL (ref 78.0–100.0)
PLATELETS: 328 10*3/uL (ref 150–400)
RBC: 4.47 MIL/uL (ref 3.87–5.11)
RDW: 13.3 % (ref 11.5–15.5)
WBC: 10.2 10*3/uL (ref 4.0–10.5)

## 2014-09-20 LAB — CBC WITH DIFFERENTIAL/PLATELET
BASOS ABS: 0 10*3/uL (ref 0.0–0.1)
BASOS PCT: 0 % (ref 0–1)
EOS ABS: 1 10*3/uL — AB (ref 0.0–0.7)
Eosinophils Relative: 10 % — ABNORMAL HIGH (ref 0–5)
HCT: 43.2 % (ref 36.0–46.0)
Hemoglobin: 14.3 g/dL (ref 12.0–15.0)
LYMPHS ABS: 0.6 10*3/uL — AB (ref 0.7–4.0)
LYMPHS PCT: 6 % — AB (ref 12–46)
MCH: 29.1 pg (ref 26.0–34.0)
MCHC: 33.1 g/dL (ref 30.0–36.0)
MCV: 88 fL (ref 78.0–100.0)
Monocytes Absolute: 0.7 10*3/uL (ref 0.1–1.0)
Monocytes Relative: 6 % (ref 3–12)
NEUTROS ABS: 8.5 10*3/uL — AB (ref 1.7–7.7)
NEUTROS PCT: 78 % — AB (ref 43–77)
Platelets: 351 10*3/uL (ref 150–400)
RBC: 4.91 MIL/uL (ref 3.87–5.11)
RDW: 13.2 % (ref 11.5–15.5)
WBC: 10.8 10*3/uL — AB (ref 4.0–10.5)

## 2014-09-20 LAB — URINALYSIS, ROUTINE W REFLEX MICROSCOPIC
Bilirubin Urine: NEGATIVE
Bilirubin Urine: NEGATIVE
GLUCOSE, UA: NEGATIVE mg/dL
Glucose, UA: NEGATIVE mg/dL
Hgb urine dipstick: NEGATIVE
Hgb urine dipstick: NEGATIVE
KETONES UR: NEGATIVE mg/dL
Ketones, ur: NEGATIVE mg/dL
Leukocytes, UA: NEGATIVE
Leukocytes, UA: NEGATIVE
NITRITE: NEGATIVE
Nitrite: NEGATIVE
PROTEIN: NEGATIVE mg/dL
Protein, ur: NEGATIVE mg/dL
SPECIFIC GRAVITY, URINE: 1.011 (ref 1.005–1.030)
Specific Gravity, Urine: 1.014 (ref 1.005–1.030)
Urobilinogen, UA: 1 mg/dL (ref 0.0–1.0)
Urobilinogen, UA: 1 mg/dL (ref 0.0–1.0)
pH: 6.5 (ref 5.0–8.0)
pH: 6 (ref 5.0–8.0)

## 2014-09-20 LAB — COMPREHENSIVE METABOLIC PANEL
ALT: 268 U/L — ABNORMAL HIGH (ref 0–35)
AST: 179 U/L — AB (ref 0–37)
Albumin: 3.8 g/dL (ref 3.5–5.2)
Alkaline Phosphatase: 476 U/L — ABNORMAL HIGH (ref 39–117)
Anion gap: 8 (ref 5–15)
BILIRUBIN TOTAL: 1.1 mg/dL (ref 0.3–1.2)
BUN: 12 mg/dL (ref 6–23)
CALCIUM: 9.2 mg/dL (ref 8.4–10.5)
CO2: 24 mmol/L (ref 19–32)
Chloride: 107 mmol/L (ref 96–112)
Creatinine, Ser: 0.79 mg/dL (ref 0.50–1.10)
GFR, EST NON AFRICAN AMERICAN: 84 mL/min — AB (ref >90)
GLUCOSE: 112 mg/dL — AB (ref 70–99)
POTASSIUM: 3.8 mmol/L (ref 3.5–5.1)
SODIUM: 139 mmol/L (ref 135–145)
Total Protein: 7.2 g/dL (ref 6.0–8.3)

## 2014-09-20 LAB — TROPONIN I: Troponin I: <0.03 ng/mL (ref <0.031)

## 2014-09-20 LAB — LACTIC ACID, PLASMA: LACTIC ACID, VENOUS: 0.8 mmol/L (ref 0.5–2.0)

## 2014-09-20 LAB — CREATININE, SERUM
Creatinine, Ser: 0.85 mg/dL (ref 0.50–1.10)
GFR calc non Af Amer: 69 mL/min — ABNORMAL LOW (ref >90)
GFR, EST AFRICAN AMERICAN: 80 mL/min — AB (ref >90)

## 2014-09-20 LAB — TSH: TSH: 1.366 u[IU]/mL (ref 0.350–4.500)

## 2014-09-20 LAB — LIPASE, BLOOD: Lipase: 935 U/L — ABNORMAL HIGH (ref 11–59)

## 2014-09-20 MED ORDER — SODIUM CHLORIDE 0.9 % IV BOLUS (SEPSIS)
1000.0000 mL | Freq: Once | INTRAVENOUS | Status: AC
  Administered 2014-09-20: 1000 mL via INTRAVENOUS

## 2014-09-20 MED ORDER — ENOXAPARIN SODIUM 40 MG/0.4ML ~~LOC~~ SOLN
40.0000 mg | SUBCUTANEOUS | Status: DC
  Administered 2014-09-20: 40 mg via SUBCUTANEOUS
  Filled 2014-09-20 (×2): qty 0.4

## 2014-09-20 MED ORDER — CHLORHEXIDINE GLUCONATE 0.12 % MT SOLN
15.0000 mL | Freq: Two times a day (BID) | OROMUCOSAL | Status: DC
  Administered 2014-09-20 – 2014-09-21 (×2): 15 mL via OROMUCOSAL
  Filled 2014-09-20 (×5): qty 15

## 2014-09-20 MED ORDER — ONDANSETRON HCL 4 MG PO TABS: 4.0000 mg | ORAL_TABLET | Freq: Four times a day (QID) | ORAL | Status: DC | PRN

## 2014-09-20 MED ORDER — ALBUTEROL SULFATE (2.5 MG/3ML) 0.083% IN NEBU: 2.5000 mg | INHALATION_SOLUTION | Freq: Four times a day (QID) | RESPIRATORY_TRACT | Status: DC | PRN

## 2014-09-20 MED ORDER — CETYLPYRIDINIUM CHLORIDE 0.05 % MT LIQD
7.0000 mL | Freq: Two times a day (BID) | OROMUCOSAL | Status: DC
  Administered 2014-09-20: 7 mL via OROMUCOSAL

## 2014-09-20 MED ORDER — HYDROMORPHONE HCL 1 MG/ML IJ SOLN: 0.5000 mg | Freq: Once | INTRAMUSCULAR | Status: DC

## 2014-09-20 MED ORDER — MORPHINE SULFATE 2 MG/ML IJ SOLN
1.0000 mg | INTRAMUSCULAR | Status: DC | PRN
  Administered 2014-09-20 (×3): 1 mg via INTRAVENOUS
  Filled 2014-09-20 (×3): qty 1

## 2014-09-20 MED ORDER — ONDANSETRON HCL 4 MG/2ML IJ SOLN
4.0000 mg | Freq: Once | INTRAMUSCULAR | Status: AC
  Administered 2014-09-20: 4 mg via INTRAVENOUS
  Filled 2014-09-20: qty 2

## 2014-09-20 MED ORDER — IOHEXOL 300 MG/ML  SOLN
100.0000 mL | Freq: Once | INTRAMUSCULAR | Status: AC | PRN
  Administered 2014-09-20: 100 mL via INTRAVENOUS

## 2014-09-20 MED ORDER — LEVOTHYROXINE SODIUM 50 MCG PO TABS
50.0000 ug | ORAL_TABLET | Freq: Every day | ORAL | Status: DC
  Administered 2014-09-21: 50 ug via ORAL
  Filled 2014-09-20 (×2): qty 1

## 2014-09-20 MED ORDER — MORPHINE SULFATE 4 MG/ML IJ SOLN: 4.0000 mg | Freq: Once | INTRAMUSCULAR | Status: DC

## 2014-09-20 MED ORDER — ONDANSETRON HCL 4 MG/2ML IJ SOLN: 4.0000 mg | Freq: Four times a day (QID) | INTRAMUSCULAR | Status: DC | PRN

## 2014-09-20 MED ORDER — SODIUM CHLORIDE 0.9 % IV SOLN
INTRAVENOUS | Status: DC
Start: 2014-09-20 — End: 2014-09-21
  Administered 2014-09-20: 13:00:00 via INTRAVENOUS

## 2014-09-20 MED ORDER — FENTANYL CITRATE (PF) 100 MCG/2ML IJ SOLN
100.0000 ug | Freq: Once | INTRAMUSCULAR | Status: AC
  Administered 2014-09-20: 100 ug via INTRAVENOUS
  Filled 2014-09-20: qty 2

## 2014-09-20 MED ORDER — IOHEXOL 300 MG/ML  SOLN
50.0000 mL | Freq: Once | INTRAMUSCULAR | Status: AC | PRN
  Administered 2014-09-20: 50 mL via ORAL

## 2014-09-20 MED ORDER — ALBUTEROL SULFATE HFA 108 (90 BASE) MCG/ACT IN AERS: 2.0000 | INHALATION_SPRAY | Freq: Four times a day (QID) | RESPIRATORY_TRACT | Status: DC | PRN

## 2014-09-20 NOTE — H&P (Addendum)
[facility] History and Physical  Ciearra Rufo Hupp URK:[id number] DOB: [birth year] DOA: 09/20/2014  Referring physician: Dr. 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 Patient now presented to [facility] emergency department with main concern of several days duration of progressively worsening upper quadrant abdominal pain, left side worse than the right side, intermittent in nature and sharp, 10/10 when present, occasionally but not consistently radiating to the back area and entire abdominal area, worse with eating, no specific alleviating factors, associated with nausea, poor oral intake, intermittent episodes of nonbloody vomiting. Patient denies fevers and chills, no chest pain or shortness of breath, no specific urinary concerns. Patient also denies any recent changes in medications.  In emergency department patient noted to be hemodynamically stable, vital signs notable for T1 100.3 F, HR 99, blood pressure and respiratory rate stable. WBC 10.8. [facility] asked to admit for further evaluation. Medical bed requested  Assessment and Plan: Active Problems: Principal Problem:   Pancreatitis, acute on chronic - With significantly elevated lipase levels, greater than 900 - Admit to medical unit, patient's gastroenterologist Dr. Thana Farr consulted, awaiting call back - Keep nothing by mouth for now, provide IV fluids, analgesia and antiemetics as needed - Repeat lipase level in the morning - CT abd and pelvis pending    SIRS - Criteria met on admission with T1 100.3 F, HR 99, mildly elevated WBC 10.8 - Chest x-ray and  urinalysis requested - It is however possible this is secondary to acute problem underlined above - will hold off on ABX for now as no clear source known Active Problems:   Transaminitis - Likely secondary to principal problem - Repeat CMP in the morning, follow up on GI recommendations   Hypothyroidism - Check TSH, continue Synthroid   DVT prophylaxis - Lovenox SQ  Radiological Exams on Admission: No results found.   Code Status: Full Family Communication: Pt at bedside Disposition Plan: Admit for further evaluation    [facility] [phone]   Review of Systems:  Constitutional: Negative for diaphoresis.  HENT: Negative for hearing loss, ear pain, nosebleeds, congestion, sore throat, neck pain, tinnitus and ear discharge.   Eyes: Negative for blurred vision, double vision, photophobia, pain, discharge and redness.  Respiratory: Negative for cough, hemoptysis, sputum production, shortness of breath, wheezing and stridor.   Cardiovascular: Negative for chest pain, palpitations, orthopnea, claudication and leg swelling.  Gastrointestinal: Negative for heartburn, constipation, blood in stool and melena.  Genitourinary: Negative for dysuria, urgency, frequency, hematuria and flank pain.  Musculoskeletal: Negative for myalgias, back pain, joint pain and falls.  Skin: Negative for itching and rash.  Neurological: Negative for dizziness and weakness.  Endo/Heme/Allergies: Negative for environmental allergies and polydipsia. Does not bruise/bleed easily.  Psychiatric/Behavioral: Negative for suicidal ideas. The patient is not nervous/anxious.      Past Medical History  Diagnosis Date  . Schatzki's ring   . GERD (gastroesophageal reflux disease)   . Arthritis   . Allergy   . Colon polyps   . Constipation   . Spinal stenosis   . Seasonal affective disorder   . IBS (irritable bowel syndrome)   . Thyroid disease  Hypo  . Osteopenia 2007    T score -1.4  . Asthma      exercise induced    Past Surgical History  Procedure Laterality Date  . Hip surgery Right 2001    Replacement  . Foot surgery Left   . Joint replacement    . Eus N/A 06/12/2014    Procedure: UPPER ENDOSCOPIC ULTRASOUND (EUS) LINEAR;  Surgeon: Beryle Beams, MD;  Location: [facility];  Service: Endoscopy;  Laterality: N/A;    Social History:  reports that she quit smoking about 40 years ago. She has never used smokeless tobacco. She reports that she drinks about 4.2 oz of alcohol per week. She reports that she does not use illicit drugs.  No Known Allergies  Family History  Problem Relation Age of Onset  . Stroke Mother   . Osteoporosis Mother   . Heart disease Father   . Hyperlipidemia Father   . Lung cancer Father     Prior to Admission medications   Medication Sig Start Date End Date Taking? Authorizing Provider  cholecalciferol (VITAMIN D) 1000 UNITS tablet Take 1,000 Units by mouth daily.   Yes Historical Provider, MD  Digestive Enzymes (ENZYME DIGEST) CAPS Take 1 capsule by mouth daily.    Yes Historical Provider, MD  EVENING PRIMROSE OIL PO Take 1 capsule by mouth daily.   Yes Historical Provider, MD  LINZESS 290 MCG CAPS capsule Take 290 mcg by mouth daily.  03/19/14  Yes Historical Provider, MD  Multiple Vitamin (MULTIVITAMIN) tablet Take 1 tablet by mouth daily.     Yes Historical Provider, MD  naproxen sodium (ANAPROX) 220 MG tablet Take 220 mg by mouth every 8 (eight) hours as needed (for pain and fever).   Yes Historical Provider, MD  SYNTHROID 50 MCG tablet Take 1 tablet by mouth  daily 01/02/14  Yes Elby Showers, MD  VASCEPA 1 G CAPS Take 2 capsules by mouth  twice daily at noon and 4PM 12/30/13  Yes Elby Showers, MD  VENTOLIN HFA 108 (90 BASE) MCG/ACT inhaler Inhale 2 puffs into the lungs every 6 (six) hours as needed for wheezing or shortness of breath.  02/16/12  Yes Historical Provider, MD  celecoxib (CELEBREX) 200 MG capsule Take 1 capsule (200 mg total) by  mouth daily. Patient not taking: Reported on 09/16/2014 03/14/13   Elby Showers, MD  hydrOXYzine (ATARAX/VISTARIL) 25 MG tablet 1-2 tabs po TID as needed for itching Patient not taking: Reported on 09/16/2014 06/19/14   Elby Showers, MD  ondansetron (ZOFRAN ODT) 8 MG disintegrating tablet Take 1 tablet (8 mg total) by mouth every 8 (eight) hours as needed for nausea or vomiting. Patient not taking: Reported on 09/16/2014 05/26/14   Jola Schmidt, MD  triamcinolone cream (KENALOG) 0.1 % Apply 1 application topically 3 (three) times daily. Patient not taking: Reported on 09/16/2014 06/19/14   Elby Showers, MD    Physical Exam: Filed Vitals:   09/20/14 0748 09/20/14 1003  BP: 121/70 127/77  Pulse: 92 99  Temp: 97.9 F (36.6 C)   TempSrc: Oral   Resp: 16 16  SpO2: 100% 97%    Physical Exam  Constitutional: Appears well-developed and well-nourished. No distress.  HENT: Normocephalic. External right and left ear normal. Dry MM  Eyes: Conjunctivae and EOM are normal. PERRLA, no scleral icterus.  Neck: Normal ROM. Neck supple. No JVD. No tracheal deviation. No thyromegaly.  CVS: RRR, S1/S2 +, no murmurs, no gallops, no carotid  bruit.  Pulmonary: Effort and breath sounds normal, no stridor, rhonchi, wheezes, rales.  Abdominal: Soft. BS +,  no distension, tenderness in upper abd quadrants, no rebound or guarding.  Musculoskeletal: Normal range of motion. No edema and no tenderness.  Lymphadenopathy: No lymphadenopathy noted, cervical, inguinal. Neuro: Alert. Normal reflexes, muscle tone coordination. No cranial nerve deficit. Skin: Skin is warm and dry. No rash noted. Not diaphoretic. No erythema. No pallor.  Psychiatric: Normal mood and affect. Behavior, judgment, thought content normal.   Labs on Admission:  Basic Metabolic Panel:  Recent Labs Lab 09/16/14 1405 09/20/14 0806  NA 134* 139  K 4.4 3.8  CL 99 107  CO2 22 24  GLUCOSE 99 112*  BUN 17 12  CREATININE 0.89 0.79  CALCIUM  9.2 9.2   Liver Function Tests:  Recent Labs Lab 09/16/14 1405 09/20/14 0806  AST 480* 179*  ALT 344* 268*  ALKPHOS 206* 476*  BILITOT 0.7 1.1  PROT 6.1 7.2  ALBUMIN 4.0 3.8    Recent Labs Lab 09/16/14 1405 09/20/14 0806  LIPASE 197* 935*  AMYLASE 63  --    CBC:  Recent Labs Lab 09/16/14 1405 09/20/14 0806  WBC 7.2 10.8*  NEUTROABS 5.3 8.5*  HGB 13.1 14.3  HCT 37.2 43.2  MCV 83.2 88.0  PLT 263 351   Cardiac Enzymes:  Recent Labs Lab 09/20/14 0806  TROPONINI <0.03    EKG: pending    If 7PM-7AM, please contact night-coverage [email or web address] Password [email or web address] 09/20/2014, 11:18 AM

## 2014-09-20 NOTE — ED Provider Notes (Signed)
CSN: [id number]     Arrival date & time 09/20/14  0740 History   First MD Initiated Contact with Patient 09/20/14 [phone]     Chief Complaint  Patient presents with  . Abdominal Pain     (Consider location/radiation/quality/duration/timing/severity/associated sxs/prior Treatment) HPI Comments: 69 year old female, history of pancreatitis, was admitted to the hospital in January 2016 with new onset pancreatitis, had multiple studies including abdominal ultrasound, HIDA scan, endoscopic ultrasound none of which revealed a definitive etiology and sphincter of road I dysfunction is thought to be the source of the patient's symptoms. She has had persistent elevation in her liver function tests with a normal bilirubin, lipase has been between 104 100, she has been feeling well for the last several months that she has been at home until this week when on Monday she developed recurrent pain after eating lunch, it went away but came back the next morning with breakfast and has been present ever since. She has had very little to eat or drink since that time, feels very dehydrated, has persistent nausea and abdominal pain described as a pain in the epigastrium and left upper quadrant which radiates to her back. She has had no changes in her stools, she has normal constipation, no blood in her stools, no dysuria, no swelling, no rashes, no shortness of breath, no fevers. She has seen her family doctor this week who performed an EKG and a chest x-ray reported as normal, she followed up with gastroenterology and was seen in the office, she was unable to follow-up later in the week and presents today with increasing pain. She does not have pain medications at home to help with this pain.  Patient is a 69 y.o. female presenting with abdominal pain. The history is provided by the patient and medical records.  Abdominal Pain   Past Medical History  Diagnosis Date  . Schatzki's ring   . GERD (gastroesophageal reflux  disease)   . Arthritis   . Allergy   . Colon polyps   . Constipation   . Spinal stenosis   . Seasonal affective disorder   . IBS (irritable bowel syndrome)   . Thyroid disease     Hypo  . Osteopenia 2007    T score -1.4  . Asthma     exercise induced   Past Surgical History  Procedure Laterality Date  . Hip surgery Right 2001    Replacement  . Foot surgery Left   . Joint replacement    . Eus N/A 06/12/2014    Procedure: UPPER ENDOSCOPIC ULTRASOUND (EUS) LINEAR;  Surgeon: Beryle Beams, MD;  Location: [facility];  Service: Endoscopy;  Laterality: N/A;   Family History  Problem Relation Age of Onset  . Stroke Mother   . Osteoporosis Mother   . Heart disease Father   . Hyperlipidemia Father   . Lung cancer Father    History  Substance Use Topics  . Smoking status: Former Smoker    Quit date: 05/23/1974  . Smokeless tobacco: Never Used  . Alcohol Use: 4.2 oz/week    7 Glasses of wine per week     Comment: 3-4 times a week   OB History    Gravida Para Term Preterm AB TAB SAB Ectopic Multiple Living   1    1     0     Review of Systems  Gastrointestinal: Positive for abdominal pain.  All other systems reviewed and are negative.     Allergies  Review  of patient's allergies indicates no known allergies.  Home Medications   Prior to Admission medications   Medication Sig Start Date End Date Taking? Authorizing Provider  cholecalciferol (VITAMIN D) 1000 UNITS tablet Take 1,000 Units by mouth daily.   Yes Historical Provider, MD  Digestive Enzymes (ENZYME DIGEST) CAPS Take 1 capsule by mouth daily.    Yes Historical Provider, MD  EVENING PRIMROSE OIL PO Take 1 capsule by mouth daily.   Yes Historical Provider, MD  LINZESS 290 MCG CAPS capsule Take 290 mcg by mouth daily.  03/19/14  Yes Historical Provider, MD  Multiple Vitamin (MULTIVITAMIN) tablet Take 1 tablet by mouth daily.     Yes Historical Provider, MD  naproxen sodium (ANAPROX) 220 MG tablet Take 220 mg  by mouth every 8 (eight) hours as needed (for pain and fever).   Yes Historical Provider, MD  SYNTHROID 50 MCG tablet Take 1 tablet by mouth  daily 01/02/14  Yes Elby Showers, MD  VASCEPA 1 G CAPS Take 2 capsules by mouth  twice daily at noon and 4PM 12/30/13  Yes Elby Showers, MD  VENTOLIN HFA 108 (90 BASE) MCG/ACT inhaler Inhale 2 puffs into the lungs every 6 (six) hours as needed for wheezing or shortness of breath.  02/16/12  Yes Historical Provider, MD  celecoxib (CELEBREX) 200 MG capsule Take 1 capsule (200 mg total) by mouth daily. Patient not taking: Reported on 09/16/2014 03/14/13   Elby Showers, MD  hydrOXYzine (ATARAX/VISTARIL) 25 MG tablet 1-2 tabs po TID as needed for itching Patient not taking: Reported on 09/16/2014 06/19/14   Elby Showers, MD  ondansetron (ZOFRAN ODT) 8 MG disintegrating tablet Take 1 tablet (8 mg total) by mouth every 8 (eight) hours as needed for nausea or vomiting. Patient not taking: Reported on 09/16/2014 05/26/14   Jola Schmidt, MD  triamcinolone cream (KENALOG) 0.1 % Apply 1 application topically 3 (three) times daily. Patient not taking: Reported on 09/16/2014 06/19/14   Elby Showers, MD   BP 127/77 mmHg  Pulse 99  Temp(Src) 97.9 F (36.6 C) (Oral)  Resp 16  SpO2 97% Physical Exam  Constitutional: She appears well-developed and well-nourished. No distress.  HENT:  Head: Normocephalic and atraumatic.  Mouth/Throat: Oropharynx is clear and moist. No oropharyngeal exudate.  Eyes: Conjunctivae and EOM are normal. Pupils are equal, round, and reactive to light. Right eye exhibits no discharge. Left eye exhibits no discharge. No scleral icterus.  Neck: Normal range of motion. Neck supple. No JVD present. No thyromegaly present.  Cardiovascular: Normal rate, regular rhythm, normal heart sounds and intact distal pulses.  Exam reveals no gallop and no friction rub.   No murmur heard. Pulmonary/Chest: Effort normal and breath sounds normal. No respiratory  distress. She has no wheezes. She has no rales.  Abdominal: Soft. Bowel sounds are normal. She exhibits no distension and no mass. There is tenderness ( ttp in the epigastrium and LUQ, moderate without peritoneal signs - no pain in the lower abd to palpation.).  No CVA ttp  Musculoskeletal: Normal range of motion. She exhibits no edema or tenderness.  Lymphadenopathy:    She has no cervical adenopathy.  Neurological: She is alert. Coordination normal.  Skin: Skin is warm and dry. No rash noted. No erythema.  Psychiatric: She has a normal mood and affect. Her behavior is normal.  Nursing note and vitals reviewed.   ED Course  Procedures (including critical care time) Labs Review Labs Reviewed  COMPREHENSIVE METABOLIC  PANEL - Abnormal; Notable for the following:    Glucose, Bld 112 (*)    AST 179 (*)    ALT 268 (*)    Alkaline Phosphatase 476 (*)    GFR calc non Af Amer 84 (*)    All other components within normal limits  CBC WITH DIFFERENTIAL/PLATELET - Abnormal; Notable for the following:    WBC 10.8 (*)    Neutrophils Relative % 78 (*)    Neutro Abs 8.5 (*)    Lymphocytes Relative 6 (*)    Lymphs Abs 0.6 (*)    Eosinophils Relative 10 (*)    Eosinophils Absolute 1.0 (*)    All other components within normal limits  LIPASE, BLOOD - Abnormal; Notable for the following:    Lipase 935 (*)    All other components within normal limits  TROPONIN I  URINALYSIS, ROUTINE W REFLEX MICROSCOPIC    Imaging Review No results found.    MDM   Final diagnoses:  Acute pancreatitis, unspecified pancreatitis type    The patient is afebrile with normal vital signs, she likely has recurrent pancreatitis, she is not jaundiced at all, the etiology of her symptoms is unclear but anticipate need for gastroenterology intervention with possible procedure to further evaluate her sphincter of what I, will avoid morphine for this reason.  LFT's in the 1-200's, has lipase of 935, significantly  elevated c/w prior - d/w Dr. Doyle Askew who will admit.  Meds given in ED:  Medications  HYDROmorphone (DILAUDID) injection 0.5 mg (not administered)  sodium chloride 0.9 % bolus 1,000 mL (0 mLs Intravenous Stopped 09/20/14 1024)  ondansetron (ZOFRAN) injection 4 mg (4 mg Intravenous Given 09/20/14 0810)  fentaNYL (SUBLIMAZE) injection 100 mcg (100 mcg Intravenous Given 09/20/14 0810)    New Prescriptions   No medications on file        Noemi Chapel, MD 09/20/14 1120

## 2014-09-20 NOTE — ED Notes (Signed)
Per pt, states she is seeing GI for gallbladder issues-incereased pain last ight

## 2014-09-20 NOTE — ED Notes (Signed)
Patient was informed that a urine sample is needed but is unable to go. Fluids are currently running through IV will notify when she can. Sponge on stick and water given.

## 2014-09-21 DIAGNOSIS — K858 Other acute pancreatitis: Secondary | ICD-10-CM

## 2014-09-21 LAB — COMPREHENSIVE METABOLIC PANEL
ALBUMIN: 2.6 g/dL — AB (ref 3.5–5.0)
ALT: 149 U/L — ABNORMAL HIGH (ref 14–54)
AST: 53 U/L — AB (ref 15–41)
Alkaline Phosphatase: 336 U/L — ABNORMAL HIGH (ref 38–126)
Anion gap: 8 (ref 5–15)
BUN: 10 mg/dL (ref 6–20)
CALCIUM: 8.5 mg/dL — AB (ref 8.9–10.3)
CO2: 22 mmol/L (ref 22–32)
Chloride: 111 mmol/L (ref 101–111)
Creatinine, Ser: 0.77 mg/dL (ref 0.44–1.00)
GFR calc Af Amer: >60 mL/min (ref >60)
GFR calc non Af Amer: >60 mL/min (ref >60)
Glucose, Bld: 81 mg/dL (ref 70–99)
Potassium: 3.8 mmol/L (ref 3.5–5.1)
Sodium: 141 mmol/L (ref 135–145)
Total Bilirubin: 0.9 mg/dL (ref 0.3–1.2)
Total Protein: 5.4 g/dL — ABNORMAL LOW (ref 6.5–8.1)

## 2014-09-21 LAB — URINE CULTURE

## 2014-09-21 LAB — CBC
HCT: 35.9 % — ABNORMAL LOW (ref 36.0–46.0)
HEMOGLOBIN: 12 g/dL (ref 12.0–15.0)
MCH: 29.5 pg (ref 26.0–34.0)
MCHC: 33.4 g/dL (ref 30.0–36.0)
MCV: 88.2 fL (ref 78.0–100.0)
Platelets: 318 10*3/uL (ref 150–400)
RBC: 4.07 MIL/uL (ref 3.87–5.11)
RDW: 13.3 % (ref 11.5–15.5)
WBC: 7.9 10*3/uL (ref 4.0–10.5)

## 2014-09-21 LAB — LIPASE, BLOOD: Lipase: 507 U/L — ABNORMAL HIGH (ref 22–51)

## 2014-09-21 NOTE — Discharge Instructions (Signed)

## 2014-09-21 NOTE — Discharge Summary (Addendum)
Physician Discharge Summary  Chelsea Cantrell HUD:[id number] DOB: 04/30/1946 DOA: 09/20/2014  PCP: Elby Showers, MD  Admit date: 09/20/2014 Discharge date: 09/21/2014  Recommendations for Outpatient Follow-up:  1. Pt will need to follow up with PCP in 2-3 weeks post discharge 2. Please obtain CMP to evaluate electrolytes and LFT's 3. Pt to see Dr. Thana Farr 09/22/2014 in AM for further assistance, per Dr. Renold Genta, Dr. Morene Crocker arranging follow up [facility] 4. 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 Patient now presented to [facility] emergency department with main concern of several days duration of progressively worsening upper quadrant abdominal pain, left side worse than the right side, intermittent in nature and sharp, 10/10 when present, occasionally but not consistently radiating to the back area and entire abdominal area, worse with eating, no specific alleviating factors, associated with nausea, poor oral intake, intermittent episodes of nonbloody vomiting. Patient denies fevers and chills, no chest pain or shortness of breath, no specific urinary concerns. Patient also denies any recent changes in medications.  In emergency department patient noted to be hemodynamically stable, vital signs notable for T1 100.3 F, HR 99, blood pressure and respiratory rate stable. WBC 10.8. [facility] asked to admit for further  evaluation. Medical bed requested  Assessment and Plan: Active Problems: Principal Problem:  Pancreatitis, acute on chronic - With significantly elevated lipase levels, greater than 900, now trending down  - pt wants to go home today with follow up with Dr. Thana Farr in AM (she says she is scheduled to see him 5/2) - diet advanced to soft and pt tolerating well   SIRS - Criteria met on admission with T1 100.3 F, HR 99, mildly elevated WBC 10.8 - It is however possible this is secondary to acute problem underlined above - CXR and CT abd with no acute infectious etiology, UA unremarkable - no need for ABX at this time  Active Problems:  Transaminitis - Likely secondary to principal problem - LFT's trending down   Hypothyroidism - continue Synthroid per home medical regimen    Procedures/Studies: Dg Chest 2 View  09/20/2014   CLINICAL DATA:  69 year old female with a history of upper abdominal pain  EXAM: CHEST - 2 VIEW  COMPARISON:  None.  FINDINGS: Cardiomediastinal silhouette projects within normal limits in size and contour. No confluent airspace disease, pneumothorax, or pleural effusion.  No displaced fracture.  Unremarkable appearance of the upper abdomen.  IMPRESSION: No radiographic evidence of acute cardiopulmonary disease.  Signed,  Dulcy Fanny. Earleen Newport, DO  [facility]  [facility]   Electronically Signed   By: Corrie Mckusick D.O.   On: 09/20/2014 15:07   US Abdomen Complete  09/17/2014   CLINICAL DATA:  Elevated LFTs, mid chest pain after eating, hepatic steatosis, history of pancreatitis  EXAM: ULTRASOUND ABDOMEN COMPLETE  COMPARISON:  CT abdomen pelvis dated 06/09/2014  FINDINGS: Gallbladder: No gallstones, gallbladder wall thickening, or pericholecystic fluid. Negative sonographic Murphy's sign.  Common bile duct: Diameter:  7 mm  Liver: No focal lesion identified. Within normal limits in parenchymal echogenicity.  IVC: No abnormality  visualized.  Pancreas: Poorly visualized due to overlying bowel gas.  Spleen: Size and appearance within normal limits.  Right Kidney: Length: 10.1 cm.  No mass or hydronephrosis.  Left Kidney: Length: 10.1 cm.  No mass or hydronephrosis.  Abdominal aorta: No aneurysm visualized.  Other findings: None.  IMPRESSION: Negative abdominal ultrasound.   Electronically Signed   By: Julian Hy M.D.   On: 09/17/2014 14:04   Ct Abdomen Pelvis W Contrast  09/20/2014   CLINICAL DATA:  Elevated lipase. Left upper quadrant pain for several days. History of pancreatitis. White count 10.8.  EXAM: CT ABDOMEN AND PELVIS WITH CONTRAST  TECHNIQUE: Multidetector CT imaging of the abdomen and pelvis was performed using the standard protocol following bolus administration of intravenous contrast.  CONTRAST:  19m OMNIPAQUE IOHEXOL 300 MG/ML SOLN, 1039mOMNIPAQUE IOHEXOL 300 MG/ML SOLN  COMPARISON:  06/09/2014  FINDINGS: Lower chest: Mild dependent changes in the lung bases. Heart is normal size. No pericardial effusion or significant coronary artery calcifications.  Upper abdomen: There is mild periportal edema. No focal abnormality identified within the liver, spleen, pancreas, or adrenal glands. There is no peripancreatic edema. No pseudocyst or abscess. Gallbladder is present. The kidneys are normal in appearance.  Gastrointestinal tract: The stomach and small bowel loops are normal in appearance. Colonic loops are markedly distended with large amounts of stool. Colonic wall is normal in appearance. The appendix is well seen and has a normal appearance. Up  Pelvis: There is significant streak artifact from right hip arthroplasty. The uterus is present. Hernia bladder is distended. There is no free pelvic fluid. No adnexal mass.  Retroperitoneum: No retroperitoneal or mesenteric adenopathy. No evidence for aortic aneurysm.  Abdominal wall: Unremarkable.  Osseous structures: Degenerative changes are seen in the lower lumbar  spine. No suspicious lytic or blastic lesions are identified. Status post right hip arthroplasty.  IMPRESSION: 1. Normal appearance of the pancreas. Although this does not exclude pancreatitis, no complications of pancreatitis are identified. 2. Large stool burden may account for the patient'  s symptoms. 3. Normal appendix.   Electronically Signed   By: ElNolon Nations.D.   On: 09/20/2014 16:13   Dg Abd 2 Views  09/16/2014   CLINICAL DATA:  Mid epigastric pain for 2 days.  EXAM: ABDOMEN - 2 VIEW  COMPARISON:  CT abdomen and pelvis 06/09/2014.  FINDINGS: The bowel gas pattern is nonobstructive. There is a very large volume of stool throughout the colon. No unexpected abdominal calcification is seen. Lumbar spondylosis is noted. Resurfacing right hip arthroplasty is seen.  IMPRESSION: No acute abnormality.  Large volume of stool throughout the colon consistent with constipation.  Lumbar spondylosis.   Electronically Signed   By: ThInge Rise.D.   On: 09/16/2014 15:27    Discharge Exam: Filed Vitals:   09/21/14 0555  BP: 97/52  Pulse: 69  Temp: 98.2 F (36.8 C)  Resp: 18   Filed Vitals:   09/20/14 1225 09/20/14 1227 09/20/14 2127 09/21/14 0555  BP:  120/68 105/56 97/52  Pulse:  80 80 69  Temp:  100.3 F (37.9 C) 98.4 F (36.9 C) 98.2 F (36.8 C)  TempSrc:  Oral Oral Oral  Resp:  18 18 18   Height: 5' 7.5" (1.715 m)     Weight: 62.3 kg (137 lb 5.6 oz)     SpO2:  98% 98% 97%  General: Pt is alert, follows commands appropriately, not in acute distress Cardiovascular: Regular rate and rhythm, S1/S2 +, no murmurs, no rubs, no gallops Respiratory: Clear to auscultation bilaterally, no wheezing, no crackles, no rhonchi Abdominal: Soft, non tender, non distended, bowel sounds +, no guarding Extremities: no edema, no cyanosis, pulses palpable bilaterally DP and PT Neuro: Grossly nonfocal  Discharge Instructions  Discharge Instructions    Diet - low sodium heart healthy     Complete by:  As directed      Increase activity slowly    Complete by:  As directed             Medication List    STOP taking these medications        celecoxib 200 MG capsule  Commonly known as:  CELEBREX     hydrOXYzine 25 MG tablet  Commonly known as:  ATARAX/VISTARIL     triamcinolone cream 0.1 %  Commonly known as:  KENALOG      TAKE these medications        cholecalciferol 1000 UNITS tablet  Commonly known as:  VITAMIN D  Take 1,000 Units by mouth daily.     ENZYME DIGEST Caps  Take 1 capsule by mouth daily.     EVENING PRIMROSE OIL PO  Take 1 capsule by mouth daily.     LINZESS 290 MCG Caps capsule  Generic drug:  Linaclotide  Take 290 mcg by mouth daily.     multivitamin tablet  Take 1 tablet by mouth daily.     naproxen sodium 220 MG tablet  Commonly known as:  ANAPROX  Take 220 mg by mouth every 8 (eight) hours as needed (for pain and fever).     ondansetron 8 MG disintegrating tablet  Commonly known as:  ZOFRAN ODT  Take 1 tablet (8 mg total) by mouth every 8 (eight) hours as needed for nausea or vomiting.     SYNTHROID 50 MCG tablet  Generic drug:  levothyroxine  Take 1 tablet by mouth  daily     VASCEPA 1 G Caps  Generic drug:  Icosapent Ethyl  Take 2 capsules by mouth  twice daily at noon and 4PM     VENTOLIN HFA 108 (90 BASE) MCG/ACT inhaler  Generic drug:  albuterol  Inhale 2 puffs into the lungs every 6 (six) hours as needed for wheezing or shortness of breath.            Follow-up Information    Follow up with Elby Showers, MD.   Specialty:  Internal Medicine   Contact information:   [address] [place] [address] [phone]       Call Faye Ramsay, MD.   Specialty:  Internal Medicine   Why:  As needed call my cell phone [phone]   Contact information:   [address] [place] [address] [phone]        The results of significant diagnostics from this  hospitalization (including imaging, microbiology, ancillary and laboratory) are listed below for reference.     Microbiology: No results found for this or any previous visit (from the past 240 hour(s)).   Labs: Basic Metabolic Panel:  Recent Labs Lab 09/16/14 1405 09/20/14 0806 09/20/14 1324 09/21/14 0532  NA 134* 139  --  141  K 4.4 3.8  --  3.8  CL 99 107  --  111  CO2 22 24  --  22  GLUCOSE 99 112*  --  81  BUN 17 12  --  10  CREATININE 0.89 0.79 0.85 0.77  CALCIUM 9.2 9.2  --  8.5*   Liver Function Tests:  Recent Labs Lab 09/16/14 1405 09/20/14 0806 09/21/14 0532  AST 480* 179* 53*  ALT 344* 268* 149*  ALKPHOS 206* 476* 336*  BILITOT 0.7 1.1 0.9  PROT 6.1 7.2 5.4*  ALBUMIN 4.0 3.8 2.6*    Recent Labs Lab 09/16/14 1405 09/20/14 0806 09/21/14 0532  LIPASE 197* 935* 507*  AMYLASE 63  --   --    No results for input(s): AMMONIA in the last 168 hours. CBC:  Recent Labs Lab 09/16/14 1405 09/20/14 0806 09/20/14 1324 09/21/14 0532  WBC 7.2 10.8* 10.2 7.9  NEUTROABS 5.3 8.5*  --   --   HGB 13.1 14.3 12.9 12.0  HCT 37.2 43.2 39.2 35.9*  MCV 83.2 88.0 87.7 88.2  PLT 263 351 328 318   Cardiac Enzymes:  Recent Labs Lab 09/20/14 0806  TROPONINI <0.03    SIGNED: Time coordinating discharge: 30 minutes  Faye Ramsay, MD  [facility] 09/21/2014, 8:39 AM Pager [phone]  If 7PM-7AM, please contact night-coverage [email or web address] Password [email or web address]

## 2014-10-06 DIAGNOSIS — R7989 Other specified abnormal findings of blood chemistry: Secondary | ICD-10-CM | POA: Insufficient documentation

## 2014-10-17 ENCOUNTER — Emergency Department (HOSPITAL_COMMUNITY)
Admission: EM | Admit: 2014-10-17 | Discharge: 2014-10-17 | Disposition: A | Discharge status: Home / Self Care | Payer: 59 | Attending: Emergency Medicine | Admitting: Emergency Medicine

## 2014-10-17 ENCOUNTER — Encounter (HOSPITAL_COMMUNITY): Payer: Self-pay | Admitting: Emergency Medicine

## 2014-10-17 DIAGNOSIS — Z87891 Personal history of nicotine dependence: Secondary | ICD-10-CM | POA: Diagnosis not present

## 2014-10-17 DIAGNOSIS — K859 Acute pancreatitis, unspecified: Secondary | ICD-10-CM | POA: Insufficient documentation

## 2014-10-17 DIAGNOSIS — J45909 Unspecified asthma, uncomplicated: Secondary | ICD-10-CM | POA: Diagnosis not present

## 2014-10-17 DIAGNOSIS — Z79899 Other long term (current) drug therapy: Secondary | ICD-10-CM | POA: Insufficient documentation

## 2014-10-17 DIAGNOSIS — E039 Hypothyroidism, unspecified: Secondary | ICD-10-CM | POA: Diagnosis not present

## 2014-10-17 DIAGNOSIS — R1013 Epigastric pain: Secondary | ICD-10-CM | POA: Diagnosis present

## 2014-10-17 DIAGNOSIS — Z8739 Personal history of other diseases of the musculoskeletal system and connective tissue: Secondary | ICD-10-CM | POA: Diagnosis not present

## 2014-10-17 DIAGNOSIS — Z86010 Personal history of colonic polyps: Secondary | ICD-10-CM | POA: Insufficient documentation

## 2014-10-17 LAB — CBC
HCT: 37.2 % (ref 36.0–46.0)
HEMOGLOBIN: 12.5 g/dL (ref 12.0–15.0)
MCH: 29.3 pg (ref 26.0–34.0)
MCHC: 33.6 g/dL (ref 30.0–36.0)
MCV: 87.1 fL (ref 78.0–100.0)
Platelets: 239 10*3/uL (ref 150–400)
RBC: 4.27 MIL/uL (ref 3.87–5.11)
RDW: 13.2 % (ref 11.5–15.5)
WBC: 7.9 10*3/uL (ref 4.0–10.5)

## 2014-10-17 LAB — COMPREHENSIVE METABOLIC PANEL
ALK PHOS: 189 U/L — AB (ref 38–126)
ALT: 54 U/L (ref 14–54)
AST: 102 U/L — ABNORMAL HIGH (ref 15–41)
Albumin: 3.9 g/dL (ref 3.5–5.0)
Anion gap: 9 (ref 5–15)
BILIRUBIN TOTAL: 1 mg/dL (ref 0.3–1.2)
BUN: 18 mg/dL (ref 6–20)
CALCIUM: 9 mg/dL (ref 8.9–10.3)
CO2: 25 mmol/L (ref 22–32)
Chloride: 105 mmol/L (ref 101–111)
Creatinine, Ser: 0.9 mg/dL (ref 0.44–1.00)
GFR calc Af Amer: >60 mL/min (ref >60)
GFR calc non Af Amer: >60 mL/min (ref >60)
Glucose, Bld: 113 mg/dL — ABNORMAL HIGH (ref 65–99)
Potassium: 3.9 mmol/L (ref 3.5–5.1)
Sodium: 139 mmol/L (ref 135–145)
Total Protein: 6.5 g/dL (ref 6.5–8.1)

## 2014-10-17 LAB — URINALYSIS, ROUTINE W REFLEX MICROSCOPIC
Bilirubin Urine: NEGATIVE
GLUCOSE, UA: NEGATIVE mg/dL
HGB URINE DIPSTICK: NEGATIVE
Ketones, ur: NEGATIVE mg/dL
Leukocytes, UA: NEGATIVE
Nitrite: NEGATIVE
PH: 8 (ref 5.0–8.0)
PROTEIN: NEGATIVE mg/dL
SPECIFIC GRAVITY, URINE: 1.008 (ref 1.005–1.030)
Urobilinogen, UA: 0.2 mg/dL (ref 0.0–1.0)

## 2014-10-17 LAB — LIPASE, BLOOD: Lipase: 194 U/L — ABNORMAL HIGH (ref 22–51)

## 2014-10-17 MED ORDER — OXYCODONE-ACETAMINOPHEN 5-325 MG PO TABS: 1.0000 | ORAL_TABLET | Freq: Four times a day (QID) | ORAL | Status: DC | PRN

## 2014-10-17 MED ORDER — ONDANSETRON HCL 4 MG/2ML IJ SOLN
4.0000 mg | Freq: Once | INTRAMUSCULAR | Status: AC
  Administered 2014-10-17: 4 mg via INTRAVENOUS
  Filled 2014-10-17: qty 2

## 2014-10-17 MED ORDER — MORPHINE SULFATE 4 MG/ML IJ SOLN
4.0000 mg | Freq: Once | INTRAMUSCULAR | Status: AC
  Administered 2014-10-17: 4 mg via INTRAVENOUS
  Filled 2014-10-17: qty 1

## 2014-10-17 MED ORDER — ONDANSETRON HCL 4 MG PO TABS: 4.0000 mg | ORAL_TABLET | Freq: Four times a day (QID) | ORAL | Status: DC

## 2014-10-17 NOTE — ED Provider Notes (Signed)
CSN: [id number]     Arrival date & time 10/17/14  1049 History   First MD Initiated Contact with Patient 10/17/14 1052     Chief Complaint  Patient presents with  . Abdominal Pain     (Consider location/radiation/quality/duration/timing/severity/associated sxs/prior Treatment) HPI  Pt presents with c/o epigastric pain.  She has hx of pancreatitis- was admitted several weeks ago for this- has been seen by GI at [facility] who are doing testing to determine the cause of her pancreatitis.  Pt states the pain started this morning after eating breakfast.  Pain is sharp in nature and constant.  Mild nausea, no vomiting today.  She did have one episode of diarrhea this morning.  She has not had any treatment for this episode.  There are no other associated systemic symptoms, there are no other alleviating or modifying factors.   Past Medical History  Diagnosis Date  . Schatzki's ring   . GERD (gastroesophageal reflux disease)   . Arthritis   . Allergy   . Colon polyps   . Constipation   . Spinal stenosis   . Seasonal affective disorder   . IBS (irritable bowel syndrome)   . Thyroid disease     Hypo  . Osteopenia 2007    T score -1.4  . Asthma     exercise induced   Past Surgical History  Procedure Laterality Date  . Hip surgery Right 2001    Replacement  . Foot surgery Left   . Joint replacement    . Eus N/A 06/12/2014    Procedure: UPPER ENDOSCOPIC ULTRASOUND (EUS) LINEAR;  Surgeon: Beryle Beams, MD;  Location: [facility];  Service: Endoscopy;  Laterality: N/A;   Family History  Problem Relation Age of Onset  . Stroke Mother   . Osteoporosis Mother   . Heart disease Father   . Hyperlipidemia Father   . Lung cancer Father    History  Substance Use Topics  . Smoking status: Former Smoker    Quit date: 05/23/1974  . Smokeless tobacco: Never Used  . Alcohol Use: 0.6 - 1.2 oz/week    1-2 Glasses of wine per week     Comment: 3-4 times a week last dink 09/14/14   OB History     Gravida Para Term Preterm AB TAB SAB Ectopic Multiple Living   1    1     0     Review of Systems  ROS reviewed and all otherwise negative except for mentioned in HPI    Allergies  Review of patient's allergies indicates no known allergies.  Home Medications   Prior to Admission medications   Medication Sig Start Date End Date Taking? Authorizing Provider  cholecalciferol (VITAMIN D) 1000 UNITS tablet Take 1,000 Units by mouth daily.   Yes Historical Provider, MD  Digestive Enzymes (ENZYME DIGEST) CAPS Take 1 capsule by mouth daily.    Yes Historical Provider, MD  EVENING PRIMROSE OIL PO Take 1 capsule by mouth daily.   Yes Historical Provider, MD  LINZESS 290 MCG CAPS capsule Take 290 mcg by mouth daily.  03/19/14  Yes Historical Provider, MD  Multiple Vitamin (MULTIVITAMIN) tablet Take 1 tablet by mouth daily.     Yes Historical Provider, MD  SYNTHROID 50 MCG tablet Take 1 tablet by mouth  daily Patient taking differently: Take 50 MCG  by mouth  daily 01/02/14  Yes Elby Showers, MD  VASCEPA 1 G CAPS Take 2 capsules by mouth  twice daily at  noon and 4PM Patient taking differently: Take 2 g by mouth  twice daily at noon and 4PM 12/30/13  Yes Elby Showers, MD  VENTOLIN HFA 108 (90 BASE) MCG/ACT inhaler Inhale 2 puffs into the lungs every 6 (six) hours as needed for wheezing or shortness of breath.  02/16/12  Yes Historical Provider, MD  ondansetron (ZOFRAN ODT) 8 MG disintegrating tablet Take 1 tablet (8 mg total) by mouth every 8 (eight) hours as needed for nausea or vomiting. Patient not taking: Reported on 09/16/2014 05/26/14   Jola Schmidt, MD  ondansetron (ZOFRAN) 4 MG tablet Take 1 tablet (4 mg total) by mouth every 6 (six) hours. 10/17/14   Alfonzo Beers, MD  oxyCODONE-acetaminophen (PERCOCET/ROXICET) 5-325 MG per tablet Take 1-2 tablets by mouth every 6 (six) hours as needed for severe pain. 10/17/14   Alfonzo Beers, MD   BP 134/78 mmHg  Pulse 74  Temp(Src) 98 F (36.7 C) (Oral)   Resp 18  SpO2 99%  Vitals reviewed Physical Exam  Physical Examination: General appearance - alert, well appearing, and in no distress Mental status - alert, oriented to person, place, and time Eyes - no conjunctival injection, no scleral icterus Mouth - mucous membranes moist, pharynx normal without lesions Chest - clear to auscultation, no wheezes, rales or rhonchi, symmetric air entry Heart - normal rate, regular rhythm, normal S1, S2, no murmurs, rubs, clicks or gallops Abdomen - soft, epigastric tenderness to palpation, nondistended, no masses or organomegaly Extremities - peripheral pulses normal, no pedal edema, no clubbing or cyanosis Skin - normal coloration and turgor, no rashes  ED Course  Procedures (including critical care time) Labs Review Labs Reviewed  COMPREHENSIVE METABOLIC PANEL - Abnormal; Notable for the following:    Glucose, Bld 113 (*)    AST 102 (*)    Alkaline Phosphatase 189 (*)    All other components within normal limits  LIPASE, BLOOD - Abnormal; Notable for the following:    Lipase 194 (*)    All other components within normal limits  CBC  URINALYSIS, ROUTINE W REFLEX MICROSCOPIC (NOT AT [facility])    Imaging Review No results found.   EKG Interpretation None      MDM   Final diagnoses:  Acute pancreatitis, unspecified pancreatitis type    Pt presenting with c/o epigastric pain, c/w her prior episodes of pancreatitis.  Lipase is elevated, she feels much improved after pain meds in the ED.  Pt is eager to try home management- will give rx with pain meds, instructed to take clear liquids only x 48 hours.  Pt will call her [facility] for followup.  Discharged with strict return precautions.  Pt agreeable with plan.    Alfonzo Beers, MD 10/19/14 [phone]

## 2014-10-17 NOTE — Discharge Instructions (Signed)
Return to the ED with any concerns including vomiting and not able to keep down liquids, abdominal pain that is not controlled by pain medication, decreased level of alertness/lethargy, or any other alarming symptoms

## 2014-10-17 NOTE — ED Notes (Signed)
Pt from home c/o chronic abd pain. Pt sts that she has been to this facility and [facility] 4 times since January for the same. Pt sts that was dx with pancreatitis. Pt has gastroenterologist at [place] that she is scheduled for a MRI next week. Pt reports that she has diarrhea this am and pain in the mid to lower abd. Pt adds that she has a "tiny bit of nausea when I move around." but denies emesis. Pt is A&O, ambulatory with steady gait and is in NAD

## 2014-11-17 ENCOUNTER — Other Ambulatory Visit: Payer: Self-pay

## 2014-11-17 ENCOUNTER — Telehealth: Payer: Self-pay | Admitting: Internal Medicine

## 2014-11-17 DIAGNOSIS — T85528A Displacement of other gastrointestinal prosthetic devices, implants and grafts, initial encounter: Secondary | ICD-10-CM

## 2014-11-17 NOTE — Telephone Encounter (Signed)
Have her find out exactly what Xray they want [place] to order. I am not sure

## 2014-11-17 NOTE — Telephone Encounter (Signed)
Being treated at [facility] for pancreatitis per Dr. Verlene Mayer referral.  They put in a stent on 6/14.  They asked patient to have an x-ray 2 weeks later (which would be tomorrow, 6/28).  [facility] wanted her to get the x-ray of her pancreas locally (if possible) this week to make sure that the stent did fall out and just fax the report to them.  Patient wants to know if you will order this x-ray per [facility]'s request and fax to them as continuity of care?    Her insurance changes on Friday of this week.  She'd like to get this done prior to this change.    **Patient will get [place] a name and fax # to send report to.**

## 2014-11-17 NOTE — Telephone Encounter (Signed)
2 view abdomen scheduled to follow up on pancreatic stent. Patient aware order is placed.

## 2014-11-18 ENCOUNTER — Ambulatory Visit
Admission: RE | Admit: 2014-11-18 | Discharge: 2014-11-18 | Disposition: A | Discharge status: Home / Self Care | Payer: 59 | Source: Ambulatory Visit | Attending: Internal Medicine | Admitting: Internal Medicine

## 2014-11-18 ENCOUNTER — Telehealth: Payer: Self-pay | Admitting: Internal Medicine

## 2014-11-18 DIAGNOSIS — T85528A Displacement of other gastrointestinal prosthetic devices, implants and grafts, initial encounter: Secondary | ICD-10-CM

## 2014-11-18 NOTE — Telephone Encounter (Signed)
Received patient's x-ray report - pancreatic stent still in place per x-ray.    Notified Dr. Wells Guiles Burbridge's office @ [place] [phone].  Faxed report to [phone].  Office states Dr. Mont Dutton is in procedure at the present time.  They will have the report and message transferred up to Dr. Mont Dutton and ask her to return the call to Dr. Renold Genta.

## 2014-11-19 NOTE — Telephone Encounter (Signed)
Dr. Mont Dutton called Dr. Renold Genta back and requested that it be arranged to have stent removed here in [place] instead of patient coming back to [facility].  Dr. Renold Genta spoke with Dr. Earlean Shawl.  Patient information faxed to Dr. Liliane Channel office.  Dr. Renold Genta spoke with patient and notified her of the x-ray findings.  Dr. Liliane Channel office notify patient and set-up appointment time/date to have stent removed.

## 2014-12-11 ENCOUNTER — Telehealth: Payer: Self-pay

## 2014-12-11 NOTE — Telephone Encounter (Signed)
Left message for patient to call office to update her mammogram status.

## 2014-12-19 LAB — HM MAMMOGRAPHY

## 2014-12-22 ENCOUNTER — Encounter: Payer: Self-pay | Admitting: Gynecology

## 2014-12-23 ENCOUNTER — Encounter: Payer: Self-pay | Admitting: *Deleted

## 2015-01-05 ENCOUNTER — Other Ambulatory Visit: Payer: Self-pay | Admitting: Internal Medicine

## 2015-01-16 ENCOUNTER — Encounter: Payer: Self-pay | Admitting: Internal Medicine

## 2015-02-16 ENCOUNTER — Encounter (HOSPITAL_COMMUNITY): Payer: Self-pay | Admitting: Emergency Medicine

## 2015-02-16 ENCOUNTER — Emergency Department (HOSPITAL_COMMUNITY)
Admission: EM | Admit: 2015-02-16 | Discharge: 2015-02-16 | Disposition: A | Discharge status: Home / Self Care | Payer: Managed Care, Other (non HMO) | Attending: Emergency Medicine | Admitting: Emergency Medicine

## 2015-02-16 DIAGNOSIS — Z79899 Other long term (current) drug therapy: Secondary | ICD-10-CM | POA: Insufficient documentation

## 2015-02-16 DIAGNOSIS — E039 Hypothyroidism, unspecified: Secondary | ICD-10-CM | POA: Diagnosis not present

## 2015-02-16 DIAGNOSIS — J45909 Unspecified asthma, uncomplicated: Secondary | ICD-10-CM | POA: Diagnosis not present

## 2015-02-16 DIAGNOSIS — K858 Other acute pancreatitis without necrosis or infection: Secondary | ICD-10-CM

## 2015-02-16 DIAGNOSIS — Z86010 Personal history of colonic polyps: Secondary | ICD-10-CM | POA: Diagnosis not present

## 2015-02-16 DIAGNOSIS — M1990 Unspecified osteoarthritis, unspecified site: Secondary | ICD-10-CM | POA: Diagnosis not present

## 2015-02-16 DIAGNOSIS — R1013 Epigastric pain: Secondary | ICD-10-CM

## 2015-02-16 DIAGNOSIS — K5900 Constipation, unspecified: Secondary | ICD-10-CM | POA: Insufficient documentation

## 2015-02-16 DIAGNOSIS — Z87891 Personal history of nicotine dependence: Secondary | ICD-10-CM | POA: Insufficient documentation

## 2015-02-16 DIAGNOSIS — K861 Other chronic pancreatitis: Secondary | ICD-10-CM | POA: Insufficient documentation

## 2015-02-16 LAB — COMPREHENSIVE METABOLIC PANEL
ALK PHOS: 107 U/L (ref 38–126)
ALT: 43 U/L (ref 14–54)
ANION GAP: 8 (ref 5–15)
AST: 87 U/L — ABNORMAL HIGH (ref 15–41)
Albumin: 4.1 g/dL (ref 3.5–5.0)
BILIRUBIN TOTAL: 0.9 mg/dL (ref 0.3–1.2)
BUN: 14 mg/dL (ref 6–20)
CALCIUM: 9.1 mg/dL (ref 8.9–10.3)
CO2: 23 mmol/L (ref 22–32)
Chloride: 110 mmol/L (ref 101–111)
Creatinine, Ser: 0.86 mg/dL (ref 0.44–1.00)
GFR calc non Af Amer: >60 mL/min (ref >60)
Glucose, Bld: 128 mg/dL — ABNORMAL HIGH (ref 65–99)
POTASSIUM: 3.9 mmol/L (ref 3.5–5.1)
SODIUM: 141 mmol/L (ref 135–145)
TOTAL PROTEIN: 6.7 g/dL (ref 6.5–8.1)

## 2015-02-16 LAB — CBC WITH DIFFERENTIAL/PLATELET
BASOS ABS: 0 10*3/uL (ref 0.0–0.1)
Basophils Relative: 0 %
EOS ABS: 0.2 10*3/uL (ref 0.0–0.7)
Eosinophils Relative: 2 %
HCT: 40.9 % (ref 36.0–46.0)
Hemoglobin: 13.6 g/dL (ref 12.0–15.0)
LYMPHS ABS: 0.2 10*3/uL — AB (ref 0.7–4.0)
Lymphocytes Relative: 2 %
MCH: 29.2 pg (ref 26.0–34.0)
MCHC: 33.3 g/dL (ref 30.0–36.0)
MCV: 88 fL (ref 78.0–100.0)
MONO ABS: 0.7 10*3/uL (ref 0.1–1.0)
Monocytes Relative: 6 %
NEUTROS PCT: 90 %
Neutro Abs: 11.1 10*3/uL — ABNORMAL HIGH (ref 1.7–7.7)
PLATELETS: 270 10*3/uL (ref 150–400)
RBC: 4.65 MIL/uL (ref 3.87–5.11)
RDW: 12.8 % (ref 11.5–15.5)
WBC MORPHOLOGY: INCREASED
WBC: 12.2 10*3/uL — AB (ref 4.0–10.5)

## 2015-02-16 LAB — URINALYSIS, ROUTINE W REFLEX MICROSCOPIC
BILIRUBIN URINE: NEGATIVE
Glucose, UA: NEGATIVE mg/dL
Hgb urine dipstick: NEGATIVE
KETONES UR: NEGATIVE mg/dL
NITRITE: NEGATIVE
PH: 8 (ref 5.0–8.0)
PROTEIN: NEGATIVE mg/dL
Specific Gravity, Urine: 1.014 (ref 1.005–1.030)
UROBILINOGEN UA: 0.2 mg/dL (ref 0.0–1.0)

## 2015-02-16 LAB — URINE MICROSCOPIC-ADD ON

## 2015-02-16 LAB — ETHANOL

## 2015-02-16 LAB — LIPASE, BLOOD: LIPASE: 79 U/L — AB (ref 22–51)

## 2015-02-16 MED ORDER — FENTANYL CITRATE (PF) 100 MCG/2ML IJ SOLN
50.0000 ug | Freq: Once | INTRAMUSCULAR | Status: AC
  Administered 2015-02-16: 50 ug via INTRAVENOUS
  Filled 2015-02-16: qty 2

## 2015-02-16 MED ORDER — ONDANSETRON HCL 4 MG/2ML IJ SOLN
4.0000 mg | Freq: Once | INTRAMUSCULAR | Status: AC
  Administered 2015-02-16: 4 mg via INTRAVENOUS
  Filled 2015-02-16: qty 2

## 2015-02-16 MED ORDER — MORPHINE SULFATE (PF) 4 MG/ML IV SOLN
4.0000 mg | Freq: Once | INTRAVENOUS | Status: AC
  Administered 2015-02-16: 4 mg via INTRAVENOUS
  Filled 2015-02-16: qty 1

## 2015-02-16 MED ORDER — ONDANSETRON HCL 4 MG PO TABS: 4.0000 mg | ORAL_TABLET | Freq: Three times a day (TID) | ORAL | Status: DC | PRN

## 2015-02-16 MED ORDER — MORPHINE SULFATE (PF) 2 MG/ML IV SOLN
2.0000 mg | Freq: Once | INTRAVENOUS | Status: AC
  Administered 2015-02-16: 2 mg via INTRAVENOUS
  Filled 2015-02-16: qty 1

## 2015-02-16 MED ORDER — OXYCODONE-ACETAMINOPHEN 5-325 MG PO TABS: 1.0000 | ORAL_TABLET | Freq: Once | ORAL | Status: DC

## 2015-02-16 MED ORDER — SODIUM CHLORIDE 0.9 % IV BOLUS (SEPSIS)
1000.0000 mL | Freq: Once | INTRAVENOUS | Status: AC
  Administered 2015-02-16: 1000 mL via INTRAVENOUS

## 2015-02-16 NOTE — ED Notes (Signed)
Pt states this is night 5 of abdominal pain. She felt nauseated on yesterday. States she last had a bite to eat at 1900 on Sunday. She had broccoli and avocado and an alcoholic beverage.

## 2015-02-16 NOTE — Discharge Instructions (Signed)
Drink plenty of fluids today. If you're doing well later this afternoon or evening he can have a bland diet such as toast, crackers, Campbell's soup, or Jell-O. Take your pain medication as needed. Use the nausea medicine as needed. Call Dr. Lu Duffel Magod's office to let him know that your ED visit today. If you are getting worse such as worsening pain, uncontrollable vomiting, fever he should return to the ED.   Acute Pancreatitis Acute pancreatitis is a disease in which the pancreas becomes suddenly inflamed. The pancreas is a large gland located behind your stomach. The pancreas produces enzymes that help digest food. The pancreas also releases the hormones glucagon and insulin that help regulate blood sugar. Damage to the pancreas occurs when the digestive enzymes from the pancreas are activated and begin attacking the pancreas before being released into the intestine. Most acute attacks last a couple of days and can cause serious complications. Some people become dehydrated and develop low blood pressure. In severe cases, bleeding into the pancreas can lead to shock and can be life-threatening. The lungs, heart, and kidneys may fail. CAUSES  Pancreatitis can happen to anyone. In some cases, the cause is unknown. Most cases are caused by:  Alcohol abuse.  Gallstones. Other less common causes are:  Certain medicines.  Exposure to certain chemicals.  Infection.  Damage caused by an accident (trauma).  Abdominal surgery. SYMPTOMS   Pain in the upper abdomen that may radiate to the back.  Tenderness and swelling of the abdomen.  Nausea and vomiting. DIAGNOSIS  Your caregiver will perform a physical exam. Blood and stool tests may be done to confirm the diagnosis. Imaging tests may also be done, such as X-rays, CT scans, or an ultrasound of the abdomen. TREATMENT  Treatment usually requires a stay in the hospital. Treatment may include:  Pain medicine.  Fluid replacement through an  intravenous line (IV).  Placing a tube in the stomach to remove stomach contents and control vomiting.  Not eating for 3 or 4 days. This gives your pancreas a rest, because enzymes are not being produced that can cause further damage.  Antibiotic medicines if your condition is caused by an infection.  Surgery of the pancreas or gallbladder. HOME CARE INSTRUCTIONS   Follow the diet advised by your caregiver. This may involve avoiding alcohol and decreasing the amount of fat in your diet.  Eat smaller, more frequent meals. This reduces the amount of digestive juices the pancreas produces.  Drink enough fluids to keep your urine clear or pale yellow.  Only take over-the-counter or prescription medicines as directed by your caregiver.  Avoid drinking alcohol if it caused your condition.  Do not smoke.  Get plenty of rest.  Check your blood sugar at home as directed by your caregiver.  Keep all follow-up appointments as directed by your caregiver. SEEK MEDICAL CARE IF:   You do not recover as quickly as expected.  You develop new or worsening symptoms.  You have persistent pain, weakness, or nausea.  You recover and then have another episode of pain. SEEK IMMEDIATE MEDICAL CARE IF:   You are unable to eat or keep fluids down.  Your pain becomes severe.  You have a fever or persistent symptoms for more than 2 to 3 days.  You have a fever and your symptoms suddenly get worse.  Your skin or the white part of your eyes turn yellow (jaundice).  You develop vomiting.  You feel dizzy, or you faint.  Your  blood sugar is high (over 300 mg/dL). MAKE SURE YOU:   Understand these instructions.  Will watch your condition.  Will get help right away if you are not doing well or get worse. Document Released: 05/09/2005 Document Revised: 11/08/2011 Document Reviewed: 08/18/2011 [facility] Patient Information 2015 [place], [place]. This information is not intended to replace  advice given to you by your health care provider. Make sure you discuss any questions you have with your health care provider.

## 2015-02-16 NOTE — ED Notes (Signed)
Pt states she is having upper abd pain with nausea  Pt states she has some nausea Saturday night but it went away  Pt states she woke with pain and nausea this morning  Pt states she has hx of pancreatitis

## 2015-02-16 NOTE — ED Provider Notes (Addendum)
CSN: [id number]     Arrival date & time 02/16/15  0407 History   First MD Initiated Contact with Patient 02/16/15 0500    Chief Complaint  Patient presents with  . Abdominal Pain     (Consider location/radiation/quality/duration/timing/severity/associated sxs/prior Treatment) HPI patient reports she started having abdominal pains in December. 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 In June she had her sphincter of Odi stretched and had a stent placed in her biliary duct and pancreatic duct. She states 2 weeks ago the biliary duct had fallen out of position and it it was removed at [facility]. She states at midnight tonight she was awakened from sleep with epigastric pain that radiates up into her chest. The pain is sharp and constant. She feels like this is the same pain she's had 2 times before with her pancreatitis. She had nausea all day yesterday which she's never had before. She states her nausea is gone at this time. She denies diarrhea. She states last night at 5 PM she had a gin and  tonic, and broccoli and  avocado. She states she is a vegetarian.   PCP Dr Renold Genta GI Dr Pearletha Forge GI Dr Hanley Ben  Past Medical History  Diagnosis Date  . Schatzki's ring   . GERD (gastroesophageal reflux disease)   . Arthritis   . Allergy   . Colon polyps   . Constipation   . Spinal stenosis   . Seasonal affective disorder   . IBS (irritable bowel syndrome)   . Thyroid disease     Hypo  . Osteopenia 2007    T score -1.4  . Asthma     exercise induced   Past Surgical History  Procedure Laterality Date  . Hip surgery Right 2001    Replacement  . Foot surgery Left   . Joint replacement    . Eus N/A 06/12/2014    Procedure: UPPER ENDOSCOPIC ULTRASOUND (EUS) LINEAR;  Surgeon: Beryle Beams, MD;  Location: [facility];  Service: Endoscopy;  Laterality: N/A;   Family History  Problem Relation Age of Onset  .  Stroke Mother   . Osteoporosis Mother   . Heart disease Father   . Hyperlipidemia Father   . Lung cancer Father    Social History  Substance Use Topics  . Smoking status: Former Smoker    Quit date: 05/23/1974  . Smokeless tobacco: Never Used  . Alcohol Use: 0.6 - 1.2 oz/week    1-2 Glasses of wine per week     Comment: 3-4 times a week last dink 09/14/14   Lives at home  OB History    Gravida Para Term Preterm AB TAB SAB Ectopic Multiple Living   1    1     0     Review of Systems  All other systems reviewed and are negative.     Allergies  Review of patient's allergies indicates no known allergies.  Home Medications   Prior to Admission medications   Medication Sig Start Date End Date Taking? Authorizing Provider  co-enzyme Q-10 30 MG capsule Take 100 mg by mouth daily.   Yes Historical Provider, MD  LINZESS 290 MCG CAPS capsule Take 290 mcg by mouth daily.  03/19/14  Yes Historical Provider, MD  MAGNESIUM PO Take 2 tablets by mouth 2 (two) times daily.   Yes Historical Provider, MD  Misc Natural Products (TUMERSAID PO) Take  900 mg by mouth 2 (two) times daily.   Yes Historical Provider, MD  Multiple Vitamin (MULTIVITAMIN) tablet Take 1 tablet by mouth daily.     Yes Historical Provider, MD  polyethylene glycol (MIRALAX / GLYCOLAX) packet Take 17 g by mouth 2 (two) times daily.   Yes Historical Provider, MD  SYNTHROID 50 MCG tablet Take 1 tablet by mouth  daily Patient taking differently: Take 50 MCG  by mouth  daily 01/02/14  Yes Elby Showers, MD  VASCEPA 1 G CAPS Take 2 capsules by mouth  twice daily at noon and 4PM 01/05/15  Yes Elby Showers, MD  VENTOLIN HFA 108 (90 BASE) MCG/ACT inhaler Inhale 2 puffs into the lungs every 6 (six) hours as needed for wheezing or shortness of breath.  02/16/12  Yes Historical Provider, MD  ondansetron (ZOFRAN ODT) 8 MG disintegrating tablet Take 1 tablet (8 mg total) by mouth every 8 (eight) hours as needed for nausea or  vomiting. Patient not taking: Reported on 09/16/2014 05/26/14   Jola Schmidt, MD  ondansetron [facility]) 4 MG tablet Take 1 tablet (4 mg total) by mouth every 8 (eight) hours as needed for nausea or vomiting. 02/16/15   Rolland Porter, MD  oxyCODONE-acetaminophen (PERCOCET/ROXICET) 5-325 MG per tablet Take 1-2 tablets by mouth every 6 (six) hours as needed for severe pain. Patient not taking: Reported on 02/16/2015 10/17/14   Alfonzo Beers, MD   BP 127/66 mmHg  Pulse 88  Temp(Src) 98.3 F (36.8 C) (Oral)  Resp 18  Ht 5\' 8"  (1.727 m)  Wt 132 lb (59.875 kg)  BMI 20.08 kg/m2  SpO2 94%  Vital signs normal   Physical Exam  Constitutional: She is oriented to person, place, and time. She appears well-developed and well-nourished.  Non-toxic appearance. She does not appear ill. No distress.  HENT:  Head: Normocephalic and atraumatic.  Right Ear: External ear normal.  Left Ear: External ear normal.  Nose: Nose normal. No mucosal edema or rhinorrhea.  Mouth/Throat: Oropharynx is clear and moist and mucous membranes are normal. No dental abscesses or uvula swelling.  Eyes: Conjunctivae and EOM are normal. Pupils are equal, round, and reactive to light.  Neck: Normal range of motion and full passive range of motion without pain. Neck supple.  Cardiovascular: Normal rate, regular rhythm and normal heart sounds.  Exam reveals no gallop and no friction rub.   No murmur heard. Pulmonary/Chest: Effort normal and breath sounds normal. No respiratory distress. She has no wheezes. She has no rhonchi. She has no rales. She exhibits no tenderness and no crepitus.  Abdominal: Soft. Normal appearance and bowel sounds are normal. She exhibits no distension. There is tenderness. There is no rebound and no guarding.  Although patient states her pain is epigastric she's actually tender to palpation diffusely in her abdomen with the worst discomfort being in the epigastric area.  Musculoskeletal: Normal range of motion. She  exhibits no edema or tenderness.  Moves all extremities well.   Neurological: She is alert and oriented to person, place, and time. She has normal strength. No cranial nerve deficit.  Skin: Skin is warm, dry and intact. No rash noted. No erythema. No pallor.  Psychiatric: She has a normal mood and affect. Her speech is normal and behavior is normal. Her mood appears not anxious.  Nursing note and vitals reviewed.   ED Course  Procedures (including critical care time)  Medications  oxyCODONE-acetaminophen (PERCOCET/ROXICET) 5-325 MG per tablet 1 tablet (not administered)  sodium chloride 0.9 % bolus 1,000 mL (0 mLs Intravenous Stopped 02/16/15 0636)  ondansetron (ZOFRAN) injection 4 mg (4 mg Intravenous Given 02/16/15 0538)  morphine 4 MG/ML injection 4 mg (4 mg Intravenous Given 02/16/15 0538)  morphine 2 MG/ML injection 2 mg (2 mg Intravenous Given 02/16/15 0650)   Recheck at 6:30 AM. Patient states her pain is improved however still present. She was given additional pain medication. She was given her test results. She states she feels like she will be able to go home from this visit. She states she has pain pill she can take at home. I'm going to see if I can contact Dr. Watt Climes let him know about her ED visit this morning.  6:51 AM patient discussed with Dr. Michail Sermon, GI. He states he will leave a message for Dr. Jovita Kussmaul to know patient was in the ED.  Patient rechecked at 7:05 AM. She states the second dose of pain medicine didn't help as well as the first. We discussed trying oral pain medicine since that which be what she would go home on to see if she can control her pain that way. She is agreeable. She also states she has pain medication she can take at home.  Recheck at 7:30 AM patient states her pain is getting worse. IV fentanyl and IV Zofran was ordered. I will have Dr Hillard Danker recheck her in the next hour.   Labs Review Results for orders placed or performed during the hospital  encounter of 02/16/15  Comprehensive metabolic panel  Result Value Ref Range   Sodium 141 135 - 145 mmol/L   Potassium 3.9 3.5 - 5.1 mmol/L   Chloride 110 101 - 111 mmol/L   CO2 23 22 - 32 mmol/L   Glucose, Bld 128 (H) 65 - 99 mg/dL   BUN 14 6 - 20 mg/dL   Creatinine, Ser 0.86 0.44 - 1.00 mg/dL   Calcium 9.1 8.9 - 10.3 mg/dL   Total Protein 6.7 6.5 - 8.1 g/dL   Albumin 4.1 3.5 - 5.0 g/dL   AST 87 (H) 15 - 41 U/L   ALT 43 14 - 54 U/L   Alkaline Phosphatase 107 38 - 126 U/L   Total Bilirubin 0.9 0.3 - 1.2 mg/dL   GFR calc non Af Amer >60 >60 mL/min   GFR calc Af Amer >60 >60 mL/min   Anion gap 8 5 - 15  Ethanol  Result Value Ref Range   Alcohol, Ethyl (B) <5 <5 mg/dL  Lipase, blood  Result Value Ref Range   Lipase 79 (H) 22 - 51 U/L  CBC with Differential  Result Value Ref Range   WBC 12.2 (H) 4.0 - 10.5 K/uL   RBC 4.65 3.87 - 5.11 MIL/uL   Hemoglobin 13.6 12.0 - 15.0 g/dL   HCT 40.9 36.0 - 46.0 %   MCV 88.0 78.0 - 100.0 fL   MCH 29.2 26.0 - 34.0 pg   MCHC 33.3 30.0 - 36.0 g/dL   RDW 12.8 11.5 - 15.5 %   Platelets 270 150 - 400 K/uL   Neutrophils Relative % 90 %   Lymphocytes Relative 2 %   Monocytes Relative 6 %   Eosinophils Relative 2 %   Basophils Relative 0 %   Neutro Abs 11.1 (H) 1.7 - 7.7 K/uL   Lymphs Abs 0.2 (L) 0.7 - 4.0 K/uL   Monocytes Absolute 0.7 0.1 - 1.0 K/uL   Eosinophils Absolute 0.2 0.0 - 0.7 K/uL   Basophils  Absolute 0.0 0.0 - 0.1 K/uL   WBC Morphology INCREASED BANDS (>20% BANDS)   Urinalysis, Routine w reflex microscopic  Result Value Ref Range   Color, Urine YELLOW YELLOW   APPearance CLOUDY (A) CLEAR   Specific Gravity, Urine 1.014 1.005 - 1.030   pH 8.0 5.0 - 8.0   Glucose, UA NEGATIVE NEGATIVE mg/dL   Hgb urine dipstick NEGATIVE NEGATIVE   Bilirubin Urine NEGATIVE NEGATIVE   Ketones, ur NEGATIVE NEGATIVE mg/dL   Protein, ur NEGATIVE NEGATIVE mg/dL   Urobilinogen, UA 0.2 0.0 - 1.0 mg/dL   Nitrite NEGATIVE NEGATIVE   Leukocytes, UA  TRACE (A) NEGATIVE  Urine microscopic-add on  Result Value Ref Range   Squamous Epithelial / LPF RARE RARE   WBC, UA 7-10 <3 WBC/hpf   RBC / HPF 0-2 <3 RBC/hpf   Bacteria, UA RARE RARE   Laboratory interpretation all normal except elevation of her SGOT (she has been higher in the past especially in April) and mild elevation of her lipase (her initial lowest lipase was 79 in January, in April it was almost 1000, and since then it has been between 150 and 500.     Imaging Review No results found. I have personally reviewed and evaluated these images and lab results as part of my medical decision-making.   EKG Interpretation None      MDM   Final diagnoses:  Epigastric pain  Other pancreatitis   New Prescriptions   ONDANSETRON (ZOFRAN) 4 MG TABLET    Take 1 tablet (4 mg total) by mouth every 8 (eight) hours as needed for nausea or vomiting.     Disposition pending   Rolland Porter, MD, Barbette Or, MD 02/16/15 [place], MD 02/16/15 [phone]

## 2015-02-17 LAB — URINE CULTURE

## 2015-03-09 ENCOUNTER — Other Ambulatory Visit: Payer: Managed Care, Other (non HMO) | Admitting: Internal Medicine

## 2015-03-09 DIAGNOSIS — Z130 Encounter for screening for diseases of the blood and blood-forming organs and certain disorders involving the immune mechanism: Secondary | ICD-10-CM

## 2015-03-09 DIAGNOSIS — E785 Hyperlipidemia, unspecified: Secondary | ICD-10-CM

## 2015-03-09 DIAGNOSIS — Z1321 Encounter for screening for nutritional disorder: Secondary | ICD-10-CM

## 2015-03-09 DIAGNOSIS — E038 Other specified hypothyroidism: Secondary | ICD-10-CM

## 2015-03-09 DIAGNOSIS — Z13220 Encounter for screening for lipoid disorders: Secondary | ICD-10-CM

## 2015-03-09 DIAGNOSIS — Z0000 Encounter for general adult medical examination without abnormal findings: Secondary | ICD-10-CM

## 2015-03-09 LAB — COMPLETE METABOLIC PANEL WITH GFR
ALK PHOS: 142 U/L — AB (ref 33–130)
ALT: 36 U/L — ABNORMAL HIGH (ref 6–29)
AST: 40 U/L — ABNORMAL HIGH (ref 10–35)
Albumin: 4.1 g/dL (ref 3.6–5.1)
BUN: 12 mg/dL (ref 7–25)
CO2: 29 mmol/L (ref 20–31)
Calcium: 9.5 mg/dL (ref 8.6–10.4)
Chloride: 106 mmol/L (ref 98–110)
Creat: 0.86 mg/dL (ref 0.50–0.99)
GFR, EST NON AFRICAN AMERICAN: 70 mL/min (ref >=60)
GFR, Est African American: 81 mL/min (ref >=60)
GLUCOSE: 92 mg/dL (ref 65–99)
POTASSIUM: 4.1 mmol/L (ref 3.5–5.3)
Sodium: 142 mmol/L (ref 135–146)
Total Bilirubin: 0.5 mg/dL (ref 0.2–1.2)
Total Protein: 6.5 g/dL (ref 6.1–8.1)

## 2015-03-09 LAB — CBC WITH DIFFERENTIAL/PLATELET
BASOS PCT: 1 % (ref 0–1)
Basophils Absolute: 0 10*3/uL (ref 0.0–0.1)
Eosinophils Absolute: 0.4 10*3/uL (ref 0.0–0.7)
Eosinophils Relative: 9 % — ABNORMAL HIGH (ref 0–5)
HEMATOCRIT: 41.2 % (ref 36.0–46.0)
HEMOGLOBIN: 13.8 g/dL (ref 12.0–15.0)
LYMPHS PCT: 27 % (ref 12–46)
Lymphs Abs: 1.3 10*3/uL (ref 0.7–4.0)
MCH: 29.2 pg (ref 26.0–34.0)
MCHC: 33.5 g/dL (ref 30.0–36.0)
MCV: 87.1 fL (ref 78.0–100.0)
MPV: 8.5 fL — ABNORMAL LOW (ref 8.6–12.4)
Monocytes Absolute: 0.2 10*3/uL (ref 0.1–1.0)
Monocytes Relative: 5 % (ref 3–12)
NEUTROS ABS: 2.8 10*3/uL (ref 1.7–7.7)
NEUTROS PCT: 58 % (ref 43–77)
PLATELETS: 332 10*3/uL (ref 150–400)
RBC: 4.73 MIL/uL (ref 3.87–5.11)
RDW: 13.6 % (ref 11.5–15.5)
WBC: 4.9 10*3/uL (ref 4.0–10.5)

## 2015-03-09 LAB — LIPID PANEL
CHOL/HDL RATIO: 2.6 ratio (ref <=5.0)
Cholesterol: 195 mg/dL (ref 125–200)
HDL: 76 mg/dL (ref >=46)
LDL CALC: 108 mg/dL (ref <130)
Triglycerides: 54 mg/dL (ref <150)
VLDL: 11 mg/dL (ref <30)

## 2015-03-09 LAB — TSH: TSH: 2.007 u[IU]/mL (ref 0.350–4.500)

## 2015-03-10 ENCOUNTER — Encounter: Payer: Self-pay | Admitting: Internal Medicine

## 2015-03-10 ENCOUNTER — Ambulatory Visit (INDEPENDENT_AMBULATORY_CARE_PROVIDER_SITE_OTHER): Payer: Managed Care, Other (non HMO) | Admitting: Internal Medicine

## 2015-03-10 VITALS — BP 122/78 | HR 70 | Temp 97.8°F | Ht 67.0 in | Wt 136.0 lb

## 2015-03-10 DIAGNOSIS — Z789 Other specified health status: Secondary | ICD-10-CM

## 2015-03-10 DIAGNOSIS — Z860100 Personal history of colon polyps, unspecified: Secondary | ICD-10-CM

## 2015-03-10 DIAGNOSIS — Z0000 Encounter for general adult medical examination without abnormal findings: Secondary | ICD-10-CM

## 2015-03-10 DIAGNOSIS — Z23 Encounter for immunization: Secondary | ICD-10-CM

## 2015-03-10 DIAGNOSIS — F338 Other recurrent depressive disorders: Secondary | ICD-10-CM

## 2015-03-10 DIAGNOSIS — Z8739 Personal history of other diseases of the musculoskeletal system and connective tissue: Secondary | ICD-10-CM

## 2015-03-10 DIAGNOSIS — J45990 Exercise induced bronchospasm: Secondary | ICD-10-CM | POA: Diagnosis not present

## 2015-03-10 DIAGNOSIS — J309 Allergic rhinitis, unspecified: Secondary | ICD-10-CM | POA: Diagnosis not present

## 2015-03-10 DIAGNOSIS — K5900 Constipation, unspecified: Secondary | ICD-10-CM

## 2015-03-10 DIAGNOSIS — J069 Acute upper respiratory infection, unspecified: Secondary | ICD-10-CM | POA: Diagnosis not present

## 2015-03-10 DIAGNOSIS — J9801 Acute bronchospasm: Secondary | ICD-10-CM

## 2015-03-10 DIAGNOSIS — Z8719 Personal history of other diseases of the digestive system: Secondary | ICD-10-CM | POA: Diagnosis not present

## 2015-03-10 DIAGNOSIS — E785 Hyperlipidemia, unspecified: Secondary | ICD-10-CM | POA: Diagnosis not present

## 2015-03-10 DIAGNOSIS — F39 Unspecified mood [affective] disorder: Secondary | ICD-10-CM

## 2015-03-10 LAB — VITAMIN D 25 HYDROXY (VIT D DEFICIENCY, FRACTURES): Vit D, 25-Hydroxy: 40 ng/mL (ref 30–100)

## 2015-03-10 LAB — POCT URINALYSIS DIPSTICK
BILIRUBIN UA: NEGATIVE
GLUCOSE UA: NEGATIVE
KETONES UA: NEGATIVE
Leukocytes, UA: NEGATIVE
NITRITE UA: NEGATIVE
PH UA: 8
Protein, UA: NEGATIVE
RBC UA: NEGATIVE
Urobilinogen, UA: NEGATIVE

## 2015-03-10 MED ORDER — AZITHROMYCIN 250 MG PO TABS: ORAL_TABLET | ORAL | Status: DC

## 2015-03-10 MED ORDER — SYNTHROID 50 MCG PO TABS: 50.0000 ug | ORAL_TABLET | Freq: Every day | ORAL | Status: DC

## 2015-03-10 NOTE — Patient Instructions (Addendum)
It was a pleasure to see you today. Take Z-pak and use inhaler. Return in 6-12 months.

## 2015-03-23 ENCOUNTER — Encounter: Payer: Self-pay | Admitting: Gynecology

## 2015-03-23 ENCOUNTER — Ambulatory Visit (INDEPENDENT_AMBULATORY_CARE_PROVIDER_SITE_OTHER): Payer: Managed Care, Other (non HMO) | Admitting: Gynecology

## 2015-03-23 VITALS — BP 120/76 | Ht 67.5 in | Wt 137.0 lb

## 2015-03-23 DIAGNOSIS — N952 Postmenopausal atrophic vaginitis: Secondary | ICD-10-CM

## 2015-03-23 DIAGNOSIS — Z01419 Encounter for gynecological examination (general) (routine) without abnormal findings: Secondary | ICD-10-CM

## 2015-03-23 DIAGNOSIS — M8580 Other specified disorders of bone density and structure, unspecified site: Secondary | ICD-10-CM | POA: Diagnosis not present

## 2015-03-23 NOTE — Progress Notes (Signed)
Chelsea Cantrell [facility] November 16, 1945 [id number]        69 y.o.  G1P0010  No LMP recorded. Patient is postmenopausal. for annual exam.  Doing well.  Past medical history,surgical history, problem list, medications, allergies, family history and social history were all reviewed and documented as reviewed in the EPIC chart.  ROS:  Performed with pertinent positives and negatives included in the history, assessment and plan.   Additional significant findings :  none   Exam: Kim Counsellor Vitals:   03/23/15 0959  BP: 120/76  Height: 5' 7.5" (1.715 m)  Weight: 137 lb (62.143 kg)   General appearance:  Normal affect, orientation and appearance. Skin: Grossly normal HEENT: Without gross lesions.  No cervical or supraclavicular adenopathy. Thyroid normal.  Lungs:  Clear without wheezing, rales or rhonchi Cardiac: RR, without RMG Abdominal:  Soft, nontender, without masses, guarding, rebound, organomegaly or hernia Breasts:  Examined lying and sitting without masses, retractions, discharge or axillary adenopathy. Pelvic:  Ext/BUS/vagina with atrophic changes  Cervix with atrophic changes  Uterus anteverted, normal size, shape and contour, midline and mobile nontender   Adnexa  Without masses or tenderness    Anus and perineum  Normal   Rectovaginal  Normal sphincter tone without palpated masses or tenderness.    Assessment/Plan:  69 y.o. G1P0010 female for annual exam.   1. Postmenopausal/atrophic genital changes. Patient doing well without significant hot flushes, night sweats, vaginal dryness or any vaginal bleeding.continue to monitor and report any vaginal bleeding. 2. Osteopenia. DEXA 2007 T score -1.4. Had recommended DEXA previously but she never followed up to schedule. I again emphasized the need to go ahead and schedule a DEXA so we can document stability of her bones. She does exercise vigorously. Patient agrees to schedule.  Recent vitamin D 40. 3. Mammography 11/2014. Continue  with annual mammography. SBE monthly reviewed. 4. Pap smear/HPV 2014 negative. No Pap smear done today. No history of significant abnormal Pap smears previously. 5. Colonoscopy 2011. Repeat at their recommended interval. 6. Health maintenance. No routine blood work done as patient reports this done at her primary physician's office. Follow up 1 year, sooner as needed.   Anastasio Auerbach MD, 10:22 AM 03/23/2015

## 2015-03-23 NOTE — Progress Notes (Signed)
Subjective:    Patient ID: Chelsea Cantrell, female    DOB: 06/11/1945, 69 y.o.   MRN: [id number]  HPI 69 year old Female in today for health maintenance exam. She has a history of recurrent pancreatitis which is been going on for most of the year. Most recent episode was September 26 and lipase was 79. She's been seen at [facility]. In June she had stent placed in pancreatic and bile duct. She had a sphincterotomy as well. This helped control recurrent episodes quite well. Subsequently the stent was removed by Dr. Earlean Shawl here [place].    history of hypothyroidism, GE reflux, exercise-induced asthma, history of shots Keys ring, allergic rhinitis, hyperplastic colon polyps, estrogen replacement, constipation, history of spinal stenosis, seasonal affective disorder.    No known drug allergies. Intolerant of generic Wellbutrin.    has been evaluated at low Bauer allergy and takes allergy injections. Allergic to cats dogs and pollen. Was tried on Strattera in 2003 for attention deficit but currently does not take any medicine for attention deficit.   Is seeing Dr. 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  Has taken Lovaza for hyperlipidemia for some time.  Has taken Amitiza for chronic constipation but currently on Linzess. Takes Celebrex and chondroitin sulfate for musculoskeletal pain.   Social history: Single, never married, no children. She works as an Programme researcher, broadcasting/film/video for [employer]. She exercises routinely. She did smoke prior to 1976 but currently nonsmoker. Does not drink coffee. Is a vegetarian. Drinks wine sometimes but  have asked her to cut back on that since episodes of pancreatitis   Fundus to study done through GYN office.  Family history: Father died at age 45 of lung cancer with history of MI at 42 and hyperlipidemia. Mother died at age 74 but had a stroke 4 years before she passed away. One  brother and one sister in good health.  Now has acute URI with some mild wheezing. Has Ventolin inhaler. Treat with Z-Pak.  Review of Systems  Constitutional: Negative.   Respiratory: Negative.   Gastrointestinal:       Multiple episodes of pancreatitis this past year  Allergic/Immunologic: Positive for environmental allergies.  Psychiatric/Behavioral: Negative.        Objective:   Physical Exam  Constitutional: She is oriented to person, place, and time. She appears well-developed and well-nourished. No distress.  HENT:  Head: Normocephalic and atraumatic.  Right Ear: External ear normal.  Left Ear: External ear normal.  Mouth/Throat: Oropharynx is clear and moist. No oropharyngeal exudate.  Eyes: Conjunctivae and EOM are normal. Pupils are equal, round, and reactive to light. Right eye exhibits no discharge. Left eye exhibits no discharge. No scleral icterus.  Neck: Neck supple. No JVD present. No thyromegaly present.  Cardiovascular: Normal rate, regular rhythm and normal heart sounds.   No murmur heard. Pulmonary/Chest: Effort normal and breath sounds normal. She has no wheezes. She has no rales.  Abdominal: She exhibits no distension. There is no tenderness. There is no rebound and no guarding.  Genitourinary:  Deferred to GYN  Musculoskeletal: She exhibits no edema.  Neurological: She is alert and oriented to person, place, and time. She has normal reflexes. No cranial nerve deficit.  Skin: Skin is warm and dry. No rash noted. She is not diaphoretic.  Psychiatric: She has a normal mood and affect. Her behavior is normal. Judgment and thought content normal.  Vitals reviewed.  Assessment & Plan:   History of pancreatitis-less serious episode status post stent placement  Chronic constipation-treated with Linzess  History of hyperplastic colon polyps  History of spinal stenosis and lumbar generative disc disease previously treated with epidural  steroids  History of Schatzki's ring 2010  Vegetarian diet  Hyperlipidemia  History of hip resurfacing 2001  History of allergic rhinitis  History of seasonal affective disorder  Musculoskeletal pain and osteoarthritis  Acute URI with some mild wheezing-treat with Zithromax Z-PAK  Plan: Continue same medications and return in 6-12 months or as needed.

## 2015-03-23 NOTE — Patient Instructions (Signed)
Follow up for your bone density as scheduled.  You may obtain a copy of any labs that were done today by logging onto MyChart as outlined in the instructions provided with your AVS (after visit summary). The office will not call with normal lab results but certainly if there are any significant abnormalities then we will contact you.   Health Maintenance Adopting a healthy lifestyle and getting preventive care can go a long way to promote health and wellness. Talk with your health care provider about what schedule of regular examinations is right for you. This is a good chance for you to check in with your provider about disease prevention and staying healthy. In between checkups, there are plenty of things you can do on your own. Experts have done a lot of research about which lifestyle changes and preventive measures are most likely to keep you healthy. Ask your health care provider for more information. WEIGHT AND DIET  Eat a healthy diet  Be sure to include plenty of vegetables, fruits, low-fat dairy products, and lean protein.  Do not eat a lot of foods high in solid fats, added sugars, or salt.  Get regular exercise. This is one of the most important things you can do for your health.  Most adults should exercise for at least 150 minutes each week. The exercise should increase your heart rate and make you sweat (moderate-intensity exercise).  Most adults should also do strengthening exercises at least twice a week. This is in addition to the moderate-intensity exercise.  Maintain a healthy weight  Body mass index (BMI) is a measurement that can be used to identify possible weight problems. It estimates body fat based on height and weight. Your health care provider can help determine your BMI and help you achieve or maintain a healthy weight.  For females 28 years of age and older:   A BMI below 18.5 is considered underweight.  A BMI of 18.5 to 24.9 is normal.  A BMI of 25 to 29.9  is considered overweight.  A BMI of 30 and above is considered obese.  Watch levels of cholesterol and blood lipids  You should start having your blood tested for lipids and cholesterol at 67 years of age, then have this test every 5 years.  You may need to have your cholesterol levels checked more often if:  Your lipid or cholesterol levels are high.  You are older than 67 years of age.  You are at high risk for heart disease.  CANCER SCREENING   Lung Cancer  Lung cancer screening is recommended for adults 57-67 years old who are at high risk for lung cancer because of a history of smoking.  A yearly low-dose CT scan of the lungs is recommended for people who:  Currently smoke.  Have quit within the past 15 years.  Have at least a 30-pack-year history of smoking. A pack year is smoking an average of one pack of cigarettes a day for 1 year.  Yearly screening should continue until it has been 15 years since you quit.  Yearly screening should stop if you develop a health problem that would prevent you from having lung cancer treatment.  Breast Cancer  Practice breast self-awareness. This means understanding how your breasts normally appear and feel.  It also means doing regular breast self-exams. Let your health care provider know about any changes, no matter how small.  If you are in your 20s or 30s, you should have a clinical breast  exam (CBE) by a health care provider every 1-3 years as part of a regular health exam.  If you are 36 or older, have a CBE every year. Also consider having a breast X-ray (mammogram) every year.  If you have a family history of breast cancer, talk to your health care provider about genetic screening.  If you are at high risk for breast cancer, talk to your health care provider about having an MRI and a mammogram every year.  Breast cancer gene (BRCA) assessment is recommended for women who have family members with BRCA-related cancers.  BRCA-related cancers include:  Breast.  Ovarian.  Tubal.  Peritoneal cancers.  Results of the assessment will determine the need for genetic counseling and BRCA1 and BRCA2 testing. Cervical Cancer Routine pelvic examinations to screen for cervical cancer are no longer recommended for nonpregnant women who are considered low risk for cancer of the pelvic organs (ovaries, uterus, and vagina) and who do not have symptoms. A pelvic examination may be necessary if you have symptoms including those associated with pelvic infections. Ask your health care provider if a screening pelvic exam is right for you.   The Pap test is the screening test for cervical cancer for women who are considered at risk.  If you had a hysterectomy for a problem that was not cancer or a condition that could lead to cancer, then you no longer need Pap tests.  If you are older than 65 years, and you have had normal Pap tests for the past 10 years, you no longer need to have Pap tests.  If you have had past treatment for cervical cancer or a condition that could lead to cancer, you need Pap tests and screening for cancer for at least 20 years after your treatment.  If you no longer get a Pap test, assess your risk factors if they change (such as having a new sexual partner). This can affect whether you should start being screened again.  Some women have medical problems that increase their chance of getting cervical cancer. If this is the case for you, your health care provider may recommend more frequent screening and Pap tests.  The human papillomavirus (HPV) test is another test that may be used for cervical cancer screening. The HPV test looks for the virus that can cause cell changes in the cervix. The cells collected during the Pap test can be tested for HPV.  The HPV test can be used to screen women 8 years of age and older. Getting tested for HPV can extend the interval between normal Pap tests from three to  five years.  An HPV test also should be used to screen women of any age who have unclear Pap test results.  After 67 years of age, women should have HPV testing as often as Pap tests.  Colorectal Cancer  This type of cancer can be detected and often prevented.  Routine colorectal cancer screening usually begins at 67 years of age and continues through 67 years of age.  Your health care provider may recommend screening at an earlier age if you have risk factors for colon cancer.  Your health care provider may also recommend using home test kits to check for hidden blood in the stool.  A small camera at the end of a tube can be used to examine your colon directly (sigmoidoscopy or colonoscopy). This is done to check for the earliest forms of colorectal cancer.  Routine screening usually begins at age 26.  Direct examination of the colon should be repeated every 5-10 years through 67 years of age. However, you may need to be screened more often if early forms of precancerous polyps or small growths are found. Skin Cancer  Check your skin from head to toe regularly.  Tell your health care provider about any new moles or changes in moles, especially if there is a change in a mole's shape or color.  Also tell your health care provider if you have a mole that is larger than the size of a pencil eraser.  Always use sunscreen. Apply sunscreen liberally and repeatedly throughout the day.  Protect yourself by wearing long sleeves, pants, a wide-brimmed hat, and sunglasses whenever you are outside. HEART DISEASE, DIABETES, AND HIGH BLOOD PRESSURE   Have your blood pressure checked at least every 1-2 years. High blood pressure causes heart disease and increases the risk of stroke.  If you are between 76 years and 55 years old, ask your health care provider if you should take aspirin to prevent strokes.  Have regular diabetes screenings. This involves taking a blood sample to check your  fasting blood sugar level.  If you are at a normal weight and have a low risk for diabetes, have this test once every three years after 67 years of age.  If you are overweight and have a high risk for diabetes, consider being tested at a younger age or more often. PREVENTING INFECTION  Hepatitis B  If you have a higher risk for hepatitis B, you should be screened for this virus. You are considered at high risk for hepatitis B if:  You were born in a country where hepatitis B is common. Ask your health care provider which countries are considered high risk.  Your parents were born in a high-risk country, and you have not been immunized against hepatitis B (hepatitis B vaccine).  You have HIV or AIDS.  You use needles to inject street drugs.  You live with someone who has hepatitis B.  You have had sex with someone who has hepatitis B.  You get hemodialysis treatment.  You take certain medicines for conditions, including cancer, organ transplantation, and autoimmune conditions. Hepatitis C  Blood testing is recommended for:  Everyone born from [birth year] through 1963.  Anyone with known risk factors for hepatitis C. Sexually transmitted infections (STIs)  You should be screened for sexually transmitted infections (STIs) including gonorrhea and chlamydia if:  You are sexually active and are younger than 67 years of age.  You are older than 66 years of age and your health care provider tells you that you are at risk for this type of infection.  Your sexual activity has changed since you were last screened and you are at an increased risk for chlamydia or gonorrhea. Ask your health care provider if you are at risk.  If you do not have HIV, but are at risk, it may be recommended that you take a prescription medicine daily to prevent HIV infection. This is called pre-exposure prophylaxis (PrEP). You are considered at risk if:  You are sexually active and do not regularly use condoms or  know the HIV status of your partner(s).  You take drugs by injection.  You are sexually active with a partner who has HIV. Talk with your health care provider about whether you are at high risk of being infected with HIV. If you choose to begin PrEP, you should first be tested for HIV. You should then be  tested every 3 months for as long as you are taking PrEP.  PREGNANCY   If you are premenopausal and you may become pregnant, ask your health care provider about preconception counseling.  If you may become pregnant, take 400 to 800 micrograms (mcg) of folic acid every day.  If you want to prevent pregnancy, talk to your health care provider about birth control (contraception). OSTEOPOROSIS AND MENOPAUSE   Osteoporosis is a disease in which the bones lose minerals and strength with aging. This can result in serious bone fractures. Your risk for osteoporosis can be identified using a bone density scan.  If you are 25 years of age or older, or if you are at risk for osteoporosis and fractures, ask your health care provider if you should be screened.  Ask your health care provider whether you should take a calcium or vitamin D supplement to lower your risk for osteoporosis.  Menopause may have certain physical symptoms and risks.  Hormone replacement therapy may reduce some of these symptoms and risks. Talk to your health care provider about whether hormone replacement therapy is right for you.  HOME CARE INSTRUCTIONS   Schedule regular health, dental, and eye exams.  Stay current with your immunizations.   Do not use any tobacco products including cigarettes, chewing tobacco, or electronic cigarettes.  If you are pregnant, do not drink alcohol.  If you are breastfeeding, limit how much and how often you drink alcohol.  Limit alcohol intake to no more than 1 drink per day for nonpregnant women. One drink equals 12 ounces of beer, 5 ounces of wine, or 1 ounces of hard liquor.  Do  not use street drugs.  Do not share needles.  Ask your health care provider for help if you need support or information about quitting drugs.  Tell your health care provider if you often feel depressed.  Tell your health care provider if you have ever been abused or do not feel safe at home. Document Released: 11/22/2010 Document Revised: 09/23/2013 Document Reviewed: 04/10/2013 [facility] Patient Information 2015 [place], [place]. This information is not intended to replace advice given to you by your health care provider. Make sure you discuss any questions you have with your health care provider.

## 2015-03-24 DIAGNOSIS — M810 Age-related osteoporosis without current pathological fracture: Secondary | ICD-10-CM

## 2015-03-24 HISTORY — DX: Age-related osteoporosis without current pathological fracture: M81.0

## 2015-03-25 ENCOUNTER — Ambulatory Visit (INDEPENDENT_AMBULATORY_CARE_PROVIDER_SITE_OTHER): Payer: Managed Care, Other (non HMO) | Admitting: Internal Medicine

## 2015-03-25 VITALS — BP 102/74 | HR 72

## 2015-03-25 DIAGNOSIS — Z23 Encounter for immunization: Secondary | ICD-10-CM

## 2015-03-31 ENCOUNTER — Ambulatory Visit (INDEPENDENT_AMBULATORY_CARE_PROVIDER_SITE_OTHER): Payer: Managed Care, Other (non HMO)

## 2015-03-31 ENCOUNTER — Other Ambulatory Visit: Payer: Self-pay | Admitting: Gynecology

## 2015-03-31 ENCOUNTER — Encounter: Payer: Self-pay | Admitting: Gynecology

## 2015-03-31 ENCOUNTER — Telehealth: Payer: Self-pay | Admitting: Gynecology

## 2015-03-31 DIAGNOSIS — M810 Age-related osteoporosis without current pathological fracture: Secondary | ICD-10-CM | POA: Diagnosis not present

## 2015-03-31 DIAGNOSIS — M8580 Other specified disorders of bone density and structure, unspecified site: Secondary | ICD-10-CM

## 2015-03-31 NOTE — Telephone Encounter (Signed)
Tell patient that her bone density does show some osteoporosis in her left forearm. Her left hip and spine overall are stable. I'm not sure that I would recommend starting a medication for the wrist when the spine and hip look okay. My recommendation would be to monitor this and repeat in 2 years. If she would like to discuss this in more detail recommend office visit.

## 2015-03-31 NOTE — Telephone Encounter (Signed)
Left message for pt to call.

## 2015-04-01 NOTE — Telephone Encounter (Signed)
Pt informed with the below note. 

## 2015-04-28 IMAGING — NM NM HEPATO W/GB/PHARM/[PERSON_NAME]
3 series · 13 of 13 positions shown · non-contrast
Comparison: None.

CLINICAL DATA: Right upper quadrant pain.

EXAM:
NUCLEAR MEDICINE HEPATOBILIARY IMAGING WITH GALLBLADDER EF
TECHNIQUE: Sequential images of the abdomen were obtained [DATE] minutes
following intravenous administration of radiopharmaceutical. After
slow intravenous infusion of 1.3 micrograms Cholecystokinin,
gallbladder ejection fraction was determined.
RADIOPHARMACEUTICALS:  5.0 Millicurie Tc-IIm Choletec

[Series 1: biliary · 4.14mm/px · 6 of 60 frames shown]
[frame 6/60]
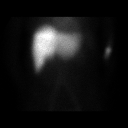
[frame 16/60]
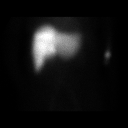
[frame 26/60]
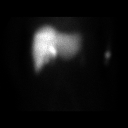
[frame 36/60]
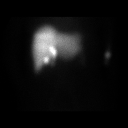
[frame 46/60]
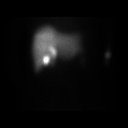
[frame 56/60]
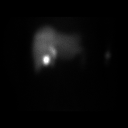

[Series 2: rt lat · 4.14mm/px · 1 of 1 slices shown]
[im 1/1]
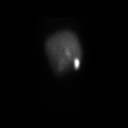

[Series 3: gbef · 4.14mm/px · 6 of 30 frames shown]
[frame 3/30]
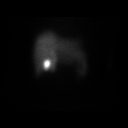
[frame 8/30]
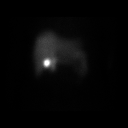
[frame 13/30]
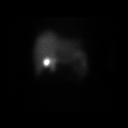
[frame 18/30]
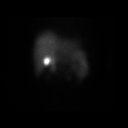
[frame 23/30]
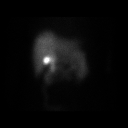
[frame 28/30]
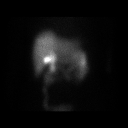

[13 of 13 positions shown; findings below may reference images not displayed]

FINDINGS: Liver, biliary system, gallbladder, bowel appear normal. Gallbladder
ejection fraction at 30 min is at 100%.. At 30 min, normal ejection
fraction is greater than 30%.

The patient did not experience symptoms during CCK infusion.
IMPRESSION: Normal exam.

## 2015-06-09 ENCOUNTER — Other Ambulatory Visit: Payer: Self-pay | Admitting: Internal Medicine

## 2015-09-11 DIAGNOSIS — J3081 Allergic rhinitis due to animal (cat) (dog) hair and dander: Secondary | ICD-10-CM | POA: Diagnosis not present

## 2015-09-11 DIAGNOSIS — J3089 Other allergic rhinitis: Secondary | ICD-10-CM | POA: Diagnosis not present

## 2015-09-11 DIAGNOSIS — J301 Allergic rhinitis due to pollen: Secondary | ICD-10-CM | POA: Diagnosis not present

## 2015-09-25 DIAGNOSIS — J3089 Other allergic rhinitis: Secondary | ICD-10-CM | POA: Diagnosis not present

## 2015-09-25 DIAGNOSIS — J3081 Allergic rhinitis due to animal (cat) (dog) hair and dander: Secondary | ICD-10-CM | POA: Diagnosis not present

## 2015-09-25 DIAGNOSIS — J301 Allergic rhinitis due to pollen: Secondary | ICD-10-CM | POA: Diagnosis not present

## 2015-10-09 DIAGNOSIS — J3089 Other allergic rhinitis: Secondary | ICD-10-CM | POA: Diagnosis not present

## 2015-10-09 DIAGNOSIS — J3081 Allergic rhinitis due to animal (cat) (dog) hair and dander: Secondary | ICD-10-CM | POA: Diagnosis not present

## 2015-10-09 DIAGNOSIS — J301 Allergic rhinitis due to pollen: Secondary | ICD-10-CM | POA: Diagnosis not present

## 2015-10-12 DIAGNOSIS — J3089 Other allergic rhinitis: Secondary | ICD-10-CM | POA: Diagnosis not present

## 2015-10-16 DIAGNOSIS — J3081 Allergic rhinitis due to animal (cat) (dog) hair and dander: Secondary | ICD-10-CM | POA: Diagnosis not present

## 2015-10-16 DIAGNOSIS — J3089 Other allergic rhinitis: Secondary | ICD-10-CM | POA: Diagnosis not present

## 2015-10-16 DIAGNOSIS — J301 Allergic rhinitis due to pollen: Secondary | ICD-10-CM | POA: Diagnosis not present

## 2015-10-20 DIAGNOSIS — J3089 Other allergic rhinitis: Secondary | ICD-10-CM | POA: Diagnosis not present

## 2015-10-20 DIAGNOSIS — J3081 Allergic rhinitis due to animal (cat) (dog) hair and dander: Secondary | ICD-10-CM | POA: Diagnosis not present

## 2015-10-20 DIAGNOSIS — J301 Allergic rhinitis due to pollen: Secondary | ICD-10-CM | POA: Diagnosis not present

## 2015-10-26 DIAGNOSIS — M4604 Spinal enthesopathy, thoracic region: Secondary | ICD-10-CM | POA: Diagnosis not present

## 2015-10-26 DIAGNOSIS — M9905 Segmental and somatic dysfunction of pelvic region: Secondary | ICD-10-CM | POA: Diagnosis not present

## 2015-10-26 DIAGNOSIS — M9902 Segmental and somatic dysfunction of thoracic region: Secondary | ICD-10-CM | POA: Diagnosis not present

## 2015-10-26 DIAGNOSIS — M4716 Other spondylosis with myelopathy, lumbar region: Secondary | ICD-10-CM | POA: Diagnosis not present

## 2015-10-26 DIAGNOSIS — M9903 Segmental and somatic dysfunction of lumbar region: Secondary | ICD-10-CM | POA: Diagnosis not present

## 2015-10-26 DIAGNOSIS — M5431 Sciatica, right side: Secondary | ICD-10-CM | POA: Diagnosis not present

## 2015-10-27 DIAGNOSIS — M4604 Spinal enthesopathy, thoracic region: Secondary | ICD-10-CM | POA: Diagnosis not present

## 2015-10-27 DIAGNOSIS — M4716 Other spondylosis with myelopathy, lumbar region: Secondary | ICD-10-CM | POA: Diagnosis not present

## 2015-10-27 DIAGNOSIS — M5431 Sciatica, right side: Secondary | ICD-10-CM | POA: Diagnosis not present

## 2015-10-27 DIAGNOSIS — M9905 Segmental and somatic dysfunction of pelvic region: Secondary | ICD-10-CM | POA: Diagnosis not present

## 2015-10-27 DIAGNOSIS — M9902 Segmental and somatic dysfunction of thoracic region: Secondary | ICD-10-CM | POA: Diagnosis not present

## 2015-10-27 DIAGNOSIS — M9903 Segmental and somatic dysfunction of lumbar region: Secondary | ICD-10-CM | POA: Diagnosis not present

## 2015-10-28 DIAGNOSIS — J3089 Other allergic rhinitis: Secondary | ICD-10-CM | POA: Diagnosis not present

## 2015-10-30 DIAGNOSIS — M5431 Sciatica, right side: Secondary | ICD-10-CM | POA: Diagnosis not present

## 2015-10-30 DIAGNOSIS — J3081 Allergic rhinitis due to animal (cat) (dog) hair and dander: Secondary | ICD-10-CM | POA: Diagnosis not present

## 2015-10-30 DIAGNOSIS — M9903 Segmental and somatic dysfunction of lumbar region: Secondary | ICD-10-CM | POA: Diagnosis not present

## 2015-10-30 DIAGNOSIS — M9902 Segmental and somatic dysfunction of thoracic region: Secondary | ICD-10-CM | POA: Diagnosis not present

## 2015-10-30 DIAGNOSIS — J3089 Other allergic rhinitis: Secondary | ICD-10-CM | POA: Diagnosis not present

## 2015-10-30 DIAGNOSIS — M4716 Other spondylosis with myelopathy, lumbar region: Secondary | ICD-10-CM | POA: Diagnosis not present

## 2015-10-30 DIAGNOSIS — J301 Allergic rhinitis due to pollen: Secondary | ICD-10-CM | POA: Diagnosis not present

## 2015-10-30 DIAGNOSIS — M9905 Segmental and somatic dysfunction of pelvic region: Secondary | ICD-10-CM | POA: Diagnosis not present

## 2015-10-30 DIAGNOSIS — M4604 Spinal enthesopathy, thoracic region: Secondary | ICD-10-CM | POA: Diagnosis not present

## 2015-11-02 DIAGNOSIS — M9903 Segmental and somatic dysfunction of lumbar region: Secondary | ICD-10-CM | POA: Diagnosis not present

## 2015-11-02 DIAGNOSIS — M4716 Other spondylosis with myelopathy, lumbar region: Secondary | ICD-10-CM | POA: Diagnosis not present

## 2015-11-02 DIAGNOSIS — M4604 Spinal enthesopathy, thoracic region: Secondary | ICD-10-CM | POA: Diagnosis not present

## 2015-11-02 DIAGNOSIS — M9902 Segmental and somatic dysfunction of thoracic region: Secondary | ICD-10-CM | POA: Diagnosis not present

## 2015-11-02 DIAGNOSIS — M9905 Segmental and somatic dysfunction of pelvic region: Secondary | ICD-10-CM | POA: Diagnosis not present

## 2015-11-02 DIAGNOSIS — M5431 Sciatica, right side: Secondary | ICD-10-CM | POA: Diagnosis not present

## 2015-11-03 DIAGNOSIS — M5431 Sciatica, right side: Secondary | ICD-10-CM | POA: Diagnosis not present

## 2015-11-03 DIAGNOSIS — M9903 Segmental and somatic dysfunction of lumbar region: Secondary | ICD-10-CM | POA: Diagnosis not present

## 2015-11-03 DIAGNOSIS — M9902 Segmental and somatic dysfunction of thoracic region: Secondary | ICD-10-CM | POA: Diagnosis not present

## 2015-11-03 DIAGNOSIS — M4604 Spinal enthesopathy, thoracic region: Secondary | ICD-10-CM | POA: Diagnosis not present

## 2015-11-03 DIAGNOSIS — M9905 Segmental and somatic dysfunction of pelvic region: Secondary | ICD-10-CM | POA: Diagnosis not present

## 2015-11-03 DIAGNOSIS — M4716 Other spondylosis with myelopathy, lumbar region: Secondary | ICD-10-CM | POA: Diagnosis not present

## 2015-11-04 DIAGNOSIS — J3081 Allergic rhinitis due to animal (cat) (dog) hair and dander: Secondary | ICD-10-CM | POA: Diagnosis not present

## 2015-11-04 DIAGNOSIS — J3089 Other allergic rhinitis: Secondary | ICD-10-CM | POA: Diagnosis not present

## 2015-11-04 DIAGNOSIS — J301 Allergic rhinitis due to pollen: Secondary | ICD-10-CM | POA: Diagnosis not present

## 2015-11-20 DIAGNOSIS — J3089 Other allergic rhinitis: Secondary | ICD-10-CM | POA: Diagnosis not present

## 2015-11-20 DIAGNOSIS — J301 Allergic rhinitis due to pollen: Secondary | ICD-10-CM | POA: Diagnosis not present

## 2015-11-20 DIAGNOSIS — J3081 Allergic rhinitis due to animal (cat) (dog) hair and dander: Secondary | ICD-10-CM | POA: Diagnosis not present

## 2015-12-04 DIAGNOSIS — J3081 Allergic rhinitis due to animal (cat) (dog) hair and dander: Secondary | ICD-10-CM | POA: Diagnosis not present

## 2015-12-04 DIAGNOSIS — J3089 Other allergic rhinitis: Secondary | ICD-10-CM | POA: Diagnosis not present

## 2015-12-04 DIAGNOSIS — J301 Allergic rhinitis due to pollen: Secondary | ICD-10-CM | POA: Diagnosis not present

## 2015-12-09 DIAGNOSIS — M4604 Spinal enthesopathy, thoracic region: Secondary | ICD-10-CM | POA: Diagnosis not present

## 2015-12-09 DIAGNOSIS — M9903 Segmental and somatic dysfunction of lumbar region: Secondary | ICD-10-CM | POA: Diagnosis not present

## 2015-12-09 DIAGNOSIS — M9902 Segmental and somatic dysfunction of thoracic region: Secondary | ICD-10-CM | POA: Diagnosis not present

## 2015-12-09 DIAGNOSIS — M4716 Other spondylosis with myelopathy, lumbar region: Secondary | ICD-10-CM | POA: Diagnosis not present

## 2015-12-09 DIAGNOSIS — M9905 Segmental and somatic dysfunction of pelvic region: Secondary | ICD-10-CM | POA: Diagnosis not present

## 2015-12-09 DIAGNOSIS — M5431 Sciatica, right side: Secondary | ICD-10-CM | POA: Diagnosis not present

## 2015-12-16 ENCOUNTER — Other Ambulatory Visit: Payer: Self-pay | Admitting: Internal Medicine

## 2015-12-18 DIAGNOSIS — J3081 Allergic rhinitis due to animal (cat) (dog) hair and dander: Secondary | ICD-10-CM | POA: Diagnosis not present

## 2015-12-18 DIAGNOSIS — J3089 Other allergic rhinitis: Secondary | ICD-10-CM | POA: Diagnosis not present

## 2015-12-18 DIAGNOSIS — J301 Allergic rhinitis due to pollen: Secondary | ICD-10-CM | POA: Diagnosis not present

## 2016-01-01 DIAGNOSIS — J3089 Other allergic rhinitis: Secondary | ICD-10-CM | POA: Diagnosis not present

## 2016-01-01 DIAGNOSIS — J3081 Allergic rhinitis due to animal (cat) (dog) hair and dander: Secondary | ICD-10-CM | POA: Diagnosis not present

## 2016-01-01 DIAGNOSIS — J301 Allergic rhinitis due to pollen: Secondary | ICD-10-CM | POA: Diagnosis not present

## 2016-01-15 DIAGNOSIS — J3081 Allergic rhinitis due to animal (cat) (dog) hair and dander: Secondary | ICD-10-CM | POA: Diagnosis not present

## 2016-01-15 DIAGNOSIS — J3089 Other allergic rhinitis: Secondary | ICD-10-CM | POA: Diagnosis not present

## 2016-01-15 DIAGNOSIS — J301 Allergic rhinitis due to pollen: Secondary | ICD-10-CM | POA: Diagnosis not present

## 2016-01-19 DIAGNOSIS — D239 Other benign neoplasm of skin, unspecified: Secondary | ICD-10-CM | POA: Diagnosis not present

## 2016-01-29 DIAGNOSIS — J3081 Allergic rhinitis due to animal (cat) (dog) hair and dander: Secondary | ICD-10-CM | POA: Diagnosis not present

## 2016-01-29 DIAGNOSIS — J301 Allergic rhinitis due to pollen: Secondary | ICD-10-CM | POA: Diagnosis not present

## 2016-01-29 DIAGNOSIS — J3089 Other allergic rhinitis: Secondary | ICD-10-CM | POA: Diagnosis not present

## 2016-02-12 DIAGNOSIS — J301 Allergic rhinitis due to pollen: Secondary | ICD-10-CM | POA: Diagnosis not present

## 2016-02-12 DIAGNOSIS — J3081 Allergic rhinitis due to animal (cat) (dog) hair and dander: Secondary | ICD-10-CM | POA: Diagnosis not present

## 2016-02-12 DIAGNOSIS — J3089 Other allergic rhinitis: Secondary | ICD-10-CM | POA: Diagnosis not present

## 2016-02-26 DIAGNOSIS — J3081 Allergic rhinitis due to animal (cat) (dog) hair and dander: Secondary | ICD-10-CM | POA: Diagnosis not present

## 2016-02-26 DIAGNOSIS — J3089 Other allergic rhinitis: Secondary | ICD-10-CM | POA: Diagnosis not present

## 2016-02-26 DIAGNOSIS — J301 Allergic rhinitis due to pollen: Secondary | ICD-10-CM | POA: Diagnosis not present

## 2016-02-29 DIAGNOSIS — J3081 Allergic rhinitis due to animal (cat) (dog) hair and dander: Secondary | ICD-10-CM | POA: Diagnosis not present

## 2016-02-29 DIAGNOSIS — J301 Allergic rhinitis due to pollen: Secondary | ICD-10-CM | POA: Diagnosis not present

## 2016-03-08 ENCOUNTER — Other Ambulatory Visit: Payer: Managed Care, Other (non HMO) | Admitting: Internal Medicine

## 2016-03-10 ENCOUNTER — Encounter: Payer: Managed Care, Other (non HMO) | Admitting: Internal Medicine

## 2016-03-11 DIAGNOSIS — J301 Allergic rhinitis due to pollen: Secondary | ICD-10-CM | POA: Diagnosis not present

## 2016-03-11 DIAGNOSIS — J3089 Other allergic rhinitis: Secondary | ICD-10-CM | POA: Diagnosis not present

## 2016-03-11 DIAGNOSIS — J3081 Allergic rhinitis due to animal (cat) (dog) hair and dander: Secondary | ICD-10-CM | POA: Diagnosis not present

## 2016-03-13 ENCOUNTER — Other Ambulatory Visit: Payer: Self-pay | Admitting: Internal Medicine

## 2016-03-14 ENCOUNTER — Telehealth: Payer: Self-pay | Admitting: *Deleted

## 2016-03-14 NOTE — Telephone Encounter (Signed)
Pt past due for CPE please call her

## 2016-03-14 NOTE — Telephone Encounter (Signed)
She has one scheduled for December.

## 2016-03-14 NOTE — Telephone Encounter (Signed)
Pharmacy called requesting refill on Pts Synthroid

## 2016-03-23 DIAGNOSIS — M791 Myalgia: Secondary | ICD-10-CM | POA: Diagnosis not present

## 2016-03-23 DIAGNOSIS — M9907 Segmental and somatic dysfunction of upper extremity: Secondary | ICD-10-CM | POA: Diagnosis not present

## 2016-03-23 DIAGNOSIS — M9902 Segmental and somatic dysfunction of thoracic region: Secondary | ICD-10-CM | POA: Diagnosis not present

## 2016-03-23 DIAGNOSIS — M9901 Segmental and somatic dysfunction of cervical region: Secondary | ICD-10-CM | POA: Diagnosis not present

## 2016-03-24 ENCOUNTER — Ambulatory Visit (INDEPENDENT_AMBULATORY_CARE_PROVIDER_SITE_OTHER): Payer: Medicare Other | Admitting: Internal Medicine

## 2016-03-24 DIAGNOSIS — Z23 Encounter for immunization: Secondary | ICD-10-CM | POA: Diagnosis not present

## 2016-03-25 DIAGNOSIS — J3081 Allergic rhinitis due to animal (cat) (dog) hair and dander: Secondary | ICD-10-CM | POA: Diagnosis not present

## 2016-03-25 DIAGNOSIS — J3089 Other allergic rhinitis: Secondary | ICD-10-CM | POA: Diagnosis not present

## 2016-03-25 DIAGNOSIS — J301 Allergic rhinitis due to pollen: Secondary | ICD-10-CM | POA: Diagnosis not present

## 2016-03-28 DIAGNOSIS — J301 Allergic rhinitis due to pollen: Secondary | ICD-10-CM | POA: Diagnosis not present

## 2016-03-28 DIAGNOSIS — M9907 Segmental and somatic dysfunction of upper extremity: Secondary | ICD-10-CM | POA: Diagnosis not present

## 2016-03-28 DIAGNOSIS — J3081 Allergic rhinitis due to animal (cat) (dog) hair and dander: Secondary | ICD-10-CM | POA: Diagnosis not present

## 2016-03-28 DIAGNOSIS — M791 Myalgia: Secondary | ICD-10-CM | POA: Diagnosis not present

## 2016-03-28 DIAGNOSIS — M9902 Segmental and somatic dysfunction of thoracic region: Secondary | ICD-10-CM | POA: Diagnosis not present

## 2016-03-28 DIAGNOSIS — J3089 Other allergic rhinitis: Secondary | ICD-10-CM | POA: Diagnosis not present

## 2016-03-28 DIAGNOSIS — M9901 Segmental and somatic dysfunction of cervical region: Secondary | ICD-10-CM | POA: Diagnosis not present

## 2016-03-31 DIAGNOSIS — M9902 Segmental and somatic dysfunction of thoracic region: Secondary | ICD-10-CM | POA: Diagnosis not present

## 2016-03-31 DIAGNOSIS — M9901 Segmental and somatic dysfunction of cervical region: Secondary | ICD-10-CM | POA: Diagnosis not present

## 2016-03-31 DIAGNOSIS — M9907 Segmental and somatic dysfunction of upper extremity: Secondary | ICD-10-CM | POA: Diagnosis not present

## 2016-03-31 DIAGNOSIS — M791 Myalgia: Secondary | ICD-10-CM | POA: Diagnosis not present

## 2016-04-01 DIAGNOSIS — J301 Allergic rhinitis due to pollen: Secondary | ICD-10-CM | POA: Diagnosis not present

## 2016-04-01 DIAGNOSIS — J3081 Allergic rhinitis due to animal (cat) (dog) hair and dander: Secondary | ICD-10-CM | POA: Diagnosis not present

## 2016-04-01 DIAGNOSIS — H40013 Open angle with borderline findings, low risk, bilateral: Secondary | ICD-10-CM | POA: Diagnosis not present

## 2016-04-01 DIAGNOSIS — H2513 Age-related nuclear cataract, bilateral: Secondary | ICD-10-CM | POA: Diagnosis not present

## 2016-04-08 DIAGNOSIS — J3081 Allergic rhinitis due to animal (cat) (dog) hair and dander: Secondary | ICD-10-CM | POA: Diagnosis not present

## 2016-04-08 DIAGNOSIS — J3089 Other allergic rhinitis: Secondary | ICD-10-CM | POA: Diagnosis not present

## 2016-04-08 DIAGNOSIS — J301 Allergic rhinitis due to pollen: Secondary | ICD-10-CM | POA: Diagnosis not present

## 2016-04-11 DIAGNOSIS — J3089 Other allergic rhinitis: Secondary | ICD-10-CM | POA: Diagnosis not present

## 2016-04-22 ENCOUNTER — Other Ambulatory Visit: Payer: Managed Care, Other (non HMO) | Admitting: Internal Medicine

## 2016-04-22 DIAGNOSIS — J3081 Allergic rhinitis due to animal (cat) (dog) hair and dander: Secondary | ICD-10-CM | POA: Diagnosis not present

## 2016-04-22 DIAGNOSIS — J301 Allergic rhinitis due to pollen: Secondary | ICD-10-CM | POA: Diagnosis not present

## 2016-04-22 DIAGNOSIS — J3089 Other allergic rhinitis: Secondary | ICD-10-CM | POA: Diagnosis not present

## 2016-04-25 ENCOUNTER — Other Ambulatory Visit (HOSPITAL_COMMUNITY)
Admission: RE | Admit: 2016-04-25 | Discharge: 2016-04-25 | Disposition: A | Discharge status: Home / Self Care | Payer: Medicare Other | Source: Ambulatory Visit | Attending: Internal Medicine | Admitting: Internal Medicine

## 2016-04-25 ENCOUNTER — Other Ambulatory Visit: Payer: Medicare Other | Admitting: Internal Medicine

## 2016-04-25 ENCOUNTER — Ambulatory Visit (INDEPENDENT_AMBULATORY_CARE_PROVIDER_SITE_OTHER): Payer: Medicare Other | Admitting: Internal Medicine

## 2016-04-25 ENCOUNTER — Encounter: Payer: Self-pay | Admitting: Internal Medicine

## 2016-04-25 VITALS — BP 128/80 | HR 55 | Temp 98.3°F | Ht 68.0 in | Wt 140.0 lb

## 2016-04-25 DIAGNOSIS — Z130 Encounter for screening for diseases of the blood and blood-forming organs and certain disorders involving the immune mechanism: Secondary | ICD-10-CM

## 2016-04-25 DIAGNOSIS — Z8719 Personal history of other diseases of the digestive system: Secondary | ICD-10-CM | POA: Diagnosis not present

## 2016-04-25 DIAGNOSIS — Z86010 Personal history of colonic polyps: Secondary | ICD-10-CM

## 2016-04-25 DIAGNOSIS — E039 Hypothyroidism, unspecified: Secondary | ICD-10-CM

## 2016-04-25 DIAGNOSIS — Z8739 Personal history of other diseases of the musculoskeletal system and connective tissue: Secondary | ICD-10-CM

## 2016-04-25 DIAGNOSIS — E7849 Other hyperlipidemia: Secondary | ICD-10-CM

## 2016-04-25 DIAGNOSIS — J45990 Exercise induced bronchospasm: Secondary | ICD-10-CM | POA: Diagnosis not present

## 2016-04-25 DIAGNOSIS — Z0000 Encounter for general adult medical examination without abnormal findings: Secondary | ICD-10-CM

## 2016-04-25 DIAGNOSIS — Z789 Other specified health status: Secondary | ICD-10-CM | POA: Diagnosis not present

## 2016-04-25 DIAGNOSIS — M7918 Myalgia, other site: Secondary | ICD-10-CM

## 2016-04-25 DIAGNOSIS — Z1321 Encounter for screening for nutritional disorder: Secondary | ICD-10-CM | POA: Diagnosis not present

## 2016-04-25 DIAGNOSIS — F339 Major depressive disorder, recurrent, unspecified: Secondary | ICD-10-CM

## 2016-04-25 DIAGNOSIS — Z124 Encounter for screening for malignant neoplasm of cervix: Secondary | ICD-10-CM | POA: Diagnosis not present

## 2016-04-25 DIAGNOSIS — J3089 Other allergic rhinitis: Secondary | ICD-10-CM

## 2016-04-25 DIAGNOSIS — F338 Other recurrent depressive disorders: Secondary | ICD-10-CM

## 2016-04-25 DIAGNOSIS — M791 Myalgia: Secondary | ICD-10-CM | POA: Diagnosis not present

## 2016-04-25 DIAGNOSIS — E784 Other hyperlipidemia: Secondary | ICD-10-CM | POA: Diagnosis not present

## 2016-04-25 DIAGNOSIS — Z13220 Encounter for screening for lipoid disorders: Secondary | ICD-10-CM

## 2016-04-25 DIAGNOSIS — K5900 Constipation, unspecified: Secondary | ICD-10-CM | POA: Diagnosis not present

## 2016-04-25 LAB — POCT URINALYSIS DIPSTICK
Bilirubin, UA: NEGATIVE
Blood, UA: NEGATIVE
Glucose, UA: NEGATIVE
Ketones, UA: NEGATIVE
LEUKOCYTES UA: NEGATIVE
Nitrite, UA: NEGATIVE
PROTEIN UA: NEGATIVE
Spec Grav, UA: <=1.005
UROBILINOGEN UA: NEGATIVE
pH, UA: 7.5

## 2016-04-25 LAB — CBC WITH DIFFERENTIAL/PLATELET
BASOS PCT: 1 %
Basophils Absolute: 49 cells/uL (ref 0–200)
EOS ABS: 245 {cells}/uL (ref 15–500)
EOS PCT: 5 %
HCT: 41 % (ref 35.0–45.0)
Hemoglobin: 13.7 g/dL (ref 11.7–15.5)
Lymphocytes Relative: 21 %
Lymphs Abs: 1029 cells/uL (ref 850–3900)
MCH: 30.4 pg (ref 27.0–33.0)
MCHC: 33.4 g/dL (ref 32.0–36.0)
MCV: 90.9 fL (ref 80.0–100.0)
MONOS PCT: 9 %
MPV: 8.8 fL (ref 7.5–12.5)
Monocytes Absolute: 441 cells/uL (ref 200–950)
NEUTROS ABS: 3136 {cells}/uL (ref 1500–7800)
Neutrophils Relative %: 64 %
PLATELETS: 283 10*3/uL (ref 140–400)
RBC: 4.51 MIL/uL (ref 3.80–5.10)
RDW: 13.4 % (ref 11.0–15.0)
WBC: 4.9 10*3/uL (ref 3.8–10.8)

## 2016-04-25 LAB — COMPLETE METABOLIC PANEL WITH GFR
ALT: 16 U/L (ref 6–29)
AST: 30 U/L (ref 10–35)
Albumin: 4.2 g/dL (ref 3.6–5.1)
Alkaline Phosphatase: 56 U/L (ref 33–130)
BILIRUBIN TOTAL: 0.5 mg/dL (ref 0.2–1.2)
BUN: 13 mg/dL (ref 7–25)
CO2: 26 mmol/L (ref 20–31)
CREATININE: 0.97 mg/dL (ref 0.50–0.99)
Calcium: 9.2 mg/dL (ref 8.6–10.4)
Chloride: 105 mmol/L (ref 98–110)
GFR, Est African American: 69 mL/min (ref >=60)
GFR, Est Non African American: 60 mL/min (ref >=60)
GLUCOSE: 86 mg/dL (ref 65–99)
Potassium: 4.3 mmol/L (ref 3.5–5.3)
SODIUM: 140 mmol/L (ref 135–146)
TOTAL PROTEIN: 6.2 g/dL (ref 6.1–8.1)

## 2016-04-25 LAB — LIPID PANEL
CHOLESTEROL: 201 mg/dL — AB (ref <200)
HDL: 96 mg/dL (ref >50)
LDL Cholesterol: 96 mg/dL (ref <100)
Total CHOL/HDL Ratio: 2.1 Ratio (ref <5.0)
Triglycerides: 47 mg/dL (ref <150)
VLDL: 9 mg/dL (ref <30)

## 2016-04-25 LAB — TSH: TSH: 4.52 mIU/L — ABNORMAL HIGH

## 2016-04-26 ENCOUNTER — Telehealth: Payer: Self-pay | Admitting: Internal Medicine

## 2016-04-26 LAB — VITAMIN D 25 HYDROXY (VIT D DEFICIENCY, FRACTURES): Vit D, 25-Hydroxy: 46 ng/mL (ref 30–100)

## 2016-04-26 NOTE — Telephone Encounter (Signed)
Per Dr Renold Genta patients TSH is a little abnormal and she wants it rechecked in 8 weeks.  Patient aware and appointment made.  Labs mailed to patient.

## 2016-04-27 DIAGNOSIS — J301 Allergic rhinitis due to pollen: Secondary | ICD-10-CM | POA: Diagnosis not present

## 2016-04-27 DIAGNOSIS — J3081 Allergic rhinitis due to animal (cat) (dog) hair and dander: Secondary | ICD-10-CM | POA: Diagnosis not present

## 2016-04-27 DIAGNOSIS — K5900 Constipation, unspecified: Secondary | ICD-10-CM | POA: Diagnosis not present

## 2016-04-27 LAB — CYTOLOGY - PAP: Diagnosis: NEGATIVE

## 2016-05-06 DIAGNOSIS — J3081 Allergic rhinitis due to animal (cat) (dog) hair and dander: Secondary | ICD-10-CM | POA: Diagnosis not present

## 2016-05-06 DIAGNOSIS — J3089 Other allergic rhinitis: Secondary | ICD-10-CM | POA: Diagnosis not present

## 2016-05-06 DIAGNOSIS — J301 Allergic rhinitis due to pollen: Secondary | ICD-10-CM | POA: Diagnosis not present

## 2016-05-20 DIAGNOSIS — J3081 Allergic rhinitis due to animal (cat) (dog) hair and dander: Secondary | ICD-10-CM | POA: Diagnosis not present

## 2016-05-20 DIAGNOSIS — J301 Allergic rhinitis due to pollen: Secondary | ICD-10-CM | POA: Diagnosis not present

## 2016-05-20 DIAGNOSIS — J3089 Other allergic rhinitis: Secondary | ICD-10-CM | POA: Diagnosis not present

## 2016-05-22 NOTE — Patient Instructions (Signed)
It was pleasure to see today. Congratulations on her retirement. Return in one year or as needed.

## 2016-05-22 NOTE — Progress Notes (Signed)
Subjective:    Patient ID: Chelsea Cantrell, female    DOB: August 07, 1945, 70 y.o.   MRN: [id number]  HPI 70 year old Female in today for Welcome to Medicare physical examination and evaluation of medical issues. She has a history of recurrent pancreatitis which is problematic for most of the year in 2016. She had an episode in September 2016. Lipase was 79. In June 2016 she had a stent placed bile/pancreatic duct at [facility]. She had sphincterotomy as well. This helped control recurrent episodes. Subsequently stent was removed by Dr. met all. [place].  History of hypothyroidism, GE reflux, exercise-induced asthma, Schotz Keys ring, allergic rhinitis, hyperplastic colon polyps, estrogen replacement, constipation, history of spinal stenosis, seasonal effective disorder.  No known drug allergies. Intolerant of generic Wellbutrin.  Has been evaluated at [place] and takes allergy injections.  Allergic to cats, dogs, and pollen.  Was tried on Strattera in 2003 for attention deficit disorder but currently does not take any medicine for attention deficit.  Has seen Dr. 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 Has also tried  Amitiza for chronic constipation but currently is on Linzess.  Takes Celebrex and chondroitin sulfate for musculoskeletal pain.  Social history: Single, never married, no children. Just retired as an Programme researcher, broadcasting/film/video for [employer]. She exercises routinely. She did smoke prior to 1976 but currently nonsmoker. Does not drink coffee. Is a vegetarian. Tricks wine sometimes but I asked her to cut back since episodes of pancreatitis.  Family history: Father died at 30 of lung cancer with history of MI at 60 and hyperlipidemia. Mother died at age 104 but had a stroke 4 years before she passed away. One brother and one sister in good  health.      Review of Systems  Constitutional: Negative.   All other systems reviewed and are negative.      Objective:   Physical Exam  Constitutional: She is oriented to person, place, and time. She appears well-developed and well-nourished. No distress.  HENT:  Head: Normocephalic and atraumatic.  Right Ear: External ear normal.  Left Ear: External ear normal.  Mouth/Throat: Oropharynx is clear and moist.  Eyes: Conjunctivae and EOM are normal. Pupils are equal, round, and reactive to light. Right eye exhibits no discharge. Left eye exhibits no discharge. No scleral icterus.  Neck: Neck supple. No JVD present. No thyromegaly present.  Cardiovascular: Normal rate, regular rhythm, normal heart sounds and intact distal pulses.   No murmur heard. Pulmonary/Chest: Effort normal and breath sounds normal. No respiratory distress. She has no wheezes. She has no rales.  Abdominal: Soft. Bowel sounds are normal. She exhibits no distension and no mass. There is no tenderness. There is no rebound and no guarding.  Genitourinary:  Genitourinary Comments: Deferred to GYN  Musculoskeletal: She exhibits no edema.  Lymphadenopathy:    She has no cervical adenopathy.  Neurological: She is alert and oriented to person, place, and time. She has normal reflexes. No cranial nerve deficit. Coordination normal.  Skin: Skin is warm and dry. No rash noted. She is not diaphoretic.  Psychiatric: She has a normal mood and affect. Her behavior is normal. Judgment and thought content normal.  Vitals reviewed.         Assessment & Plan:    History of recurrent bouts of pancreatitis treated with stent in bile/pancreatic duct with success with  no further episodes after multiple episodes in 2016  Chronic constipation  Allergic rhinitis  Hypothyroidism  Hyperplastic colon polyps  Hyperlipidemia  History of spinal stenosis  History of Schatzki's ring 2010  Vegetarian diet  History of hip  resurfacing 2001  Seasonal effective disorder  Musculoskeletal pain and osteoarthritis  TSH is elevated at 4.52. This could be an error and I suggest that she have repeat TSH without office visit in late January. Remainder of lab work is within normal limits.  Subjective:   Patient presents for Medicare Annual/Subsequent preventive examination.  Review Past Medical/Family/Social:See above   Risk Factors  Current exercise habits:Exercises regularly  Dietary issues discussed: Is a vegetarian  Cardiac risk factors:Family history in father. Stroke in mother.  Depression Screen  (Note: if answer to either of the following is "Yes", a more complete depression screening is indicated)   Over the past two weeks, have you felt down, depressed or hopeless? No  Over the past two weeks, have you felt little interest or pleasure in doing things? No Have you lost interest or pleasure in daily life? No Do you often feel hopeless? No Do you cry easily over simple problems? No   Activities of Daily Living  In your present state of health, do you have any difficulty performing the following activities?:   Driving? No  Managing money? No  Feeding yourself? No  Getting from bed to chair? No  Climbing a flight of stairs? No  Preparing food and eating?: No  Bathing or showering? No  Getting dressed: No  Getting to the toilet? No  Using the toilet:No  Moving around from place to place: No  In the past year have you fallen or had a near fall?:No  Are you sexually active? No  Do you have more than one partner? No   Hearing Difficulties: No  Do you often ask people to speak up or repeat themselves? Yes Do you experience ringing or noises in your ears? Yes Do you have difficulty understanding soft or whispered voices? Yes Do you feel that you have a problem with memory? No Do you often misplace items? No    Home Safety:  Do you have a smoke alarm at your residence? Yes Do you have grab  bars in the bathroom?No Do you have throw rugs in your house? Yes   Cognitive Testing  Alert? Yes Normal Appearance?Yes  Oriented to person? Yes Place? Yes  Time? Yes  Recall of three objects? Yes  Can perform simple calculations? Yes  Displays appropriate judgment?Yes  Can read the correct time from a watch face?Yes   List the Names of Other Physician/Practitioners you currently use:  See referral list for the physicians patient is currently seeing.     Review of Systems: See above   Objective:     General appearance: Appears Younger than stated age Head: Normocephalic, without obvious abnormality, atraumatic  Eyes: conj clear, EOMi PEERLA  Ears: normal TM's and external ear canals both ears  Nose: Nares normal. Septum midline. Mucosa normal. No drainage or sinus tenderness.  Throat: lips, mucosa, and tongue normal; teeth and gums normal  Neck: no adenopathy, no carotid bruit, no JVD, supple, symmetrical, trachea midline and thyroid not enlarged, symmetric, no tenderness/mass/nodules  No CVA tenderness.  Lungs: clear to auscultation bilaterally  Breasts: normal appearance, no masses or tenderness, .  Heart: regular rate and rhythm, S1, S2 normal, no murmur, click, rub or gallop  Abdomen: soft, non-tender; bowel sounds normal; no  masses, no organomegaly  Musculoskeletal: ROM normal in all joints, no crepitus, no deformity, Normal muscle strengthen. Back  is symmetric, no curvature. Skin: Skin color, texture, turgor normal. No rashes or lesions  Lymph nodes: Cervical, supraclavicular, and axillary nodes normal.  Neurologic: CN 2 -12 Normal, Normal symmetric reflexes. Normal coordination and gait  Psych: Alert & Oriented x 3, Mood appear stable.    Assessment:    Annual wellness medicare exam   Plan:    During the course of the visit the patient was educated and counseled about appropriate screening and preventive services including:   Annual flu vaccine  Annual  mammogram     Patient Instructions (the written plan) was given to the patient.  Medicare Attestation  I have personally reviewed:  The patient's medical and social history  Their use of alcohol, tobacco or illicit drugs  Their current medications and supplements  The patient's functional ability including ADLs,fall risks, home safety risks, cognitive, and hearing and visual impairment  Diet and physical activities  Evidence for depression or mood disorders  The patient's weight, height, BMI, and visual acuity have been recorded in the chart. I have made referrals, counseling, and provided education to the patient based on review of the above and I have provided the patient with a written personalized care plan for preventive services.

## 2016-06-03 DIAGNOSIS — J3089 Other allergic rhinitis: Secondary | ICD-10-CM | POA: Diagnosis not present

## 2016-06-03 DIAGNOSIS — J3081 Allergic rhinitis due to animal (cat) (dog) hair and dander: Secondary | ICD-10-CM | POA: Diagnosis not present

## 2016-06-03 DIAGNOSIS — J301 Allergic rhinitis due to pollen: Secondary | ICD-10-CM | POA: Diagnosis not present

## 2016-06-17 DIAGNOSIS — J3089 Other allergic rhinitis: Secondary | ICD-10-CM | POA: Diagnosis not present

## 2016-06-17 DIAGNOSIS — J301 Allergic rhinitis due to pollen: Secondary | ICD-10-CM | POA: Diagnosis not present

## 2016-06-17 DIAGNOSIS — J3081 Allergic rhinitis due to animal (cat) (dog) hair and dander: Secondary | ICD-10-CM | POA: Diagnosis not present

## 2016-06-20 ENCOUNTER — Other Ambulatory Visit: Payer: Medicare Other | Admitting: Internal Medicine

## 2016-06-20 DIAGNOSIS — R946 Abnormal results of thyroid function studies: Secondary | ICD-10-CM | POA: Diagnosis not present

## 2016-06-20 DIAGNOSIS — R7989 Other specified abnormal findings of blood chemistry: Secondary | ICD-10-CM

## 2016-06-20 LAB — TSH: TSH: 2.59 mIU/L

## 2016-06-21 ENCOUNTER — Other Ambulatory Visit: Payer: Self-pay | Admitting: Internal Medicine

## 2016-06-24 DIAGNOSIS — J3089 Other allergic rhinitis: Secondary | ICD-10-CM | POA: Diagnosis not present

## 2016-06-24 DIAGNOSIS — J3081 Allergic rhinitis due to animal (cat) (dog) hair and dander: Secondary | ICD-10-CM | POA: Diagnosis not present

## 2016-06-24 DIAGNOSIS — J301 Allergic rhinitis due to pollen: Secondary | ICD-10-CM | POA: Diagnosis not present

## 2016-07-01 DIAGNOSIS — J3081 Allergic rhinitis due to animal (cat) (dog) hair and dander: Secondary | ICD-10-CM | POA: Diagnosis not present

## 2016-07-01 DIAGNOSIS — J3089 Other allergic rhinitis: Secondary | ICD-10-CM | POA: Diagnosis not present

## 2016-07-01 DIAGNOSIS — J301 Allergic rhinitis due to pollen: Secondary | ICD-10-CM | POA: Diagnosis not present

## 2016-07-08 DIAGNOSIS — J3089 Other allergic rhinitis: Secondary | ICD-10-CM | POA: Diagnosis not present

## 2016-07-12 DIAGNOSIS — J3081 Allergic rhinitis due to animal (cat) (dog) hair and dander: Secondary | ICD-10-CM | POA: Diagnosis not present

## 2016-07-12 DIAGNOSIS — J301 Allergic rhinitis due to pollen: Secondary | ICD-10-CM | POA: Diagnosis not present

## 2016-07-12 DIAGNOSIS — J3089 Other allergic rhinitis: Secondary | ICD-10-CM | POA: Diagnosis not present

## 2016-07-12 DIAGNOSIS — J4520 Mild intermittent asthma, uncomplicated: Secondary | ICD-10-CM | POA: Diagnosis not present

## 2016-07-18 DIAGNOSIS — M791 Myalgia: Secondary | ICD-10-CM | POA: Diagnosis not present

## 2016-07-18 DIAGNOSIS — M9901 Segmental and somatic dysfunction of cervical region: Secondary | ICD-10-CM | POA: Diagnosis not present

## 2016-07-18 DIAGNOSIS — M9902 Segmental and somatic dysfunction of thoracic region: Secondary | ICD-10-CM | POA: Diagnosis not present

## 2016-07-18 DIAGNOSIS — M9907 Segmental and somatic dysfunction of upper extremity: Secondary | ICD-10-CM | POA: Diagnosis not present

## 2016-07-20 DIAGNOSIS — M791 Myalgia: Secondary | ICD-10-CM | POA: Diagnosis not present

## 2016-07-20 DIAGNOSIS — M9901 Segmental and somatic dysfunction of cervical region: Secondary | ICD-10-CM | POA: Diagnosis not present

## 2016-07-20 DIAGNOSIS — M9907 Segmental and somatic dysfunction of upper extremity: Secondary | ICD-10-CM | POA: Diagnosis not present

## 2016-07-20 DIAGNOSIS — M9902 Segmental and somatic dysfunction of thoracic region: Secondary | ICD-10-CM | POA: Diagnosis not present

## 2016-07-26 DIAGNOSIS — M791 Myalgia: Secondary | ICD-10-CM | POA: Diagnosis not present

## 2016-07-26 DIAGNOSIS — M9907 Segmental and somatic dysfunction of upper extremity: Secondary | ICD-10-CM | POA: Diagnosis not present

## 2016-07-26 DIAGNOSIS — M9902 Segmental and somatic dysfunction of thoracic region: Secondary | ICD-10-CM | POA: Diagnosis not present

## 2016-07-26 DIAGNOSIS — M9901 Segmental and somatic dysfunction of cervical region: Secondary | ICD-10-CM | POA: Diagnosis not present

## 2016-07-28 DIAGNOSIS — M791 Myalgia: Secondary | ICD-10-CM | POA: Diagnosis not present

## 2016-07-28 DIAGNOSIS — M9902 Segmental and somatic dysfunction of thoracic region: Secondary | ICD-10-CM | POA: Diagnosis not present

## 2016-07-28 DIAGNOSIS — M9907 Segmental and somatic dysfunction of upper extremity: Secondary | ICD-10-CM | POA: Diagnosis not present

## 2016-07-28 DIAGNOSIS — M9901 Segmental and somatic dysfunction of cervical region: Secondary | ICD-10-CM | POA: Diagnosis not present

## 2016-07-29 DIAGNOSIS — J3081 Allergic rhinitis due to animal (cat) (dog) hair and dander: Secondary | ICD-10-CM | POA: Diagnosis not present

## 2016-07-29 DIAGNOSIS — J301 Allergic rhinitis due to pollen: Secondary | ICD-10-CM | POA: Diagnosis not present

## 2016-07-29 DIAGNOSIS — J3089 Other allergic rhinitis: Secondary | ICD-10-CM | POA: Diagnosis not present

## 2016-08-12 DIAGNOSIS — J3089 Other allergic rhinitis: Secondary | ICD-10-CM | POA: Diagnosis not present

## 2016-08-12 DIAGNOSIS — J3081 Allergic rhinitis due to animal (cat) (dog) hair and dander: Secondary | ICD-10-CM | POA: Diagnosis not present

## 2016-08-12 DIAGNOSIS — J301 Allergic rhinitis due to pollen: Secondary | ICD-10-CM | POA: Diagnosis not present

## 2016-08-17 DIAGNOSIS — M9907 Segmental and somatic dysfunction of upper extremity: Secondary | ICD-10-CM | POA: Diagnosis not present

## 2016-08-17 DIAGNOSIS — M791 Myalgia: Secondary | ICD-10-CM | POA: Diagnosis not present

## 2016-08-17 DIAGNOSIS — M9902 Segmental and somatic dysfunction of thoracic region: Secondary | ICD-10-CM | POA: Diagnosis not present

## 2016-08-17 DIAGNOSIS — M9901 Segmental and somatic dysfunction of cervical region: Secondary | ICD-10-CM | POA: Diagnosis not present

## 2016-08-18 DIAGNOSIS — M9901 Segmental and somatic dysfunction of cervical region: Secondary | ICD-10-CM | POA: Diagnosis not present

## 2016-08-18 DIAGNOSIS — M791 Myalgia: Secondary | ICD-10-CM | POA: Diagnosis not present

## 2016-08-18 DIAGNOSIS — M9907 Segmental and somatic dysfunction of upper extremity: Secondary | ICD-10-CM | POA: Diagnosis not present

## 2016-08-18 DIAGNOSIS — M9902 Segmental and somatic dysfunction of thoracic region: Secondary | ICD-10-CM | POA: Diagnosis not present

## 2016-08-22 DIAGNOSIS — M9902 Segmental and somatic dysfunction of thoracic region: Secondary | ICD-10-CM | POA: Diagnosis not present

## 2016-08-22 DIAGNOSIS — M9901 Segmental and somatic dysfunction of cervical region: Secondary | ICD-10-CM | POA: Diagnosis not present

## 2016-08-22 DIAGNOSIS — M791 Myalgia: Secondary | ICD-10-CM | POA: Diagnosis not present

## 2016-08-22 DIAGNOSIS — M9907 Segmental and somatic dysfunction of upper extremity: Secondary | ICD-10-CM | POA: Diagnosis not present

## 2016-08-25 DIAGNOSIS — M9902 Segmental and somatic dysfunction of thoracic region: Secondary | ICD-10-CM | POA: Diagnosis not present

## 2016-08-25 DIAGNOSIS — M791 Myalgia: Secondary | ICD-10-CM | POA: Diagnosis not present

## 2016-08-25 DIAGNOSIS — M9901 Segmental and somatic dysfunction of cervical region: Secondary | ICD-10-CM | POA: Diagnosis not present

## 2016-08-25 DIAGNOSIS — M9907 Segmental and somatic dysfunction of upper extremity: Secondary | ICD-10-CM | POA: Diagnosis not present

## 2016-08-26 DIAGNOSIS — J3081 Allergic rhinitis due to animal (cat) (dog) hair and dander: Secondary | ICD-10-CM | POA: Diagnosis not present

## 2016-08-26 DIAGNOSIS — J3089 Other allergic rhinitis: Secondary | ICD-10-CM | POA: Diagnosis not present

## 2016-08-26 DIAGNOSIS — J301 Allergic rhinitis due to pollen: Secondary | ICD-10-CM | POA: Diagnosis not present

## 2016-08-29 DIAGNOSIS — M791 Myalgia: Secondary | ICD-10-CM | POA: Diagnosis not present

## 2016-08-29 DIAGNOSIS — M9902 Segmental and somatic dysfunction of thoracic region: Secondary | ICD-10-CM | POA: Diagnosis not present

## 2016-08-29 DIAGNOSIS — M9907 Segmental and somatic dysfunction of upper extremity: Secondary | ICD-10-CM | POA: Diagnosis not present

## 2016-08-29 DIAGNOSIS — M9901 Segmental and somatic dysfunction of cervical region: Secondary | ICD-10-CM | POA: Diagnosis not present

## 2016-09-09 DIAGNOSIS — J3081 Allergic rhinitis due to animal (cat) (dog) hair and dander: Secondary | ICD-10-CM | POA: Diagnosis not present

## 2016-09-09 DIAGNOSIS — J3089 Other allergic rhinitis: Secondary | ICD-10-CM | POA: Diagnosis not present

## 2016-09-09 DIAGNOSIS — J301 Allergic rhinitis due to pollen: Secondary | ICD-10-CM | POA: Diagnosis not present

## 2016-09-23 DIAGNOSIS — J3081 Allergic rhinitis due to animal (cat) (dog) hair and dander: Secondary | ICD-10-CM | POA: Diagnosis not present

## 2016-09-23 DIAGNOSIS — J301 Allergic rhinitis due to pollen: Secondary | ICD-10-CM | POA: Diagnosis not present

## 2016-09-23 DIAGNOSIS — J3089 Other allergic rhinitis: Secondary | ICD-10-CM | POA: Diagnosis not present

## 2016-10-04 DIAGNOSIS — M549 Dorsalgia, unspecified: Secondary | ICD-10-CM | POA: Diagnosis not present

## 2016-10-04 DIAGNOSIS — M546 Pain in thoracic spine: Secondary | ICD-10-CM | POA: Diagnosis not present

## 2016-10-04 DIAGNOSIS — M5416 Radiculopathy, lumbar region: Secondary | ICD-10-CM | POA: Diagnosis not present

## 2016-10-04 DIAGNOSIS — M5136 Other intervertebral disc degeneration, lumbar region: Secondary | ICD-10-CM | POA: Diagnosis not present

## 2016-10-04 DIAGNOSIS — M4155 Other secondary scoliosis, thoracolumbar region: Secondary | ICD-10-CM | POA: Diagnosis not present

## 2016-10-04 DIAGNOSIS — M4726 Other spondylosis with radiculopathy, lumbar region: Secondary | ICD-10-CM | POA: Diagnosis not present

## 2016-10-06 DIAGNOSIS — Z96641 Presence of right artificial hip joint: Secondary | ICD-10-CM | POA: Diagnosis not present

## 2016-10-06 DIAGNOSIS — Z96649 Presence of unspecified artificial hip joint: Secondary | ICD-10-CM | POA: Diagnosis not present

## 2016-10-06 DIAGNOSIS — M1611 Unilateral primary osteoarthritis, right hip: Secondary | ICD-10-CM | POA: Diagnosis not present

## 2016-10-06 DIAGNOSIS — M1612 Unilateral primary osteoarthritis, left hip: Secondary | ICD-10-CM | POA: Diagnosis not present

## 2016-10-07 DIAGNOSIS — J3089 Other allergic rhinitis: Secondary | ICD-10-CM | POA: Diagnosis not present

## 2016-10-07 DIAGNOSIS — J3081 Allergic rhinitis due to animal (cat) (dog) hair and dander: Secondary | ICD-10-CM | POA: Diagnosis not present

## 2016-10-07 DIAGNOSIS — Z96649 Presence of unspecified artificial hip joint: Secondary | ICD-10-CM | POA: Insufficient documentation

## 2016-10-07 DIAGNOSIS — J301 Allergic rhinitis due to pollen: Secondary | ICD-10-CM | POA: Diagnosis not present

## 2016-10-21 DIAGNOSIS — J301 Allergic rhinitis due to pollen: Secondary | ICD-10-CM | POA: Diagnosis not present

## 2016-10-21 DIAGNOSIS — J3081 Allergic rhinitis due to animal (cat) (dog) hair and dander: Secondary | ICD-10-CM | POA: Diagnosis not present

## 2016-10-21 DIAGNOSIS — J3089 Other allergic rhinitis: Secondary | ICD-10-CM | POA: Diagnosis not present

## 2016-11-04 DIAGNOSIS — J301 Allergic rhinitis due to pollen: Secondary | ICD-10-CM | POA: Diagnosis not present

## 2016-11-04 DIAGNOSIS — J3089 Other allergic rhinitis: Secondary | ICD-10-CM | POA: Diagnosis not present

## 2016-11-04 DIAGNOSIS — J3081 Allergic rhinitis due to animal (cat) (dog) hair and dander: Secondary | ICD-10-CM | POA: Diagnosis not present

## 2016-11-09 DIAGNOSIS — J3081 Allergic rhinitis due to animal (cat) (dog) hair and dander: Secondary | ICD-10-CM | POA: Diagnosis not present

## 2016-11-09 DIAGNOSIS — J301 Allergic rhinitis due to pollen: Secondary | ICD-10-CM | POA: Diagnosis not present

## 2016-11-11 DIAGNOSIS — M4726 Other spondylosis with radiculopathy, lumbar region: Secondary | ICD-10-CM | POA: Diagnosis not present

## 2016-11-11 DIAGNOSIS — M4155 Other secondary scoliosis, thoracolumbar region: Secondary | ICD-10-CM | POA: Diagnosis not present

## 2016-11-11 DIAGNOSIS — M5136 Other intervertebral disc degeneration, lumbar region: Secondary | ICD-10-CM | POA: Diagnosis not present

## 2016-11-11 DIAGNOSIS — M5416 Radiculopathy, lumbar region: Secondary | ICD-10-CM | POA: Diagnosis not present

## 2016-11-16 DIAGNOSIS — M47816 Spondylosis without myelopathy or radiculopathy, lumbar region: Secondary | ICD-10-CM | POA: Diagnosis not present

## 2016-11-16 DIAGNOSIS — M4726 Other spondylosis with radiculopathy, lumbar region: Secondary | ICD-10-CM | POA: Diagnosis not present

## 2016-11-18 DIAGNOSIS — J3081 Allergic rhinitis due to animal (cat) (dog) hair and dander: Secondary | ICD-10-CM | POA: Diagnosis not present

## 2016-11-18 DIAGNOSIS — J3089 Other allergic rhinitis: Secondary | ICD-10-CM | POA: Diagnosis not present

## 2016-11-18 DIAGNOSIS — J301 Allergic rhinitis due to pollen: Secondary | ICD-10-CM | POA: Diagnosis not present

## 2016-11-21 DIAGNOSIS — M4726 Other spondylosis with radiculopathy, lumbar region: Secondary | ICD-10-CM | POA: Diagnosis not present

## 2016-11-21 DIAGNOSIS — M5126 Other intervertebral disc displacement, lumbar region: Secondary | ICD-10-CM | POA: Diagnosis not present

## 2016-11-21 DIAGNOSIS — M5416 Radiculopathy, lumbar region: Secondary | ICD-10-CM | POA: Diagnosis not present

## 2016-11-21 DIAGNOSIS — M48062 Spinal stenosis, lumbar region with neurogenic claudication: Secondary | ICD-10-CM | POA: Diagnosis not present

## 2016-11-21 DIAGNOSIS — M4155 Other secondary scoliosis, thoracolumbar region: Secondary | ICD-10-CM | POA: Diagnosis not present

## 2016-11-21 DIAGNOSIS — M5136 Other intervertebral disc degeneration, lumbar region: Secondary | ICD-10-CM | POA: Diagnosis not present

## 2016-12-02 DIAGNOSIS — J3089 Other allergic rhinitis: Secondary | ICD-10-CM | POA: Diagnosis not present

## 2016-12-02 DIAGNOSIS — J3081 Allergic rhinitis due to animal (cat) (dog) hair and dander: Secondary | ICD-10-CM | POA: Diagnosis not present

## 2016-12-02 DIAGNOSIS — J301 Allergic rhinitis due to pollen: Secondary | ICD-10-CM | POA: Diagnosis not present

## 2016-12-16 DIAGNOSIS — J3089 Other allergic rhinitis: Secondary | ICD-10-CM | POA: Diagnosis not present

## 2016-12-16 DIAGNOSIS — J3081 Allergic rhinitis due to animal (cat) (dog) hair and dander: Secondary | ICD-10-CM | POA: Diagnosis not present

## 2016-12-16 DIAGNOSIS — J301 Allergic rhinitis due to pollen: Secondary | ICD-10-CM | POA: Diagnosis not present

## 2016-12-27 ENCOUNTER — Telehealth: Payer: Self-pay

## 2016-12-27 NOTE — Telephone Encounter (Signed)
Called patient to make CPE appt since receiving her refill request for Synthroid 50 mcg from [facility] on [address]. No answer on either number in chart and no VM set up in order to LVM. Will try again tomorrow

## 2016-12-28 DIAGNOSIS — J3081 Allergic rhinitis due to animal (cat) (dog) hair and dander: Secondary | ICD-10-CM | POA: Diagnosis not present

## 2016-12-28 DIAGNOSIS — J301 Allergic rhinitis due to pollen: Secondary | ICD-10-CM | POA: Diagnosis not present

## 2016-12-28 DIAGNOSIS — J3089 Other allergic rhinitis: Secondary | ICD-10-CM | POA: Diagnosis not present

## 2016-12-28 MED ORDER — SYNTHROID 50 MCG PO TABS: 50.0000 ug | ORAL_TABLET | Freq: Every day | ORAL | 3 refills | Status: DC

## 2016-12-28 NOTE — Telephone Encounter (Signed)
LMTCB

## 2016-12-28 NOTE — Telephone Encounter (Signed)
appt made and refilled medication

## 2016-12-30 DIAGNOSIS — M48062 Spinal stenosis, lumbar region with neurogenic claudication: Secondary | ICD-10-CM | POA: Diagnosis not present

## 2016-12-30 DIAGNOSIS — M4726 Other spondylosis with radiculopathy, lumbar region: Secondary | ICD-10-CM | POA: Diagnosis not present

## 2016-12-30 DIAGNOSIS — J301 Allergic rhinitis due to pollen: Secondary | ICD-10-CM | POA: Diagnosis not present

## 2016-12-30 DIAGNOSIS — J3081 Allergic rhinitis due to animal (cat) (dog) hair and dander: Secondary | ICD-10-CM | POA: Diagnosis not present

## 2016-12-30 DIAGNOSIS — R030 Elevated blood-pressure reading, without diagnosis of hypertension: Secondary | ICD-10-CM | POA: Diagnosis not present

## 2016-12-30 DIAGNOSIS — M5136 Other intervertebral disc degeneration, lumbar region: Secondary | ICD-10-CM | POA: Diagnosis not present

## 2016-12-30 DIAGNOSIS — M5126 Other intervertebral disc displacement, lumbar region: Secondary | ICD-10-CM | POA: Diagnosis not present

## 2016-12-30 DIAGNOSIS — M5416 Radiculopathy, lumbar region: Secondary | ICD-10-CM | POA: Diagnosis not present

## 2016-12-30 DIAGNOSIS — M4155 Other secondary scoliosis, thoracolumbar region: Secondary | ICD-10-CM | POA: Diagnosis not present

## 2017-01-04 DIAGNOSIS — J301 Allergic rhinitis due to pollen: Secondary | ICD-10-CM | POA: Diagnosis not present

## 2017-01-04 DIAGNOSIS — J3081 Allergic rhinitis due to animal (cat) (dog) hair and dander: Secondary | ICD-10-CM | POA: Diagnosis not present

## 2017-01-13 DIAGNOSIS — J3089 Other allergic rhinitis: Secondary | ICD-10-CM | POA: Diagnosis not present

## 2017-01-13 DIAGNOSIS — J3081 Allergic rhinitis due to animal (cat) (dog) hair and dander: Secondary | ICD-10-CM | POA: Diagnosis not present

## 2017-01-13 DIAGNOSIS — J301 Allergic rhinitis due to pollen: Secondary | ICD-10-CM | POA: Diagnosis not present

## 2017-01-18 DIAGNOSIS — J3081 Allergic rhinitis due to animal (cat) (dog) hair and dander: Secondary | ICD-10-CM | POA: Diagnosis not present

## 2017-01-18 DIAGNOSIS — J301 Allergic rhinitis due to pollen: Secondary | ICD-10-CM | POA: Diagnosis not present

## 2017-01-25 DIAGNOSIS — J301 Allergic rhinitis due to pollen: Secondary | ICD-10-CM | POA: Diagnosis not present

## 2017-01-25 DIAGNOSIS — J3089 Other allergic rhinitis: Secondary | ICD-10-CM | POA: Diagnosis not present

## 2017-01-25 DIAGNOSIS — J3081 Allergic rhinitis due to animal (cat) (dog) hair and dander: Secondary | ICD-10-CM | POA: Diagnosis not present

## 2017-01-31 DIAGNOSIS — J3089 Other allergic rhinitis: Secondary | ICD-10-CM | POA: Diagnosis not present

## 2017-02-10 DIAGNOSIS — J3081 Allergic rhinitis due to animal (cat) (dog) hair and dander: Secondary | ICD-10-CM | POA: Diagnosis not present

## 2017-02-10 DIAGNOSIS — J301 Allergic rhinitis due to pollen: Secondary | ICD-10-CM | POA: Diagnosis not present

## 2017-02-10 DIAGNOSIS — J3089 Other allergic rhinitis: Secondary | ICD-10-CM | POA: Diagnosis not present

## 2017-02-22 ENCOUNTER — Encounter: Payer: Self-pay | Admitting: Internal Medicine

## 2017-02-22 DIAGNOSIS — Z1231 Encounter for screening mammogram for malignant neoplasm of breast: Secondary | ICD-10-CM | POA: Diagnosis not present

## 2017-02-24 DIAGNOSIS — J3081 Allergic rhinitis due to animal (cat) (dog) hair and dander: Secondary | ICD-10-CM | POA: Diagnosis not present

## 2017-02-24 DIAGNOSIS — J301 Allergic rhinitis due to pollen: Secondary | ICD-10-CM | POA: Diagnosis not present

## 2017-02-24 DIAGNOSIS — J3089 Other allergic rhinitis: Secondary | ICD-10-CM | POA: Diagnosis not present

## 2017-03-10 DIAGNOSIS — J3089 Other allergic rhinitis: Secondary | ICD-10-CM | POA: Diagnosis not present

## 2017-03-10 DIAGNOSIS — J3081 Allergic rhinitis due to animal (cat) (dog) hair and dander: Secondary | ICD-10-CM | POA: Diagnosis not present

## 2017-03-10 DIAGNOSIS — J301 Allergic rhinitis due to pollen: Secondary | ICD-10-CM | POA: Diagnosis not present

## 2017-03-18 DIAGNOSIS — Z23 Encounter for immunization: Secondary | ICD-10-CM | POA: Diagnosis not present

## 2017-03-24 DIAGNOSIS — J301 Allergic rhinitis due to pollen: Secondary | ICD-10-CM | POA: Diagnosis not present

## 2017-03-24 DIAGNOSIS — J3081 Allergic rhinitis due to animal (cat) (dog) hair and dander: Secondary | ICD-10-CM | POA: Diagnosis not present

## 2017-03-24 DIAGNOSIS — J3089 Other allergic rhinitis: Secondary | ICD-10-CM | POA: Diagnosis not present

## 2017-03-28 DIAGNOSIS — M4726 Other spondylosis with radiculopathy, lumbar region: Secondary | ICD-10-CM | POA: Diagnosis not present

## 2017-03-28 DIAGNOSIS — M5136 Other intervertebral disc degeneration, lumbar region: Secondary | ICD-10-CM | POA: Diagnosis not present

## 2017-03-28 DIAGNOSIS — M48062 Spinal stenosis, lumbar region with neurogenic claudication: Secondary | ICD-10-CM | POA: Diagnosis not present

## 2017-03-28 DIAGNOSIS — M5126 Other intervertebral disc displacement, lumbar region: Secondary | ICD-10-CM | POA: Diagnosis not present

## 2017-03-28 DIAGNOSIS — M4155 Other secondary scoliosis, thoracolumbar region: Secondary | ICD-10-CM | POA: Diagnosis not present

## 2017-03-31 DIAGNOSIS — J3089 Other allergic rhinitis: Secondary | ICD-10-CM | POA: Diagnosis not present

## 2017-04-07 DIAGNOSIS — Z9889 Other specified postprocedural states: Secondary | ICD-10-CM | POA: Diagnosis not present

## 2017-04-07 DIAGNOSIS — J3089 Other allergic rhinitis: Secondary | ICD-10-CM | POA: Diagnosis not present

## 2017-04-07 DIAGNOSIS — H04123 Dry eye syndrome of bilateral lacrimal glands: Secondary | ICD-10-CM | POA: Diagnosis not present

## 2017-04-07 DIAGNOSIS — H401122 Primary open-angle glaucoma, left eye, moderate stage: Secondary | ICD-10-CM | POA: Diagnosis not present

## 2017-04-07 DIAGNOSIS — J301 Allergic rhinitis due to pollen: Secondary | ICD-10-CM | POA: Diagnosis not present

## 2017-04-07 DIAGNOSIS — J3081 Allergic rhinitis due to animal (cat) (dog) hair and dander: Secondary | ICD-10-CM | POA: Diagnosis not present

## 2017-04-07 DIAGNOSIS — H2513 Age-related nuclear cataract, bilateral: Secondary | ICD-10-CM | POA: Diagnosis not present

## 2017-04-07 DIAGNOSIS — H401111 Primary open-angle glaucoma, right eye, mild stage: Secondary | ICD-10-CM | POA: Diagnosis not present

## 2017-04-10 DIAGNOSIS — J3089 Other allergic rhinitis: Secondary | ICD-10-CM | POA: Diagnosis not present

## 2017-04-12 DIAGNOSIS — J3081 Allergic rhinitis due to animal (cat) (dog) hair and dander: Secondary | ICD-10-CM | POA: Diagnosis not present

## 2017-04-12 DIAGNOSIS — J3089 Other allergic rhinitis: Secondary | ICD-10-CM | POA: Diagnosis not present

## 2017-04-12 DIAGNOSIS — J301 Allergic rhinitis due to pollen: Secondary | ICD-10-CM | POA: Diagnosis not present

## 2017-04-19 DIAGNOSIS — J3081 Allergic rhinitis due to animal (cat) (dog) hair and dander: Secondary | ICD-10-CM | POA: Diagnosis not present

## 2017-04-19 DIAGNOSIS — J3089 Other allergic rhinitis: Secondary | ICD-10-CM | POA: Diagnosis not present

## 2017-04-19 DIAGNOSIS — J301 Allergic rhinitis due to pollen: Secondary | ICD-10-CM | POA: Diagnosis not present

## 2017-04-26 ENCOUNTER — Other Ambulatory Visit: Payer: Self-pay | Admitting: Internal Medicine

## 2017-04-26 DIAGNOSIS — M1990 Unspecified osteoarthritis, unspecified site: Secondary | ICD-10-CM

## 2017-04-26 DIAGNOSIS — Z0000 Encounter for general adult medical examination without abnormal findings: Secondary | ICD-10-CM

## 2017-04-26 DIAGNOSIS — E039 Hypothyroidism, unspecified: Secondary | ICD-10-CM

## 2017-04-26 DIAGNOSIS — Z13220 Encounter for screening for lipoid disorders: Secondary | ICD-10-CM

## 2017-05-02 ENCOUNTER — Other Ambulatory Visit: Payer: Medicare Other | Admitting: Internal Medicine

## 2017-05-03 NOTE — Addendum Note (Signed)
Addended by: Mady Haagensen on: 05/03/2017 03:47 PM   Modules accepted: Orders

## 2017-05-04 ENCOUNTER — Ambulatory Visit (INDEPENDENT_AMBULATORY_CARE_PROVIDER_SITE_OTHER): Payer: Medicare Other | Admitting: Internal Medicine

## 2017-05-04 ENCOUNTER — Encounter: Payer: Self-pay | Admitting: Internal Medicine

## 2017-05-04 ENCOUNTER — Other Ambulatory Visit: Payer: Medicare Other | Admitting: Internal Medicine

## 2017-05-04 VITALS — BP 100/58 | HR 57 | Ht 66.5 in | Wt 144.0 lb

## 2017-05-04 DIAGNOSIS — E7800 Pure hypercholesterolemia, unspecified: Secondary | ICD-10-CM | POA: Diagnosis not present

## 2017-05-04 DIAGNOSIS — Z0000 Encounter for general adult medical examination without abnormal findings: Secondary | ICD-10-CM

## 2017-05-04 DIAGNOSIS — K5900 Constipation, unspecified: Secondary | ICD-10-CM

## 2017-05-04 DIAGNOSIS — E039 Hypothyroidism, unspecified: Secondary | ICD-10-CM

## 2017-05-04 DIAGNOSIS — M7918 Myalgia, other site: Secondary | ICD-10-CM

## 2017-05-04 DIAGNOSIS — Z8719 Personal history of other diseases of the digestive system: Secondary | ICD-10-CM

## 2017-05-04 DIAGNOSIS — F338 Other recurrent depressive disorders: Secondary | ICD-10-CM | POA: Diagnosis not present

## 2017-05-04 DIAGNOSIS — J45990 Exercise induced bronchospasm: Secondary | ICD-10-CM | POA: Diagnosis not present

## 2017-05-04 DIAGNOSIS — Z789 Other specified health status: Secondary | ICD-10-CM | POA: Diagnosis not present

## 2017-05-04 LAB — POCT URINALYSIS DIPSTICK
APPEARANCE: NORMAL
Bilirubin, UA: NEGATIVE
GLUCOSE UA: NEGATIVE
Ketones, UA: NEGATIVE
LEUKOCYTES UA: NEGATIVE
Nitrite, UA: NEGATIVE
Odor: NORMAL
Protein, UA: NEGATIVE
RBC UA: NEGATIVE
Spec Grav, UA: 1.01 (ref 1.010–1.025)
Urobilinogen, UA: 0.2 E.U./dL
pH, UA: 7 (ref 5.0–8.0)

## 2017-05-05 DIAGNOSIS — J3081 Allergic rhinitis due to animal (cat) (dog) hair and dander: Secondary | ICD-10-CM | POA: Diagnosis not present

## 2017-05-05 DIAGNOSIS — J301 Allergic rhinitis due to pollen: Secondary | ICD-10-CM | POA: Diagnosis not present

## 2017-05-05 DIAGNOSIS — J3089 Other allergic rhinitis: Secondary | ICD-10-CM | POA: Diagnosis not present

## 2017-05-08 ENCOUNTER — Other Ambulatory Visit: Payer: Self-pay

## 2017-05-08 ENCOUNTER — Other Ambulatory Visit: Payer: Medicare Other | Admitting: Internal Medicine

## 2017-05-08 DIAGNOSIS — M1990 Unspecified osteoarthritis, unspecified site: Secondary | ICD-10-CM | POA: Diagnosis not present

## 2017-05-08 DIAGNOSIS — Z13220 Encounter for screening for lipoid disorders: Secondary | ICD-10-CM | POA: Diagnosis not present

## 2017-05-08 DIAGNOSIS — E785 Hyperlipidemia, unspecified: Secondary | ICD-10-CM | POA: Diagnosis not present

## 2017-05-08 DIAGNOSIS — M4726 Other spondylosis with radiculopathy, lumbar region: Secondary | ICD-10-CM | POA: Diagnosis not present

## 2017-05-08 DIAGNOSIS — M4155 Other secondary scoliosis, thoracolumbar region: Secondary | ICD-10-CM | POA: Diagnosis not present

## 2017-05-08 DIAGNOSIS — M5136 Other intervertebral disc degeneration, lumbar region: Secondary | ICD-10-CM | POA: Diagnosis not present

## 2017-05-08 DIAGNOSIS — E039 Hypothyroidism, unspecified: Secondary | ICD-10-CM

## 2017-05-08 DIAGNOSIS — M48062 Spinal stenosis, lumbar region with neurogenic claudication: Secondary | ICD-10-CM | POA: Diagnosis not present

## 2017-05-08 DIAGNOSIS — M5416 Radiculopathy, lumbar region: Secondary | ICD-10-CM | POA: Diagnosis not present

## 2017-05-08 DIAGNOSIS — Z0000 Encounter for general adult medical examination without abnormal findings: Secondary | ICD-10-CM | POA: Diagnosis not present

## 2017-05-08 DIAGNOSIS — R030 Elevated blood-pressure reading, without diagnosis of hypertension: Secondary | ICD-10-CM | POA: Diagnosis not present

## 2017-05-08 DIAGNOSIS — M5126 Other intervertebral disc displacement, lumbar region: Secondary | ICD-10-CM | POA: Diagnosis not present

## 2017-05-08 MED ORDER — SYNTHROID 50 MCG PO TABS: 50.0000 ug | ORAL_TABLET | Freq: Every day | ORAL | 11 refills | Status: DC

## 2017-05-09 LAB — LIPID PANEL
Cholesterol: 203 mg/dL — ABNORMAL HIGH (ref <200)
HDL: 70 mg/dL (ref >50)
LDL Cholesterol (Calc): 118 mg/dL (calc) — ABNORMAL HIGH
NON-HDL CHOLESTEROL (CALC): 133 mg/dL — AB (ref <130)
Total CHOL/HDL Ratio: 2.9 (calc) (ref <5.0)
Triglycerides: 60 mg/dL (ref <150)

## 2017-05-09 LAB — CBC WITH DIFFERENTIAL/PLATELET
BASOS ABS: 63 {cells}/uL (ref 0–200)
Basophils Relative: 1.1 %
EOS PCT: 8 %
Eosinophils Absolute: 456 cells/uL (ref 15–500)
HEMATOCRIT: 37.5 % (ref 35.0–45.0)
Hemoglobin: 12.8 g/dL (ref 11.7–15.5)
LYMPHS ABS: 1260 {cells}/uL (ref 850–3900)
MCH: 29.2 pg (ref 27.0–33.0)
MCHC: 34.1 g/dL (ref 32.0–36.0)
MCV: 85.6 fL (ref 80.0–100.0)
MPV: 9.1 fL (ref 7.5–12.5)
Monocytes Relative: 8.3 %
NEUTROS PCT: 60.5 %
Neutro Abs: 3449 cells/uL (ref 1500–7800)
Platelets: 322 10*3/uL (ref 140–400)
RBC: 4.38 10*6/uL (ref 3.80–5.10)
RDW: 12.7 % (ref 11.0–15.0)
Total Lymphocyte: 22.1 %
WBC mixed population: 473 cells/uL (ref 200–950)
WBC: 5.7 10*3/uL (ref 3.8–10.8)

## 2017-05-09 LAB — COMPLETE METABOLIC PANEL WITH GFR
AG Ratio: 1.9 (calc) (ref 1.0–2.5)
ALT: 17 U/L (ref 6–29)
AST: 24 U/L (ref 10–35)
Albumin: 4.2 g/dL (ref 3.6–5.1)
Alkaline phosphatase (APISO): 56 U/L (ref 33–130)
BUN: 14 mg/dL (ref 7–25)
CALCIUM: 9.7 mg/dL (ref 8.6–10.4)
CO2: 27 mmol/L (ref 20–32)
CREATININE: 0.97 mg/dL (ref 0.50–0.99)
Chloride: 103 mmol/L (ref 98–110)
GFR, EST NON AFRICAN AMERICAN: 60 mL/min/{1.73_m2} (ref >=60)
GFR, Est African American: 69 mL/min/{1.73_m2} (ref >=60)
Globulin: 2.2 g/dL (calc) (ref 1.9–3.7)
Glucose, Bld: 89 mg/dL (ref 65–99)
Potassium: 4.3 mmol/L (ref 3.5–5.3)
Sodium: 139 mmol/L (ref 135–146)
Total Bilirubin: 0.6 mg/dL (ref 0.2–1.2)
Total Protein: 6.4 g/dL (ref 6.1–8.1)

## 2017-05-09 LAB — VITAMIN D 25 HYDROXY (VIT D DEFICIENCY, FRACTURES): VIT D 25 HYDROXY: 47 ng/mL (ref 30–100)

## 2017-05-09 LAB — TSH: TSH: 2.91 m[IU]/L (ref 0.40–4.50)

## 2017-05-19 DIAGNOSIS — J3081 Allergic rhinitis due to animal (cat) (dog) hair and dander: Secondary | ICD-10-CM | POA: Diagnosis not present

## 2017-05-19 DIAGNOSIS — J301 Allergic rhinitis due to pollen: Secondary | ICD-10-CM | POA: Diagnosis not present

## 2017-05-19 DIAGNOSIS — J3089 Other allergic rhinitis: Secondary | ICD-10-CM | POA: Diagnosis not present

## 2017-05-25 DIAGNOSIS — M4726 Other spondylosis with radiculopathy, lumbar region: Secondary | ICD-10-CM | POA: Diagnosis not present

## 2017-05-25 DIAGNOSIS — M48061 Spinal stenosis, lumbar region without neurogenic claudication: Secondary | ICD-10-CM | POA: Diagnosis not present

## 2017-05-25 DIAGNOSIS — M5136 Other intervertebral disc degeneration, lumbar region: Secondary | ICD-10-CM | POA: Diagnosis not present

## 2017-06-06 DIAGNOSIS — J3081 Allergic rhinitis due to animal (cat) (dog) hair and dander: Secondary | ICD-10-CM | POA: Diagnosis not present

## 2017-06-06 DIAGNOSIS — J301 Allergic rhinitis due to pollen: Secondary | ICD-10-CM | POA: Diagnosis not present

## 2017-06-06 DIAGNOSIS — J3089 Other allergic rhinitis: Secondary | ICD-10-CM | POA: Diagnosis not present

## 2017-06-16 DIAGNOSIS — K222 Esophageal obstruction: Secondary | ICD-10-CM | POA: Diagnosis not present

## 2017-06-16 DIAGNOSIS — K5904 Chronic idiopathic constipation: Secondary | ICD-10-CM | POA: Insufficient documentation

## 2017-06-16 DIAGNOSIS — K581 Irritable bowel syndrome with constipation: Secondary | ICD-10-CM | POA: Diagnosis not present

## 2017-06-17 NOTE — Patient Instructions (Signed)
It was a pleasure to see you today.  Continue same medications and return in 1 year or as needed. 

## 2017-06-17 NOTE — Progress Notes (Signed)
Subjective:    Patient ID: Chelsea Cantrell, female    DOB: 1946/02/08, 72 y.o.   MRN: [id number]  HPI  72 year old White Female emale in today for health maintenance exam, Medicare wellness exam and evaluation of medical issues.  She has a history of recurrent pancreatitis is not had an episode in several years now.  She had recurrent episodes in 2016.  Was placed in her bile/pancreatic duct at [facility] in June 2016 and she had a sphincterotomy.  This helped to control her episodes.  Stent was subsequently removed by Dr. Earlean Shawl  History of hypothyroidism, GE reflux, exercise-induced asthma, Schatzki's ring, allergic rhinitis, hyperplastic colon polyps, estrogen replacement, constipation.  History of spinal stenosis seasonal affective disorder.  No known drug allergies.  Intolerant of generic Wellbutrin.  Has been evaluated at low power allergy and takes allergy injections.  Allergic to cats dogs and pollen.  Was tried on Strattera in 2003 for attention deficit disorder but currently does not take any medication for attention deficit.  Has seen Dr. 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  Is taking Levoxyl for hyperlipidemia for some time.  Is also tried Amitiza for chronic constipation but more recently treated with Linzess.  Takes Celebrex and chondroitin sulfate for musculoskeletal pain.  Social history: Single, never married.  No children.  Retired as an Biomedical engineer.  Exercises routinely.  Did smoke prior to 1976 but currently non-smoker.  She is a vegetarian.  She occasionally drinks wine.  Family history: Father died at age 14 of lung cancer with history of MI at age 78 and hyperlipidemia.  Mother died at age 61 but had stroke 4 years before she passed away.  One brother and one sister in good health.    Review of Systems  Constitutional: Negative.   All other systems  reviewed and are negative.      Objective:   Physical Exam  Constitutional: She is oriented to person, place, and time. She appears well-developed and well-nourished. No distress.  HENT:  Head: Normocephalic and atraumatic.  Right Ear: External ear normal.  Left Ear: External ear normal.  Mouth/Throat: Oropharynx is clear and moist.  Eyes: Conjunctivae and EOM are normal. Pupils are equal, round, and reactive to light.  Neck: Neck supple. No JVD present. No thyromegaly present.  Cardiovascular: Normal rate, regular rhythm, normal heart sounds and intact distal pulses.  No murmur heard. Pulmonary/Chest: Effort normal and breath sounds normal. No respiratory distress. She has no wheezes. She has no rales. She exhibits no tenderness.  Breasts normal female without masses  Abdominal: Soft. Bowel sounds are normal. She exhibits no distension and no mass. There is no tenderness. There is no rebound and no guarding.  Genitourinary:  Genitourinary Comments: Deferred to GYN  Lymphadenopathy:    She has no cervical adenopathy.  Neurological: She is alert and oriented to person, place, and time. She has normal reflexes. No cranial nerve deficit. Coordination normal.  Skin: Skin is warm and dry. No rash noted. She is not diaphoretic.  Psychiatric: She has a normal mood and affect. Her behavior is normal. Judgment and thought content normal.  Vitals reviewed.         Assessment & Plan:  Normal health maintenance exam  History of recurrent pancreatitis status post stent placement and removal and doing well with no further episodes  History of allergic rhinitis  Seasonal affective disorder  History of constipation treated with Linzess  Hypothyroidism-TSH is normal on thyroid replacement  History of exercise-induced asthma  History of osteoarthritis  History of irritable bowel syndrome  Elevated LDL cholesterol at 118 with HDL of 70 and total cholesterol of 203.  Continue diet and  exercise.  She takes fish oil.  History of lumbar degenerative disc disease treated with epidural steroid injections.  Subjective:   Patient presents for Medicare Annual/Subsequent preventive examination.  Review Past Medical/Family/Social:   Risk Factors  Current exercise habits: Works at a gym regularly. Dietary issues discussed: She is a vegetarian  Cardiac risk factors: Family history Father.  Stroke in mother.  Depression Screen  (Note: if answer to either of the following is "Yes", a more complete depression screening is indicated)   Over the past two weeks, have you felt down, depressed or hopeless? No  Over the past two weeks, have you felt little interest or pleasure in doing things? No Have you lost interest or pleasure in daily life? No Do you often feel hopeless? No Do you cry easily over simple problems? No   Activities of Daily Living  In your present state of health, do you have any difficulty performing the following activities?:   Driving? No  Managing money? No  Feeding yourself? No  Getting from bed to chair? No  Climbing a flight of stairs? No  Preparing food and eating?: No  Bathing or showering? No  Getting dressed: No  Getting to the toilet? No  Using the toilet:No  Moving around from place to place: No  In the past year have you fallen or had a near fall?:No  Are you sexually active? No  Do you have more than one partner? No   Hearing Difficulties: No  Do you often ask people to speak up or repeat themselves?  Yes Do you experience ringing or noises in your ears?  yes  Do you have difficulty understanding soft or whispered voices?  yes Do you feel that you have a problem with memory? No Do you often misplace items? No    Home Safety:  Do you have a smoke alarm at your residence? Yes Do you have grab bars in the bathroom?  No Do you have throw rugs in your house?  Yes   Cognitive Testing  Alert? Yes Normal Appearance?Yes  Oriented to  person? Yes Place? Yes  Time? Yes  Recall of three objects? Yes  Can perform simple calculations? Yes  Displays appropriate judgment?Yes  Can read the correct time from a watch face?Yes   List the Names of Other Physician/Practitioners you currently use:  See referral list for the physicians patient is currently seeing.     Review of Systems: See above   Objective:     General appearance: Appears younger than stated age Head: Normocephalic, without obvious abnormality, atraumatic  Eyes: conj clear, EOMi PEERLA  Ears: normal TM's and external ear canals both ears  Nose: Nares normal. Septum midline. Mucosa normal. No drainage or sinus tenderness.  Throat: lips, mucosa, and tongue normal; teeth and gums normal  Neck: no adenopathy, no carotid bruit, no JVD, supple, symmetrical, trachea midline and thyroid not enlarged, symmetric, no tenderness/mass/nodules  No CVA tenderness.  Lungs: clear to auscultation bilaterally  Breasts: normal appearance, no masses or tenderness Heart: regular rate and rhythm, S1, S2 normal, no murmur, click, rub or gallop  Abdomen: soft, non-tender; bowel sounds normal; no masses, no organomegaly  Musculoskeletal: ROM normal in all joints,  no crepitus, no deformity, Normal muscle strengthen. Back  is symmetric, no curvature. Skin: Skin color, texture, turgor normal. No rashes or lesions  Lymph nodes: Cervical, supraclavicular, and axillary nodes normal.  Neurologic: CN 2 -12 Normal, Normal symmetric reflexes. Normal coordination and gait  Psych: Alert & Oriented x 3, Mood appear stable.    Assessment:    Annual wellness medicare exam   Plan:    During the course of the visit the patient was educated and counseled about appropriate screening and preventive services including:   Annual mammogram  Annual flu vaccine     Patient Instructions (the written plan) was given to the patient.  Medicare Attestation  I have personally reviewed:  The  patient's medical and social history  Their use of alcohol, tobacco or illicit drugs  Their current medications and supplements  The patient's functional ability including ADLs,fall risks, home safety risks, cognitive, and hearing and visual impairment  Diet and physical activities  Evidence for depression or mood disorders  The patient's weight, height, BMI, and visual acuity have been recorded in the chart. I have made referrals, counseling, and provided education to the patient based on review of the above and I have provided the patient with a written personalized care plan for preventive services.

## 2017-06-21 DIAGNOSIS — J301 Allergic rhinitis due to pollen: Secondary | ICD-10-CM | POA: Diagnosis not present

## 2017-06-21 DIAGNOSIS — J3089 Other allergic rhinitis: Secondary | ICD-10-CM | POA: Diagnosis not present

## 2017-06-21 DIAGNOSIS — J3081 Allergic rhinitis due to animal (cat) (dog) hair and dander: Secondary | ICD-10-CM | POA: Diagnosis not present

## 2017-07-06 DIAGNOSIS — J3081 Allergic rhinitis due to animal (cat) (dog) hair and dander: Secondary | ICD-10-CM | POA: Diagnosis not present

## 2017-07-06 DIAGNOSIS — J3089 Other allergic rhinitis: Secondary | ICD-10-CM | POA: Diagnosis not present

## 2017-07-06 DIAGNOSIS — J301 Allergic rhinitis due to pollen: Secondary | ICD-10-CM | POA: Diagnosis not present

## 2017-07-07 DIAGNOSIS — H40013 Open angle with borderline findings, low risk, bilateral: Secondary | ICD-10-CM | POA: Diagnosis not present

## 2017-07-11 DIAGNOSIS — J3089 Other allergic rhinitis: Secondary | ICD-10-CM | POA: Diagnosis not present

## 2017-07-11 DIAGNOSIS — J4520 Mild intermittent asthma, uncomplicated: Secondary | ICD-10-CM | POA: Diagnosis not present

## 2017-07-11 DIAGNOSIS — J3081 Allergic rhinitis due to animal (cat) (dog) hair and dander: Secondary | ICD-10-CM | POA: Diagnosis not present

## 2017-07-11 DIAGNOSIS — J301 Allergic rhinitis due to pollen: Secondary | ICD-10-CM | POA: Diagnosis not present

## 2017-07-19 DIAGNOSIS — J301 Allergic rhinitis due to pollen: Secondary | ICD-10-CM | POA: Diagnosis not present

## 2017-07-19 DIAGNOSIS — J3089 Other allergic rhinitis: Secondary | ICD-10-CM | POA: Diagnosis not present

## 2017-07-19 DIAGNOSIS — J3081 Allergic rhinitis due to animal (cat) (dog) hair and dander: Secondary | ICD-10-CM | POA: Diagnosis not present

## 2017-08-11 DIAGNOSIS — J3089 Other allergic rhinitis: Secondary | ICD-10-CM | POA: Diagnosis not present

## 2017-08-11 DIAGNOSIS — J301 Allergic rhinitis due to pollen: Secondary | ICD-10-CM | POA: Diagnosis not present

## 2017-08-11 DIAGNOSIS — J3081 Allergic rhinitis due to animal (cat) (dog) hair and dander: Secondary | ICD-10-CM | POA: Diagnosis not present

## 2017-08-25 DIAGNOSIS — J3089 Other allergic rhinitis: Secondary | ICD-10-CM | POA: Diagnosis not present

## 2017-08-25 DIAGNOSIS — J301 Allergic rhinitis due to pollen: Secondary | ICD-10-CM | POA: Diagnosis not present

## 2017-08-25 DIAGNOSIS — J3081 Allergic rhinitis due to animal (cat) (dog) hair and dander: Secondary | ICD-10-CM | POA: Diagnosis not present

## 2017-08-29 DIAGNOSIS — J301 Allergic rhinitis due to pollen: Secondary | ICD-10-CM | POA: Diagnosis not present

## 2017-08-29 DIAGNOSIS — J3081 Allergic rhinitis due to animal (cat) (dog) hair and dander: Secondary | ICD-10-CM | POA: Diagnosis not present

## 2017-09-11 DIAGNOSIS — J301 Allergic rhinitis due to pollen: Secondary | ICD-10-CM | POA: Diagnosis not present

## 2017-09-11 DIAGNOSIS — J3089 Other allergic rhinitis: Secondary | ICD-10-CM | POA: Diagnosis not present

## 2017-09-11 DIAGNOSIS — J3081 Allergic rhinitis due to animal (cat) (dog) hair and dander: Secondary | ICD-10-CM | POA: Diagnosis not present

## 2017-09-27 DIAGNOSIS — J3081 Allergic rhinitis due to animal (cat) (dog) hair and dander: Secondary | ICD-10-CM | POA: Diagnosis not present

## 2017-09-27 DIAGNOSIS — J301 Allergic rhinitis due to pollen: Secondary | ICD-10-CM | POA: Diagnosis not present

## 2017-09-27 DIAGNOSIS — J3089 Other allergic rhinitis: Secondary | ICD-10-CM | POA: Diagnosis not present

## 2017-10-11 DIAGNOSIS — J3089 Other allergic rhinitis: Secondary | ICD-10-CM | POA: Diagnosis not present

## 2017-10-11 DIAGNOSIS — J3081 Allergic rhinitis due to animal (cat) (dog) hair and dander: Secondary | ICD-10-CM | POA: Diagnosis not present

## 2017-10-11 DIAGNOSIS — J301 Allergic rhinitis due to pollen: Secondary | ICD-10-CM | POA: Diagnosis not present

## 2017-10-25 DIAGNOSIS — J3089 Other allergic rhinitis: Secondary | ICD-10-CM | POA: Diagnosis not present

## 2017-10-25 DIAGNOSIS — J3081 Allergic rhinitis due to animal (cat) (dog) hair and dander: Secondary | ICD-10-CM | POA: Diagnosis not present

## 2017-10-25 DIAGNOSIS — J301 Allergic rhinitis due to pollen: Secondary | ICD-10-CM | POA: Diagnosis not present

## 2017-10-27 DIAGNOSIS — J301 Allergic rhinitis due to pollen: Secondary | ICD-10-CM | POA: Diagnosis not present

## 2017-10-27 DIAGNOSIS — J3089 Other allergic rhinitis: Secondary | ICD-10-CM | POA: Diagnosis not present

## 2017-10-27 DIAGNOSIS — J3081 Allergic rhinitis due to animal (cat) (dog) hair and dander: Secondary | ICD-10-CM | POA: Diagnosis not present

## 2017-11-01 DIAGNOSIS — J3089 Other allergic rhinitis: Secondary | ICD-10-CM | POA: Diagnosis not present

## 2017-11-01 DIAGNOSIS — J301 Allergic rhinitis due to pollen: Secondary | ICD-10-CM | POA: Diagnosis not present

## 2017-11-01 DIAGNOSIS — J3081 Allergic rhinitis due to animal (cat) (dog) hair and dander: Secondary | ICD-10-CM | POA: Diagnosis not present

## 2017-11-08 DIAGNOSIS — J3089 Other allergic rhinitis: Secondary | ICD-10-CM | POA: Diagnosis not present

## 2017-11-08 DIAGNOSIS — J3081 Allergic rhinitis due to animal (cat) (dog) hair and dander: Secondary | ICD-10-CM | POA: Diagnosis not present

## 2017-11-08 DIAGNOSIS — J301 Allergic rhinitis due to pollen: Secondary | ICD-10-CM | POA: Diagnosis not present

## 2017-11-14 DIAGNOSIS — J3089 Other allergic rhinitis: Secondary | ICD-10-CM | POA: Diagnosis not present

## 2017-11-16 ENCOUNTER — Telehealth: Payer: Self-pay

## 2017-11-16 NOTE — Telephone Encounter (Signed)
Left voicemail to call back, we need to schedule an annual physical AFTER 12/13.

## 2017-12-01 DIAGNOSIS — J3081 Allergic rhinitis due to animal (cat) (dog) hair and dander: Secondary | ICD-10-CM | POA: Diagnosis not present

## 2017-12-01 DIAGNOSIS — J3089 Other allergic rhinitis: Secondary | ICD-10-CM | POA: Diagnosis not present

## 2017-12-01 DIAGNOSIS — J301 Allergic rhinitis due to pollen: Secondary | ICD-10-CM | POA: Diagnosis not present

## 2017-12-05 DIAGNOSIS — J301 Allergic rhinitis due to pollen: Secondary | ICD-10-CM | POA: Diagnosis not present

## 2017-12-05 DIAGNOSIS — J3089 Other allergic rhinitis: Secondary | ICD-10-CM | POA: Diagnosis not present

## 2017-12-05 DIAGNOSIS — J3081 Allergic rhinitis due to animal (cat) (dog) hair and dander: Secondary | ICD-10-CM | POA: Diagnosis not present

## 2017-12-14 DIAGNOSIS — J3089 Other allergic rhinitis: Secondary | ICD-10-CM | POA: Diagnosis not present

## 2017-12-14 DIAGNOSIS — J3081 Allergic rhinitis due to animal (cat) (dog) hair and dander: Secondary | ICD-10-CM | POA: Diagnosis not present

## 2017-12-14 DIAGNOSIS — J301 Allergic rhinitis due to pollen: Secondary | ICD-10-CM | POA: Diagnosis not present

## 2017-12-27 DIAGNOSIS — J301 Allergic rhinitis due to pollen: Secondary | ICD-10-CM | POA: Diagnosis not present

## 2017-12-27 DIAGNOSIS — J3089 Other allergic rhinitis: Secondary | ICD-10-CM | POA: Diagnosis not present

## 2017-12-27 DIAGNOSIS — J3081 Allergic rhinitis due to animal (cat) (dog) hair and dander: Secondary | ICD-10-CM | POA: Diagnosis not present

## 2018-01-09 DIAGNOSIS — J3089 Other allergic rhinitis: Secondary | ICD-10-CM | POA: Diagnosis not present

## 2018-01-09 DIAGNOSIS — J301 Allergic rhinitis due to pollen: Secondary | ICD-10-CM | POA: Diagnosis not present

## 2018-01-09 DIAGNOSIS — J3081 Allergic rhinitis due to animal (cat) (dog) hair and dander: Secondary | ICD-10-CM | POA: Diagnosis not present

## 2018-01-10 DIAGNOSIS — Z9889 Other specified postprocedural states: Secondary | ICD-10-CM | POA: Diagnosis not present

## 2018-01-10 DIAGNOSIS — H04123 Dry eye syndrome of bilateral lacrimal glands: Secondary | ICD-10-CM | POA: Diagnosis not present

## 2018-01-10 DIAGNOSIS — H401122 Primary open-angle glaucoma, left eye, moderate stage: Secondary | ICD-10-CM | POA: Diagnosis not present

## 2018-01-10 DIAGNOSIS — H401111 Primary open-angle glaucoma, right eye, mild stage: Secondary | ICD-10-CM | POA: Diagnosis not present

## 2018-01-10 DIAGNOSIS — H2513 Age-related nuclear cataract, bilateral: Secondary | ICD-10-CM | POA: Diagnosis not present

## 2018-01-11 DIAGNOSIS — J3089 Other allergic rhinitis: Secondary | ICD-10-CM | POA: Diagnosis not present

## 2018-01-16 DIAGNOSIS — J3089 Other allergic rhinitis: Secondary | ICD-10-CM | POA: Diagnosis not present

## 2018-01-17 ENCOUNTER — Other Ambulatory Visit: Payer: Self-pay | Admitting: Physician Assistant

## 2018-01-17 DIAGNOSIS — D229 Melanocytic nevi, unspecified: Secondary | ICD-10-CM | POA: Diagnosis not present

## 2018-01-17 DIAGNOSIS — C44719 Basal cell carcinoma of skin of left lower limb, including hip: Secondary | ICD-10-CM | POA: Diagnosis not present

## 2018-01-17 DIAGNOSIS — L814 Other melanin hyperpigmentation: Secondary | ICD-10-CM | POA: Diagnosis not present

## 2018-01-19 DIAGNOSIS — J3089 Other allergic rhinitis: Secondary | ICD-10-CM | POA: Diagnosis not present

## 2018-01-23 DIAGNOSIS — J3089 Other allergic rhinitis: Secondary | ICD-10-CM | POA: Diagnosis not present

## 2018-01-29 DIAGNOSIS — J3089 Other allergic rhinitis: Secondary | ICD-10-CM | POA: Diagnosis not present

## 2018-01-29 DIAGNOSIS — J3081 Allergic rhinitis due to animal (cat) (dog) hair and dander: Secondary | ICD-10-CM | POA: Diagnosis not present

## 2018-01-29 DIAGNOSIS — J301 Allergic rhinitis due to pollen: Secondary | ICD-10-CM | POA: Diagnosis not present

## 2018-02-14 DIAGNOSIS — J3089 Other allergic rhinitis: Secondary | ICD-10-CM | POA: Diagnosis not present

## 2018-02-14 DIAGNOSIS — J301 Allergic rhinitis due to pollen: Secondary | ICD-10-CM | POA: Diagnosis not present

## 2018-02-14 DIAGNOSIS — J3081 Allergic rhinitis due to animal (cat) (dog) hair and dander: Secondary | ICD-10-CM | POA: Diagnosis not present

## 2018-03-02 DIAGNOSIS — J3089 Other allergic rhinitis: Secondary | ICD-10-CM | POA: Diagnosis not present

## 2018-03-02 DIAGNOSIS — J3081 Allergic rhinitis due to animal (cat) (dog) hair and dander: Secondary | ICD-10-CM | POA: Diagnosis not present

## 2018-03-02 DIAGNOSIS — J301 Allergic rhinitis due to pollen: Secondary | ICD-10-CM | POA: Diagnosis not present

## 2018-03-07 DIAGNOSIS — C44719 Basal cell carcinoma of skin of left lower limb, including hip: Secondary | ICD-10-CM | POA: Diagnosis not present

## 2018-03-07 DIAGNOSIS — L905 Scar conditions and fibrosis of skin: Secondary | ICD-10-CM | POA: Diagnosis not present

## 2018-03-14 DIAGNOSIS — J301 Allergic rhinitis due to pollen: Secondary | ICD-10-CM | POA: Diagnosis not present

## 2018-03-14 DIAGNOSIS — J3089 Other allergic rhinitis: Secondary | ICD-10-CM | POA: Diagnosis not present

## 2018-03-14 DIAGNOSIS — J3081 Allergic rhinitis due to animal (cat) (dog) hair and dander: Secondary | ICD-10-CM | POA: Diagnosis not present

## 2018-03-15 DIAGNOSIS — Z23 Encounter for immunization: Secondary | ICD-10-CM | POA: Diagnosis not present

## 2018-03-28 DIAGNOSIS — J3081 Allergic rhinitis due to animal (cat) (dog) hair and dander: Secondary | ICD-10-CM | POA: Diagnosis not present

## 2018-03-28 DIAGNOSIS — J3089 Other allergic rhinitis: Secondary | ICD-10-CM | POA: Diagnosis not present

## 2018-03-28 DIAGNOSIS — J301 Allergic rhinitis due to pollen: Secondary | ICD-10-CM | POA: Diagnosis not present

## 2018-04-09 ENCOUNTER — Other Ambulatory Visit: Payer: Self-pay

## 2018-04-11 DIAGNOSIS — J301 Allergic rhinitis due to pollen: Secondary | ICD-10-CM | POA: Diagnosis not present

## 2018-04-11 DIAGNOSIS — J3081 Allergic rhinitis due to animal (cat) (dog) hair and dander: Secondary | ICD-10-CM | POA: Diagnosis not present

## 2018-04-11 DIAGNOSIS — J3089 Other allergic rhinitis: Secondary | ICD-10-CM | POA: Diagnosis not present

## 2018-04-13 DIAGNOSIS — K5904 Chronic idiopathic constipation: Secondary | ICD-10-CM | POA: Diagnosis not present

## 2018-04-26 DIAGNOSIS — M79641 Pain in right hand: Secondary | ICD-10-CM | POA: Diagnosis not present

## 2018-04-28 DIAGNOSIS — M79641 Pain in right hand: Secondary | ICD-10-CM | POA: Diagnosis not present

## 2018-05-01 ENCOUNTER — Other Ambulatory Visit: Payer: Self-pay | Admitting: Internal Medicine

## 2018-05-01 DIAGNOSIS — M7918 Myalgia, other site: Secondary | ICD-10-CM

## 2018-05-01 DIAGNOSIS — J3081 Allergic rhinitis due to animal (cat) (dog) hair and dander: Secondary | ICD-10-CM | POA: Diagnosis not present

## 2018-05-01 DIAGNOSIS — M79641 Pain in right hand: Secondary | ICD-10-CM | POA: Insufficient documentation

## 2018-05-01 DIAGNOSIS — J45990 Exercise induced bronchospasm: Secondary | ICD-10-CM

## 2018-05-01 DIAGNOSIS — Z0000 Encounter for general adult medical examination without abnormal findings: Secondary | ICD-10-CM

## 2018-05-01 DIAGNOSIS — E7800 Pure hypercholesterolemia, unspecified: Secondary | ICD-10-CM

## 2018-05-01 DIAGNOSIS — F338 Other recurrent depressive disorders: Secondary | ICD-10-CM

## 2018-05-01 DIAGNOSIS — J3089 Other allergic rhinitis: Secondary | ICD-10-CM | POA: Diagnosis not present

## 2018-05-01 DIAGNOSIS — J301 Allergic rhinitis due to pollen: Secondary | ICD-10-CM | POA: Diagnosis not present

## 2018-05-04 ENCOUNTER — Other Ambulatory Visit: Payer: Self-pay | Admitting: Internal Medicine

## 2018-05-04 ENCOUNTER — Other Ambulatory Visit: Payer: Medicare Other | Admitting: Internal Medicine

## 2018-05-04 DIAGNOSIS — Z0000 Encounter for general adult medical examination without abnormal findings: Secondary | ICD-10-CM

## 2018-05-04 DIAGNOSIS — E7800 Pure hypercholesterolemia, unspecified: Secondary | ICD-10-CM | POA: Diagnosis not present

## 2018-05-04 DIAGNOSIS — M7918 Myalgia, other site: Secondary | ICD-10-CM | POA: Diagnosis not present

## 2018-05-04 DIAGNOSIS — F338 Other recurrent depressive disorders: Secondary | ICD-10-CM | POA: Diagnosis not present

## 2018-05-04 DIAGNOSIS — J45990 Exercise induced bronchospasm: Secondary | ICD-10-CM

## 2018-05-04 LAB — COMPLETE METABOLIC PANEL WITH GFR
AG Ratio: 1.9 (calc) (ref 1.0–2.5)
ALKALINE PHOSPHATASE (APISO): 54 U/L (ref 33–130)
ALT: 15 U/L (ref 6–29)
AST: 20 U/L (ref 10–35)
Albumin: 4.3 g/dL (ref 3.6–5.1)
BUN/Creatinine Ratio: 21 (calc) (ref 6–22)
BUN: 20 mg/dL (ref 7–25)
CO2: 28 mmol/L (ref 20–32)
Calcium: 9.6 mg/dL (ref 8.6–10.4)
Chloride: 98 mmol/L (ref 98–110)
Creat: 0.96 mg/dL — ABNORMAL HIGH (ref 0.60–0.93)
GFR, Est African American: 69 mL/min/{1.73_m2} (ref >=60)
GFR, Est Non African American: 60 mL/min/{1.73_m2} (ref >=60)
Globulin: 2.3 g/dL (calc) (ref 1.9–3.7)
Glucose, Bld: 76 mg/dL (ref 65–99)
Potassium: 4.1 mmol/L (ref 3.5–5.3)
Sodium: 137 mmol/L (ref 135–146)
Total Bilirubin: 0.6 mg/dL (ref 0.2–1.2)
Total Protein: 6.6 g/dL (ref 6.1–8.1)

## 2018-05-04 LAB — LIPID PANEL
CHOL/HDL RATIO: 2.3 (calc) (ref <5.0)
Cholesterol: 183 mg/dL (ref <200)
HDL: 78 mg/dL (ref >50)
LDL CHOLESTEROL (CALC): 91 mg/dL
Non-HDL Cholesterol (Calc): 105 mg/dL (calc) (ref <130)
Triglycerides: 58 mg/dL (ref <150)

## 2018-05-04 LAB — CBC WITH DIFFERENTIAL/PLATELET
BASOS ABS: 82 {cells}/uL (ref 0–200)
Basophils Relative: 1 %
EOS PCT: 3.4 %
Eosinophils Absolute: 279 cells/uL (ref 15–500)
HCT: 40.3 % (ref 35.0–45.0)
Hemoglobin: 13.9 g/dL (ref 11.7–15.5)
Lymphs Abs: 1509 cells/uL (ref 850–3900)
MCH: 31 pg (ref 27.0–33.0)
MCHC: 34.5 g/dL (ref 32.0–36.0)
MCV: 89.8 fL (ref 80.0–100.0)
MPV: 8.9 fL (ref 7.5–12.5)
Monocytes Relative: 9.7 %
Neutro Abs: 5535 cells/uL (ref 1500–7800)
Neutrophils Relative %: 67.5 %
Platelets: 411 10*3/uL — ABNORMAL HIGH (ref 140–400)
RBC: 4.49 10*6/uL (ref 3.80–5.10)
RDW: 12 % (ref 11.0–15.0)
Total Lymphocyte: 18.4 %
WBC mixed population: 795 cells/uL (ref 200–950)
WBC: 8.2 10*3/uL (ref 3.8–10.8)

## 2018-05-04 LAB — TSH: TSH: 2.27 mIU/L (ref 0.40–4.50)

## 2018-05-04 NOTE — Addendum Note (Signed)
Addended by: Lars Masson on: 05/04/2018 12:28 PM   Modules accepted: Orders

## 2018-05-06 ENCOUNTER — Other Ambulatory Visit: Payer: Self-pay | Admitting: Internal Medicine

## 2018-05-07 ENCOUNTER — Ambulatory Visit (INDEPENDENT_AMBULATORY_CARE_PROVIDER_SITE_OTHER): Payer: Medicare Other | Admitting: Internal Medicine

## 2018-05-07 VITALS — BP 108/74 | HR 57 | Temp 96.2°F | Resp 16 | Ht 67.0 in | Wt 130.5 lb

## 2018-05-07 DIAGNOSIS — M5136 Other intervertebral disc degeneration, lumbar region: Secondary | ICD-10-CM

## 2018-05-07 DIAGNOSIS — J3081 Allergic rhinitis due to animal (cat) (dog) hair and dander: Secondary | ICD-10-CM | POA: Diagnosis not present

## 2018-05-07 DIAGNOSIS — F338 Other recurrent depressive disorders: Secondary | ICD-10-CM | POA: Diagnosis not present

## 2018-05-07 DIAGNOSIS — M51369 Other intervertebral disc degeneration, lumbar region without mention of lumbar back pain or lower extremity pain: Secondary | ICD-10-CM

## 2018-05-07 DIAGNOSIS — Z789 Other specified health status: Secondary | ICD-10-CM | POA: Diagnosis not present

## 2018-05-07 DIAGNOSIS — Z0000 Encounter for general adult medical examination without abnormal findings: Secondary | ICD-10-CM

## 2018-05-07 DIAGNOSIS — E7800 Pure hypercholesterolemia, unspecified: Secondary | ICD-10-CM | POA: Diagnosis not present

## 2018-05-07 DIAGNOSIS — J45990 Exercise induced bronchospasm: Secondary | ICD-10-CM | POA: Diagnosis not present

## 2018-05-07 DIAGNOSIS — Z8719 Personal history of other diseases of the digestive system: Secondary | ICD-10-CM | POA: Diagnosis not present

## 2018-05-07 DIAGNOSIS — E039 Hypothyroidism, unspecified: Secondary | ICD-10-CM | POA: Diagnosis not present

## 2018-05-07 DIAGNOSIS — K5900 Constipation, unspecified: Secondary | ICD-10-CM

## 2018-05-07 LAB — POCT URINALYSIS DIPSTICK
Bilirubin, UA: NEGATIVE
Blood, UA: NEGATIVE
Glucose, UA: NEGATIVE
KETONES UA: NEGATIVE
Leukocytes, UA: NEGATIVE
Nitrite, UA: NEGATIVE
Protein, UA: NEGATIVE
Spec Grav, UA: <=1.005 — AB (ref 1.010–1.025)
UROBILINOGEN UA: NEGATIVE U/dL — AB
pH, UA: 6 (ref 5.0–8.0)

## 2018-05-07 NOTE — Progress Notes (Signed)
Subjective:    Patient ID: Chelsea Cantrell, female    DOB: 06-17-1945, 72 y.o.   MRN: [id number]  HPI  72 year old Female for health maintenance exam and evaluation of medical issues.   Had Shingrix vaccine. Feels well except for some back pain on occasion treated with Aleve.  Had injury to right wrist, and hand doing pilates around Thanksgiving.  Had flu vaccine at [place] mid October.  Allergic to dogs cats pollen.  Takes allergy injections.  Saw Dr. Earlean Shawl recently and doing well with constipation.  Takes Linzess  History of hypothyroidism.  Has seen Dr. Sherwood Gambler about her back. One epidural steroid January 2019. Did not help. Pain returned after 3 weeks so she did not repeat.  Previously had epidural steroid injections in 2011 with multilevel degenerative disc disease at L3-L4 and L4-L5 by Dr. Alcide Evener  History of recurrent pancreatitis but has not had an episode in several years.  She had recurrent episodes in 2016.  Stent was placed in her bile/pancreatic duct at [facility] in June 2016 and she had a sphincterotomy.  This helped to control her episodes.  The stent was subsequently removed by Dr. Earlean Shawl.  No known drug allergies.  Intolerant of generic Wellbutrin  History of hypothyroidism, GE reflux, exercise-induced asthma, Schatzki's ring, allergic rhinitis, hyperplastic colon polyps, estrogen replacement, constipation.  History of spinal stenosis, seasonal affective disorder.  In 2003 was tried on Strattera for attention deficit disorder but currently does not take attention deficit medication.  Social history: Single, never married.  No children.  Retired as an Programme researcher, broadcasting/film/video for [employer].  Exercises routinely.  Did smoke prior to 1976 but currently non-smoker.  She is a vegetarian.  She occasionally drinks wine.  Family history: Father died at age 39 of lung cancer with history of MI at age 31.  Father also had hyperlipidemia.  Mother died at age 32 but  had a stroke 4 years before she passed away.  One brother and one sister in good health.        Review of Systems  Constitutional: Negative.   All other systems reviewed and are negative.      Objective:   Physical Exam Vitals signs reviewed.  Constitutional:      General: She is not in acute distress.    Appearance: Normal appearance.  HENT:     Head: Normocephalic and atraumatic.     Right Ear: Tympanic membrane normal.     Left Ear: Tympanic membrane normal.     Nose: Nose normal.     Mouth/Throat:     Mouth: Mucous membranes are moist.     Pharynx: Oropharynx is clear.  Eyes:     General: No scleral icterus.       Right eye: No discharge.        Left eye: No discharge.     Extraocular Movements: Extraocular movements intact.     Pupils: Pupils are equal, round, and reactive to light.  Neck:     Musculoskeletal: Neck supple.  Cardiovascular:     Rate and Rhythm: Normal rate and regular rhythm.     Pulses: Normal pulses.     Heart sounds: No murmur.  Pulmonary:     Effort: Pulmonary effort is normal. No respiratory distress.     Breath sounds: Normal breath sounds. No wheezing.  Abdominal:     General: Bowel sounds are normal.     Palpations: Abdomen is soft. There is no mass.  Tenderness: There is no guarding.  Genitourinary:    Comments: Deferred to GYN Lymphadenopathy:     Cervical: No cervical adenopathy.  Skin:    General: Skin is warm and dry.  Neurological:     General: No focal deficit present.     Mental Status: She is alert and oriented to person, place, and time.     Cranial Nerves: No cranial nerve deficit.     Coordination: Coordination normal.     Deep Tendon Reflexes: Reflexes normal.  Psychiatric:        Mood and Affect: Mood normal.        Behavior: Behavior normal.        Thought Content: Thought content normal.        Judgment: Judgment normal.           Assessment & Plan:  Recent history of low back pain that did not  respond to epidural steroid injection.  History of lumbar degenerative disc disease  Remote history of pancreatitis but no recent episodes status post stent placement several years ago which was subsequently removed  Constipation treated with Linzess  Hypothyroidism-TSH is normal on thyroid replacement  History of allergic rhinitis and exercise-induced asthma.  Seeing an allergist and takes allergy injections  History of osteoarthritis  Seasonal affective disorder  Plan: Return in 1 year or as needed.  Continue same medications  Subjective:   Patient presents for Medicare Annual/Subsequent preventive examination.  Review Past Medical/Family/Social: See above   Risk Factors  Current exercise habits: Exercises regularly at Gym Dietary issues discussed: Low-fat low carbohydrate as she is a vegetarian  Cardiac risk factors: MI at age 53 and father with hyperlipidemia; mother with history of stroke  Depression Screen  (Note: if answer to either of the following is "Yes", a more complete depression screening is indicated)   Over the past two weeks, have you felt down, depressed or hopeless? No  Over the past two weeks, have you felt little interest or pleasure in doing things? No Have you lost interest or pleasure in daily life? No Do you often feel hopeless? No Do you cry easily over simple problems? No   Activities of Daily Living  In your present state of health, do you have any difficulty performing the following activities?:   Driving? No  Managing money? No  Feeding yourself? No  Getting from bed to chair? No  Climbing a flight of stairs? No  Preparing food and eating?: No  Bathing or showering? No  Getting dressed: No  Getting to the toilet? No  Using the toilet:No  Moving around from place to place: No  In the past year have you fallen or had a near fall?:No  Are you sexually active? No  Do you have more than one partner? No   Hearing Difficulties: No  Do you  often ask people to speak up or repeat themselves? No  Do you experience ringing or noises in your ears?  Yes Do you have difficulty understanding soft or whispered voices? No  Do you feel that you have a problem with memory? No Do you often misplace items? No    Home Safety:  Do you have a smoke alarm at your residence? Yes Do you have grab bars in the bathroom?  Yes Do you have throw rugs in your house?  Yes   Cognitive Testing  Alert? Yes Normal Appearance?Yes  Oriented to person? Yes Place? Yes  Time? Yes  Recall of three objects?  Yes  Can perform simple calculations? Yes  Displays appropriate judgment?Yes  Can read the correct time from a watch face?Yes   List the Names of Other Physician/Practitioners you currently use:  See referral list for the physicians patient is currently seeing.  Dr. Earlean Shawl  Dr. Sherwood Gambler  GYN   Review of Systems: See above   Objective:     General appearance: Appears younger than stated age and trim Head: Normocephalic, without obvious abnormality, atraumatic  Eyes: conj clear, EOMi PEERLA  Ears: normal TM's and external ear canals both ears  Nose: Nares normal. Septum midline. Mucosa normal. No drainage or sinus tenderness.  Throat: lips, mucosa, and tongue normal; teeth and gums normal  Neck: no adenopathy, no carotid bruit, no JVD, supple, symmetrical, trachea midline and thyroid not enlarged, symmetric, no tenderness/mass/nodules  No CVA tenderness.  Lungs: clear to auscultation bilaterally  Breasts: normal appearance, no masses or tenderness Heart: regular rate and rhythm, S1, S2 normal, no murmur, click, rub or gallop  Abdomen: soft, non-tender; bowel sounds normal; no masses, no organomegaly  Musculoskeletal: ROM normal in all joints, no crepitus, no deformity, Normal muscle strengthen. Back  is symmetric, no curvature. Skin: Skin color, texture, turgor normal. No rashes or lesions  Lymph nodes: Cervical, supraclavicular, and  axillary nodes normal.  Neurologic: CN 2 -12 Normal, Normal symmetric reflexes. Normal coordination and gait  Psych: Alert & Oriented x 3, Mood appear stable.    Assessment:    Annual wellness medicare exam   Plan:    During the course of the visit the patient was educated and counseled about appropriate screening and preventive services including:   Annual flu vaccine     Patient Instructions (the written plan) was given to the patient.  Medicare Attestation  I have personally reviewed:  The patient's medical and social history  Their use of alcohol, tobacco or illicit drugs  Their current medications and supplements  The patient's functional ability including ADLs,fall risks, home safety risks, cognitive, and hearing and visual impairment  Diet and physical activities  Evidence for depression or mood disorders  The patient's weight, height, BMI, and visual acuity have been recorded in the chart. I have made referrals, counseling, and provided education to the patient based on review of the above and I have provided the patient with a written personalized care plan for preventive services.

## 2018-05-09 DIAGNOSIS — J3081 Allergic rhinitis due to animal (cat) (dog) hair and dander: Secondary | ICD-10-CM | POA: Diagnosis not present

## 2018-05-09 DIAGNOSIS — J301 Allergic rhinitis due to pollen: Secondary | ICD-10-CM | POA: Diagnosis not present

## 2018-05-09 DIAGNOSIS — J3089 Other allergic rhinitis: Secondary | ICD-10-CM | POA: Diagnosis not present

## 2018-05-22 ENCOUNTER — Encounter: Payer: Self-pay | Admitting: Internal Medicine

## 2018-05-22 NOTE — Patient Instructions (Signed)
It was a pleasure to see you today.  Continue same medications and return in 1 year or as needed. 

## 2018-05-30 DIAGNOSIS — J3089 Other allergic rhinitis: Secondary | ICD-10-CM | POA: Diagnosis not present

## 2018-05-30 DIAGNOSIS — J301 Allergic rhinitis due to pollen: Secondary | ICD-10-CM | POA: Diagnosis not present

## 2018-05-30 DIAGNOSIS — J3081 Allergic rhinitis due to animal (cat) (dog) hair and dander: Secondary | ICD-10-CM | POA: Diagnosis not present

## 2018-06-06 DIAGNOSIS — J3081 Allergic rhinitis due to animal (cat) (dog) hair and dander: Secondary | ICD-10-CM | POA: Diagnosis not present

## 2018-06-06 DIAGNOSIS — J3089 Other allergic rhinitis: Secondary | ICD-10-CM | POA: Diagnosis not present

## 2018-06-06 DIAGNOSIS — J301 Allergic rhinitis due to pollen: Secondary | ICD-10-CM | POA: Diagnosis not present

## 2018-06-22 ENCOUNTER — Encounter: Payer: Self-pay | Admitting: Internal Medicine

## 2018-06-22 DIAGNOSIS — J3081 Allergic rhinitis due to animal (cat) (dog) hair and dander: Secondary | ICD-10-CM | POA: Diagnosis not present

## 2018-06-22 DIAGNOSIS — Z1231 Encounter for screening mammogram for malignant neoplasm of breast: Secondary | ICD-10-CM | POA: Diagnosis not present

## 2018-06-22 DIAGNOSIS — J301 Allergic rhinitis due to pollen: Secondary | ICD-10-CM | POA: Diagnosis not present

## 2018-06-22 DIAGNOSIS — J3089 Other allergic rhinitis: Secondary | ICD-10-CM | POA: Diagnosis not present

## 2018-06-22 LAB — HM MAMMOGRAPHY

## 2018-07-10 DIAGNOSIS — J3089 Other allergic rhinitis: Secondary | ICD-10-CM | POA: Diagnosis not present

## 2018-07-10 DIAGNOSIS — J301 Allergic rhinitis due to pollen: Secondary | ICD-10-CM | POA: Diagnosis not present

## 2018-07-10 DIAGNOSIS — J3081 Allergic rhinitis due to animal (cat) (dog) hair and dander: Secondary | ICD-10-CM | POA: Diagnosis not present

## 2018-07-11 DIAGNOSIS — J3081 Allergic rhinitis due to animal (cat) (dog) hair and dander: Secondary | ICD-10-CM | POA: Diagnosis not present

## 2018-07-11 DIAGNOSIS — J301 Allergic rhinitis due to pollen: Secondary | ICD-10-CM | POA: Diagnosis not present

## 2018-07-11 DIAGNOSIS — J3089 Other allergic rhinitis: Secondary | ICD-10-CM | POA: Diagnosis not present

## 2018-07-11 DIAGNOSIS — J4520 Mild intermittent asthma, uncomplicated: Secondary | ICD-10-CM | POA: Diagnosis not present

## 2018-07-18 DIAGNOSIS — J3089 Other allergic rhinitis: Secondary | ICD-10-CM | POA: Diagnosis not present

## 2018-07-18 DIAGNOSIS — J301 Allergic rhinitis due to pollen: Secondary | ICD-10-CM | POA: Diagnosis not present

## 2018-07-18 DIAGNOSIS — J3081 Allergic rhinitis due to animal (cat) (dog) hair and dander: Secondary | ICD-10-CM | POA: Diagnosis not present

## 2018-07-19 DIAGNOSIS — H04123 Dry eye syndrome of bilateral lacrimal glands: Secondary | ICD-10-CM | POA: Diagnosis not present

## 2018-07-19 DIAGNOSIS — Z9889 Other specified postprocedural states: Secondary | ICD-10-CM | POA: Diagnosis not present

## 2018-07-19 DIAGNOSIS — H2513 Age-related nuclear cataract, bilateral: Secondary | ICD-10-CM | POA: Diagnosis not present

## 2018-07-19 DIAGNOSIS — H401111 Primary open-angle glaucoma, right eye, mild stage: Secondary | ICD-10-CM | POA: Diagnosis not present

## 2018-07-19 DIAGNOSIS — H401122 Primary open-angle glaucoma, left eye, moderate stage: Secondary | ICD-10-CM | POA: Diagnosis not present

## 2018-08-01 DIAGNOSIS — J301 Allergic rhinitis due to pollen: Secondary | ICD-10-CM | POA: Diagnosis not present

## 2018-08-01 DIAGNOSIS — J3089 Other allergic rhinitis: Secondary | ICD-10-CM | POA: Diagnosis not present

## 2018-08-01 DIAGNOSIS — J3081 Allergic rhinitis due to animal (cat) (dog) hair and dander: Secondary | ICD-10-CM | POA: Diagnosis not present

## 2018-08-17 DIAGNOSIS — J301 Allergic rhinitis due to pollen: Secondary | ICD-10-CM | POA: Diagnosis not present

## 2018-08-17 DIAGNOSIS — J3081 Allergic rhinitis due to animal (cat) (dog) hair and dander: Secondary | ICD-10-CM | POA: Diagnosis not present

## 2018-08-17 DIAGNOSIS — J3089 Other allergic rhinitis: Secondary | ICD-10-CM | POA: Diagnosis not present

## 2018-08-23 ENCOUNTER — Telehealth: Payer: Self-pay

## 2018-08-23 ENCOUNTER — Other Ambulatory Visit: Payer: Self-pay

## 2018-08-23 ENCOUNTER — Ambulatory Visit (INDEPENDENT_AMBULATORY_CARE_PROVIDER_SITE_OTHER): Payer: Medicare Other | Admitting: Internal Medicine

## 2018-08-23 ENCOUNTER — Encounter: Payer: Self-pay | Admitting: Internal Medicine

## 2018-08-23 VITALS — BP 120/80 | HR 80 | Temp 98.3°F

## 2018-08-23 DIAGNOSIS — S91052A Open bite, left ankle, initial encounter: Secondary | ICD-10-CM

## 2018-08-23 DIAGNOSIS — W540XXA Bitten by dog, initial encounter: Secondary | ICD-10-CM

## 2018-08-23 DIAGNOSIS — Z23 Encounter for immunization: Secondary | ICD-10-CM | POA: Diagnosis not present

## 2018-08-23 DIAGNOSIS — S81851A Open bite, right lower leg, initial encounter: Secondary | ICD-10-CM | POA: Diagnosis not present

## 2018-08-23 DIAGNOSIS — S41152A Open bite of left upper arm, initial encounter: Secondary | ICD-10-CM | POA: Diagnosis not present

## 2018-08-23 MED ORDER — DOXYCYCLINE HYCLATE 100 MG PO TABS: 100.0000 mg | ORAL_TABLET | Freq: Two times a day (BID) | ORAL | 0 refills | Status: DC

## 2018-08-23 MED ORDER — MUPIROCIN 2 % EX OINT: TOPICAL_OINTMENT | CUTANEOUS | 1 refills | Status: DC

## 2018-08-23 NOTE — Patient Instructions (Addendum)
Tetanus immunization update given.  Clean dog bites with warm soapy water and peroxide then apply Bactroban twice daily and wrapped with Telfa and Kling.  Follow-up on Monday, April 6 or sooner if worse.  Doxycycline 100 mg twice daily for 10 days.

## 2018-08-23 NOTE — Telephone Encounter (Signed)
Patient called she was bit by her dog last evening and she has three bites one on her wrist and ankles. Her last TDAP was 03/04/10 she said she was told that she needed to call you and be seeing and possibly getting some antibiotics. Please advise.

## 2018-08-23 NOTE — Telephone Encounter (Signed)
Attempted Virtual Visit, patient now coming in for visit

## 2018-08-23 NOTE — Progress Notes (Signed)
   Subjective:    Patient ID: Chelsea Cantrell, female    DOB: [birth year], 73 y.o.   MRN: [id number]  HPI Patient was bitten by her dog on April 1.  Dog had Rabies booster last week.  Dog had anxiety issues.  She went to get him out of the kennel and he attacked her and multiple areas.  Patient says after conferring with Animal Control and her veterinarian, the decision was made to euthanize the dog.  Pt had tetanus vaccine October 2011.  This was updated today.  We attempted to connect by virtual visit but video noted failed and I asked her to come to the office so I could get a look at the bites.    Review of Systems she is not had any fever or shaking chills.  Does not feel that the wounds are worse today except one is draining serous fluid     Objective:   Physical Exam She is afebrile temperature 98.3 degrees orally  She has an avulsed tip of her fingernail right third finger.  Dorsal right foot has erythema along the first metatarsal area.  She has 2 bites right lower leg.  One has serosanguineous fluid exuding from it.  These appear to be fairly clean and are not erythematous.  No pus is noted.  The medial wound that has a serosanguineous fluid is deeper.  Left arm has 2 bites that are not very deep to bites in the mid left leg better not very deep         Assessment & Plan:  Multiple dog bites the worst is the right lower leg and there is some erythema extending from them over the dorsum of the first metatarsal which could be an early cellulitis.  Tetanus immunization update given.  Started on doxycycline 100 mg twice daily for 10 days.  These wounds were dressed in a sterile fashion today.  Bactroban ointment was prescribed.  She will clean them with warm soapy water, apply peroxide and Bactroban twice daily.  I will see her again on April 6 or sooner if worse.

## 2018-08-23 NOTE — Telephone Encounter (Signed)
I need date of dog's rabies shot. Set up virtual visit.

## 2018-08-23 NOTE — Telephone Encounter (Signed)
She said her dog got a booster 2 weeks ago and animal control took the dog yesterday.

## 2018-08-27 ENCOUNTER — Encounter: Payer: Self-pay | Admitting: Internal Medicine

## 2018-08-27 ENCOUNTER — Ambulatory Visit (INDEPENDENT_AMBULATORY_CARE_PROVIDER_SITE_OTHER): Payer: Medicare Other | Admitting: Internal Medicine

## 2018-08-27 ENCOUNTER — Other Ambulatory Visit: Payer: Self-pay

## 2018-08-27 VITALS — BP 120/70 | HR 68 | Temp 98.3°F | Ht 67.0 in | Wt 132.0 lb

## 2018-08-27 DIAGNOSIS — W540XXA Bitten by dog, initial encounter: Secondary | ICD-10-CM

## 2018-08-27 DIAGNOSIS — S81852D Open bite, left lower leg, subsequent encounter: Secondary | ICD-10-CM

## 2018-08-27 DIAGNOSIS — W540XXD Bitten by dog, subsequent encounter: Secondary | ICD-10-CM

## 2018-08-27 DIAGNOSIS — S91051D Open bite, right ankle, subsequent encounter: Secondary | ICD-10-CM | POA: Diagnosis not present

## 2018-08-27 DIAGNOSIS — S41152A Open bite of left upper arm, initial encounter: Secondary | ICD-10-CM

## 2018-08-27 NOTE — Patient Instructions (Signed)
Continue doxycycline as prescribed.  Touch base with me on Thursday and give progress report regarding right ankle wound which is somewhat erythematous.  Continue local care as previously prescribed.  May try warm moist compresses 20 minutes twice daily to right ankle.

## 2018-09-05 DIAGNOSIS — J301 Allergic rhinitis due to pollen: Secondary | ICD-10-CM | POA: Diagnosis not present

## 2018-09-05 DIAGNOSIS — J3089 Other allergic rhinitis: Secondary | ICD-10-CM | POA: Diagnosis not present

## 2018-09-05 DIAGNOSIS — J3081 Allergic rhinitis due to animal (cat) (dog) hair and dander: Secondary | ICD-10-CM | POA: Diagnosis not present

## 2018-09-15 NOTE — Progress Notes (Signed)
   Subjective:    Patient ID: Chelsea Cantrell, female    DOB: 01-07-1946, 73 y.o.   MRN: [id number]  HPI 73 year old female was seen on April 2 with multiple dog bites from a dog she underwent on April 1.  Rabies vaccine was up-to-date.  She had multiple injuries including an avulsed tip of her fingernail right third finger.  Dorsal right foot and erythema along first metatarsal and she had 2 bites on the right lower leg 1 of which had serosanguineous fluid exuding from it.  There was a medial leg wound that was deeper and had serosanguineous fluid oozing from that as well.  She had 2 left arm bites that were not as deep as the leg wounds.  These wounds were cleaned and dressed in sterile fashion and Bactroban ointment was applied and prescribed for home use.  She was taught how to take care of these wounds using warm soapy water, peroxide and Bactroban twice daily.  She is here now for follow-up.  She was placed on doxycycline 100 mg twice daily for 10 days.  Tetanus immunization update was given.  Reports no fever or shaking chills.  Feels that wounds are beginning to heal.    Review of Systems     Objective:   Physical Exam  Worst wounds are on  right lower leg.  There were 2 of these wounds and they have responded well to local treatment and oral antibiotics.  Still has erythema around right ankle wound.  She is taking good care of these wounds.    Left arm bites are clean with no evidence of cellulitis.  Still has avulsion tip of her fingernail right third finger course      Assessment & Plan:  Multiple dog bites right foot, right lower leg, left arm and right third finger-doing well with local care and oral antibiotics  Plan: Patient will finish 10-day course of doxycycline continue local care until these wounds are completely healed.  I asked that she touch base with me on Thursday and give progress report regarding right ankle wound which is somewhat erythematous.  She may use warm  moist compress to that area 20 minutes twice daily.

## 2018-09-18 DIAGNOSIS — J301 Allergic rhinitis due to pollen: Secondary | ICD-10-CM | POA: Diagnosis not present

## 2018-09-18 DIAGNOSIS — J3089 Other allergic rhinitis: Secondary | ICD-10-CM | POA: Diagnosis not present

## 2018-09-18 DIAGNOSIS — J3081 Allergic rhinitis due to animal (cat) (dog) hair and dander: Secondary | ICD-10-CM | POA: Diagnosis not present

## 2018-09-21 DIAGNOSIS — J3081 Allergic rhinitis due to animal (cat) (dog) hair and dander: Secondary | ICD-10-CM | POA: Diagnosis not present

## 2018-09-21 DIAGNOSIS — J3089 Other allergic rhinitis: Secondary | ICD-10-CM | POA: Diagnosis not present

## 2018-09-21 DIAGNOSIS — J301 Allergic rhinitis due to pollen: Secondary | ICD-10-CM | POA: Diagnosis not present

## 2018-09-26 DIAGNOSIS — J3081 Allergic rhinitis due to animal (cat) (dog) hair and dander: Secondary | ICD-10-CM | POA: Diagnosis not present

## 2018-09-26 DIAGNOSIS — J301 Allergic rhinitis due to pollen: Secondary | ICD-10-CM | POA: Diagnosis not present

## 2018-09-26 DIAGNOSIS — J3089 Other allergic rhinitis: Secondary | ICD-10-CM | POA: Diagnosis not present

## 2018-09-28 DIAGNOSIS — J3081 Allergic rhinitis due to animal (cat) (dog) hair and dander: Secondary | ICD-10-CM | POA: Diagnosis not present

## 2018-09-28 DIAGNOSIS — J301 Allergic rhinitis due to pollen: Secondary | ICD-10-CM | POA: Diagnosis not present

## 2018-09-28 DIAGNOSIS — J3089 Other allergic rhinitis: Secondary | ICD-10-CM | POA: Diagnosis not present

## 2018-10-03 DIAGNOSIS — J301 Allergic rhinitis due to pollen: Secondary | ICD-10-CM | POA: Diagnosis not present

## 2018-10-03 DIAGNOSIS — J3081 Allergic rhinitis due to animal (cat) (dog) hair and dander: Secondary | ICD-10-CM | POA: Diagnosis not present

## 2018-10-03 DIAGNOSIS — J3089 Other allergic rhinitis: Secondary | ICD-10-CM | POA: Diagnosis not present

## 2018-10-17 DIAGNOSIS — Z96641 Presence of right artificial hip joint: Secondary | ICD-10-CM | POA: Diagnosis not present

## 2018-10-17 DIAGNOSIS — M1611 Unilateral primary osteoarthritis, right hip: Secondary | ICD-10-CM | POA: Diagnosis not present

## 2018-10-17 DIAGNOSIS — Z96649 Presence of unspecified artificial hip joint: Secondary | ICD-10-CM | POA: Diagnosis not present

## 2018-10-17 DIAGNOSIS — M25551 Pain in right hip: Secondary | ICD-10-CM | POA: Diagnosis not present

## 2018-10-18 DIAGNOSIS — J3089 Other allergic rhinitis: Secondary | ICD-10-CM | POA: Diagnosis not present

## 2018-10-18 DIAGNOSIS — J3081 Allergic rhinitis due to animal (cat) (dog) hair and dander: Secondary | ICD-10-CM | POA: Diagnosis not present

## 2018-10-18 DIAGNOSIS — J301 Allergic rhinitis due to pollen: Secondary | ICD-10-CM | POA: Diagnosis not present

## 2018-10-31 DIAGNOSIS — J3089 Other allergic rhinitis: Secondary | ICD-10-CM | POA: Diagnosis not present

## 2018-10-31 DIAGNOSIS — J301 Allergic rhinitis due to pollen: Secondary | ICD-10-CM | POA: Diagnosis not present

## 2018-10-31 DIAGNOSIS — J3081 Allergic rhinitis due to animal (cat) (dog) hair and dander: Secondary | ICD-10-CM | POA: Diagnosis not present

## 2018-11-02 ENCOUNTER — Telehealth: Payer: Self-pay | Admitting: Internal Medicine

## 2018-11-02 MED ORDER — SYNTHROID 50 MCG PO TABS: 50.0000 ug | ORAL_TABLET | Freq: Every day | ORAL | 1 refills | Status: DC

## 2018-11-02 NOTE — Telephone Encounter (Signed)
Received Fax RX request from  [facility] 50 MCG tablet    Last Refill - 08/04/18  Last OV - 08/27/18  Last CPE - 05/07/18

## 2018-11-14 DIAGNOSIS — J3089 Other allergic rhinitis: Secondary | ICD-10-CM | POA: Diagnosis not present

## 2018-11-14 DIAGNOSIS — J301 Allergic rhinitis due to pollen: Secondary | ICD-10-CM | POA: Diagnosis not present

## 2018-11-14 DIAGNOSIS — J3081 Allergic rhinitis due to animal (cat) (dog) hair and dander: Secondary | ICD-10-CM | POA: Diagnosis not present

## 2018-11-29 DIAGNOSIS — J3081 Allergic rhinitis due to animal (cat) (dog) hair and dander: Secondary | ICD-10-CM | POA: Diagnosis not present

## 2018-11-29 DIAGNOSIS — J301 Allergic rhinitis due to pollen: Secondary | ICD-10-CM | POA: Diagnosis not present

## 2018-11-29 DIAGNOSIS — J3089 Other allergic rhinitis: Secondary | ICD-10-CM | POA: Diagnosis not present

## 2018-12-12 DIAGNOSIS — J301 Allergic rhinitis due to pollen: Secondary | ICD-10-CM | POA: Diagnosis not present

## 2018-12-12 DIAGNOSIS — J3081 Allergic rhinitis due to animal (cat) (dog) hair and dander: Secondary | ICD-10-CM | POA: Diagnosis not present

## 2018-12-12 DIAGNOSIS — J3089 Other allergic rhinitis: Secondary | ICD-10-CM | POA: Diagnosis not present

## 2018-12-26 DIAGNOSIS — J301 Allergic rhinitis due to pollen: Secondary | ICD-10-CM | POA: Diagnosis not present

## 2018-12-26 DIAGNOSIS — J3089 Other allergic rhinitis: Secondary | ICD-10-CM | POA: Diagnosis not present

## 2018-12-26 DIAGNOSIS — J3081 Allergic rhinitis due to animal (cat) (dog) hair and dander: Secondary | ICD-10-CM | POA: Diagnosis not present

## 2018-12-31 ENCOUNTER — Telehealth: Payer: Self-pay | Admitting: Internal Medicine

## 2018-12-31 NOTE — Telephone Encounter (Signed)
LVM to CB and schedule CPE due after 05/08/19

## 2019-01-09 DIAGNOSIS — J3089 Other allergic rhinitis: Secondary | ICD-10-CM | POA: Diagnosis not present

## 2019-01-09 DIAGNOSIS — J301 Allergic rhinitis due to pollen: Secondary | ICD-10-CM | POA: Diagnosis not present

## 2019-01-09 DIAGNOSIS — J3081 Allergic rhinitis due to animal (cat) (dog) hair and dander: Secondary | ICD-10-CM | POA: Diagnosis not present

## 2019-01-17 DIAGNOSIS — H2513 Age-related nuclear cataract, bilateral: Secondary | ICD-10-CM | POA: Diagnosis not present

## 2019-01-17 DIAGNOSIS — H04123 Dry eye syndrome of bilateral lacrimal glands: Secondary | ICD-10-CM | POA: Diagnosis not present

## 2019-01-17 DIAGNOSIS — H401131 Primary open-angle glaucoma, bilateral, mild stage: Secondary | ICD-10-CM | POA: Diagnosis not present

## 2019-01-23 DIAGNOSIS — J3081 Allergic rhinitis due to animal (cat) (dog) hair and dander: Secondary | ICD-10-CM | POA: Diagnosis not present

## 2019-01-23 DIAGNOSIS — J3089 Other allergic rhinitis: Secondary | ICD-10-CM | POA: Diagnosis not present

## 2019-01-23 DIAGNOSIS — J301 Allergic rhinitis due to pollen: Secondary | ICD-10-CM | POA: Diagnosis not present

## 2019-02-06 DIAGNOSIS — J301 Allergic rhinitis due to pollen: Secondary | ICD-10-CM | POA: Diagnosis not present

## 2019-02-06 DIAGNOSIS — J3089 Other allergic rhinitis: Secondary | ICD-10-CM | POA: Diagnosis not present

## 2019-02-06 DIAGNOSIS — J3081 Allergic rhinitis due to animal (cat) (dog) hair and dander: Secondary | ICD-10-CM | POA: Diagnosis not present

## 2019-02-08 DIAGNOSIS — Z23 Encounter for immunization: Secondary | ICD-10-CM | POA: Diagnosis not present

## 2019-02-21 DIAGNOSIS — J3089 Other allergic rhinitis: Secondary | ICD-10-CM | POA: Diagnosis not present

## 2019-02-21 DIAGNOSIS — J3081 Allergic rhinitis due to animal (cat) (dog) hair and dander: Secondary | ICD-10-CM | POA: Diagnosis not present

## 2019-02-21 DIAGNOSIS — J301 Allergic rhinitis due to pollen: Secondary | ICD-10-CM | POA: Diagnosis not present

## 2019-02-27 ENCOUNTER — Encounter: Payer: Self-pay | Admitting: Gynecology

## 2019-03-07 DIAGNOSIS — J301 Allergic rhinitis due to pollen: Secondary | ICD-10-CM | POA: Diagnosis not present

## 2019-03-07 DIAGNOSIS — J3089 Other allergic rhinitis: Secondary | ICD-10-CM | POA: Diagnosis not present

## 2019-03-07 DIAGNOSIS — J3081 Allergic rhinitis due to animal (cat) (dog) hair and dander: Secondary | ICD-10-CM | POA: Diagnosis not present

## 2019-03-20 DIAGNOSIS — J301 Allergic rhinitis due to pollen: Secondary | ICD-10-CM | POA: Diagnosis not present

## 2019-03-20 DIAGNOSIS — J3089 Other allergic rhinitis: Secondary | ICD-10-CM | POA: Diagnosis not present

## 2019-03-20 DIAGNOSIS — J3081 Allergic rhinitis due to animal (cat) (dog) hair and dander: Secondary | ICD-10-CM | POA: Diagnosis not present

## 2019-04-03 DIAGNOSIS — J3089 Other allergic rhinitis: Secondary | ICD-10-CM | POA: Diagnosis not present

## 2019-04-03 DIAGNOSIS — J301 Allergic rhinitis due to pollen: Secondary | ICD-10-CM | POA: Diagnosis not present

## 2019-04-03 DIAGNOSIS — J3081 Allergic rhinitis due to animal (cat) (dog) hair and dander: Secondary | ICD-10-CM | POA: Diagnosis not present

## 2019-04-17 ENCOUNTER — Other Ambulatory Visit: Payer: Self-pay

## 2019-04-17 DIAGNOSIS — J3081 Allergic rhinitis due to animal (cat) (dog) hair and dander: Secondary | ICD-10-CM | POA: Diagnosis not present

## 2019-04-17 DIAGNOSIS — J301 Allergic rhinitis due to pollen: Secondary | ICD-10-CM | POA: Diagnosis not present

## 2019-04-17 DIAGNOSIS — J3089 Other allergic rhinitis: Secondary | ICD-10-CM | POA: Diagnosis not present

## 2019-05-07 ENCOUNTER — Other Ambulatory Visit: Payer: Medicare Other | Admitting: Internal Medicine

## 2019-05-07 ENCOUNTER — Other Ambulatory Visit: Payer: Self-pay

## 2019-05-07 DIAGNOSIS — Z0000 Encounter for general adult medical examination without abnormal findings: Secondary | ICD-10-CM

## 2019-05-07 DIAGNOSIS — Z1329 Encounter for screening for other suspected endocrine disorder: Secondary | ICD-10-CM

## 2019-05-07 DIAGNOSIS — J45990 Exercise induced bronchospasm: Secondary | ICD-10-CM

## 2019-05-07 DIAGNOSIS — F338 Other recurrent depressive disorders: Secondary | ICD-10-CM

## 2019-05-07 DIAGNOSIS — E7800 Pure hypercholesterolemia, unspecified: Secondary | ICD-10-CM

## 2019-05-07 DIAGNOSIS — M7918 Myalgia, other site: Secondary | ICD-10-CM

## 2019-05-07 NOTE — Addendum Note (Signed)
Addended by: Mady Haagensen on: 05/07/2019 09:24 AM   Modules accepted: Orders

## 2019-05-08 LAB — COMPLETE METABOLIC PANEL WITH GFR
AG Ratio: 2.6 (calc) — ABNORMAL HIGH (ref 1.0–2.5)
ALT: 21 U/L (ref 6–29)
AST: 27 U/L (ref 10–35)
Albumin: 4.6 g/dL (ref 3.6–5.1)
Alkaline phosphatase (APISO): 54 U/L (ref 37–153)
BUN: 15 mg/dL (ref 7–25)
CO2: 27 mmol/L (ref 20–32)
Calcium: 9.6 mg/dL (ref 8.6–10.4)
Chloride: 105 mmol/L (ref 98–110)
Creat: 0.88 mg/dL (ref 0.60–0.93)
GFR, Est African American: 77 mL/min/{1.73_m2} (ref >=60)
GFR, Est Non African American: 66 mL/min/{1.73_m2} (ref >=60)
Globulin: 1.8 g/dL (calc) — ABNORMAL LOW (ref 1.9–3.7)
Glucose, Bld: 90 mg/dL (ref 65–99)
Potassium: 4.7 mmol/L (ref 3.5–5.3)
Sodium: 139 mmol/L (ref 135–146)
Total Bilirubin: 0.7 mg/dL (ref 0.2–1.2)
Total Protein: 6.4 g/dL (ref 6.1–8.1)

## 2019-05-08 LAB — CBC WITH DIFFERENTIAL/PLATELET
Absolute Monocytes: 340 cells/uL (ref 200–950)
Basophils Absolute: 60 cells/uL (ref 0–200)
Basophils Relative: 1.5 %
Eosinophils Absolute: 340 cells/uL (ref 15–500)
Eosinophils Relative: 8.5 %
HCT: 42.4 % (ref 35.0–45.0)
Hemoglobin: 14.6 g/dL (ref 11.7–15.5)
Lymphs Abs: 816 cells/uL — ABNORMAL LOW (ref 850–3900)
MCH: 29.7 pg (ref 27.0–33.0)
MCHC: 34.4 g/dL (ref 32.0–36.0)
MCV: 86.2 fL (ref 80.0–100.0)
MPV: 9.1 fL (ref 7.5–12.5)
Monocytes Relative: 8.5 %
Neutro Abs: 2444 cells/uL (ref 1500–7800)
Neutrophils Relative %: 61.1 %
Platelets: 310 10*3/uL (ref 140–400)
RBC: 4.92 10*6/uL (ref 3.80–5.10)
RDW: 12.5 % (ref 11.0–15.0)
Total Lymphocyte: 20.4 %
WBC: 4 10*3/uL (ref 3.8–10.8)

## 2019-05-08 LAB — LIPID PANEL
Cholesterol: 203 mg/dL — ABNORMAL HIGH (ref <200)
HDL: 81 mg/dL (ref >=50)
LDL Cholesterol (Calc): 107 mg/dL (calc) — ABNORMAL HIGH
Non-HDL Cholesterol (Calc): 122 mg/dL (calc) (ref <130)
Total CHOL/HDL Ratio: 2.5 (calc) (ref <5.0)
Triglycerides: 68 mg/dL (ref <150)

## 2019-05-08 LAB — TSH: TSH: 3.52 mIU/L (ref 0.40–4.50)

## 2019-05-09 ENCOUNTER — Encounter: Payer: Self-pay | Admitting: Internal Medicine

## 2019-05-09 ENCOUNTER — Ambulatory Visit (INDEPENDENT_AMBULATORY_CARE_PROVIDER_SITE_OTHER): Payer: Medicare Other | Admitting: Internal Medicine

## 2019-05-09 ENCOUNTER — Other Ambulatory Visit: Payer: Self-pay

## 2019-05-09 VITALS — BP 110/60 | HR 57 | Temp 98.0°F | Ht 67.0 in | Wt 137.0 lb

## 2019-05-09 DIAGNOSIS — E039 Hypothyroidism, unspecified: Secondary | ICD-10-CM

## 2019-05-09 DIAGNOSIS — J45990 Exercise induced bronchospasm: Secondary | ICD-10-CM | POA: Diagnosis not present

## 2019-05-09 DIAGNOSIS — Z23 Encounter for immunization: Secondary | ICD-10-CM | POA: Diagnosis not present

## 2019-05-09 DIAGNOSIS — Z8719 Personal history of other diseases of the digestive system: Secondary | ICD-10-CM

## 2019-05-09 DIAGNOSIS — Z0000 Encounter for general adult medical examination without abnormal findings: Secondary | ICD-10-CM | POA: Diagnosis not present

## 2019-05-09 DIAGNOSIS — K5900 Constipation, unspecified: Secondary | ICD-10-CM | POA: Diagnosis not present

## 2019-05-09 DIAGNOSIS — J3081 Allergic rhinitis due to animal (cat) (dog) hair and dander: Secondary | ICD-10-CM

## 2019-05-09 DIAGNOSIS — M5136 Other intervertebral disc degeneration, lumbar region: Secondary | ICD-10-CM

## 2019-05-09 DIAGNOSIS — Z789 Other specified health status: Secondary | ICD-10-CM

## 2019-05-09 DIAGNOSIS — R8290 Unspecified abnormal findings in urine: Secondary | ICD-10-CM | POA: Diagnosis not present

## 2019-05-09 LAB — POCT URINALYSIS DIPSTICK
Appearance: NEGATIVE
Bilirubin, UA: NEGATIVE
Blood, UA: NEGATIVE
Glucose, UA: NEGATIVE
Ketones, UA: NEGATIVE
Nitrite, UA: NEGATIVE
Odor: NEGATIVE
Protein, UA: NEGATIVE
Spec Grav, UA: 1.015 (ref 1.010–1.025)
Urobilinogen, UA: 0.2 E.U./dL
pH, UA: 6.5 (ref 5.0–8.0)

## 2019-05-09 NOTE — Progress Notes (Signed)
Subjective:    Patient ID: Chelsea Cantrell, female    DOB: 11/22/1945, 73 y.o.   MRN: [id number]  HPI  73 year old Female for health maintenance exam and evaluation of medical issues. No new complaints.  Immunizations: Has had both Zostavax and Shingrix vaccines. Had flu vaccine in October. Given prevnar 23 today. Had had pneumococcal 23. Got Tdap in April.  She is followed at [place]. She is allergic to dogs cats and pollen. She takes allergy injections.  History of constipation. Followed by Dr. Earlean Shawl. Takes Linzess.  History of hypothyroidism.  History of recurrent pancreatitis but has not had an episode in several years. She had recurrent episodes in 2016. The stent was placed in her bile/pancreatic duct at [facility] June 2016 and she had a sphincterotomy. The stent was subsequently removed by Dr. Earlean Shawl and she has had no further issues.   Drug allergies.  Intolerant of generic Wellbutrin.  History of GE reflux, exercise-induced asthma, Schatzki's ring, hyperplastic colon polyps. She is on estrogen replacement.  History of spinal stenosis, seasonal affective disorder. 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 Jimmye Norman. Has seen Dr. Valere Dross 1 back for back and had an epidural steroid injection January 2019 but really did not feel. The pain returned after 3 weeks and she did not repeat the injection.  She was tried on Strattera in 2003 for attention deficit disorder but currently does not take medication for attention deficit.  Social history: Single, never married. No children. Retired as an Biomedical engineer. Exercises routinely. Did smoke prior to 1976 but currently non-smoker. She is a vegetarian. She basically drinks wine.  Family history: Father died at age 17 of lung cancer with history of MI at age 31. Father also has hyperlipidemia. Mother died at age 66 but had a stroke 4  years before she passed away. 1 brother and 1 sister in good health.    Review of Systems  Respiratory: Negative.   Gastrointestinal:       History of constipation treated with Linzess  Genitourinary: Negative.   Musculoskeletal: Negative.   Neurological: Negative.   Psychiatric/Behavioral: Negative.        Objective:   Physical Exam Vitals reviewed.  Constitutional:      General: She is not in acute distress.    Appearance: Normal appearance. She is not diaphoretic.  HENT:     Head: Normocephalic and atraumatic.     Right Ear: Tympanic membrane and ear canal normal.     Left Ear: Tympanic membrane and ear canal normal.     Mouth/Throat:     Mouth: Mucous membranes are moist.     Pharynx: Oropharynx is clear.  Eyes:     General: No scleral icterus.       Right eye: No discharge.        Left eye: No discharge.     Extraocular Movements: Extraocular movements intact.     Conjunctiva/sclera: Conjunctivae normal.     Pupils: Pupils are equal, round, and reactive to light.  Neck:     Vascular: No carotid bruit.  Cardiovascular:     Rate and Rhythm: Normal rate and regular rhythm.     Pulses: Normal pulses.     Heart sounds: Normal heart sounds. No murmur.  Pulmonary:     Effort: Pulmonary effort is normal. No respiratory distress.     Breath sounds: Normal breath sounds. No  wheezing or rales.  Abdominal:     General: Bowel sounds are normal.     Palpations: Abdomen is soft. There is no mass.     Tenderness: There is no guarding or rebound.  Genitourinary:    Comments: Deferred to GYN Musculoskeletal:     Cervical back: Neck supple. No rigidity.     Right lower leg: No edema.     Left lower leg: No edema.  Lymphadenopathy:     Cervical: No cervical adenopathy.  Skin:    General: Skin is warm and dry.     Findings: No lesion or rash.  Neurological:     General: No focal deficit present.     Mental Status: She is alert and oriented to person, place, and time.    Psychiatric:        Mood and Affect: Mood normal.        Behavior: Behavior normal.        Thought Content: Thought content normal.        Judgment: Judgment normal.           Assessment & Plan:  Normal health maintenance exam  Remote history of pancreatitis but no recent episodes status post stent placement several years ago. Stent was subsequently removed and she had sphincterotomy.  Constipation treated successfully with Linzess  Hypothyroidism-TSH normal on thyroid replacement  History of allergic rhinitis and exercise-induced asthma. Sees allergist and takes allergy injections.  History of seasonal affective disorder. Currently on no medication for this.  Plan: Return in 1 year or as needed and continue current medications.  Subjective:   Patient presents for Medicare Annual/Subsequent preventive examination.  Review Past Medical/Family/Social: See above   Risk Factors  Current exercise habits: Exercises regularly  dietary issues discussed: She is a vegetarian  Cardiac risk factors: Father with history of MI  Depression Screen  (Note: if answer to either of the following is "Yes", a more complete depression screening is indicated)   Over the past two weeks, have you felt down, depressed or hopeless? No  Over the past two weeks, have you felt little interest or pleasure in doing things? Yes due to the pandemic Have you lost interest or pleasure in daily life? No Do you often feel hopeless? No Do you cry easily over simple problems? No   Activities of Daily Living  In your present state of health, do you have any difficulty performing the following activities?:   Driving? No  Managing money? No  Feeding yourself? No  Getting from bed to chair? No  Climbing a flight of stairs? No  Preparing food and eating?: No  Bathing or showering? No  Getting dressed: No  Getting to the toilet? No  Using the toilet:No  Moving around from place to place: No  In the  past year have you fallen or had a near fall?:No  Are you sexually active? No  Do you have more than one partner? No   Hearing Difficulties: No  Do you often ask people to speak up or repeat themselves? No  Do you experience ringing or noises in your ears? Yes Do you have difficulty understanding soft or whispered voices? No  Do you feel that you have a problem with memory? Sometimes Do you often misplace items? No    Home Safety:  Do you have a smoke alarm at your residence? Yes Do you have grab bars in the bathroom? Yes Do you have throw rugs in your house? None  Cognitive Testing  Alert? Yes Normal Appearance?Yes  Oriented to person? Yes Place? Yes  Time? Yes  Recall of three objects? Yes  Can perform simple calculations? Yes  Displays appropriate judgment?Yes  Can read the correct time from a watch face?Yes   List the Names of Other Physician/Practitioners you currently use:  See referral list for the physicians patient is currently seeing.   Allergist Review of Systems: See above   Objective:     General appearance: Appears younger than stated age and trim Head: Normocephalic, without obvious abnormality, atraumatic  Eyes: conj clear, EOMi PEERLA  Ears: normal TM's and external ear canals both ears  Nose: Nares normal. Septum midline. Mucosa normal. No drainage or sinus tenderness.  Throat: lips, mucosa, and tongue normal; teeth and gums normal  Neck: no adenopathy, no carotid bruit, no JVD, supple, symmetrical, trachea midline and thyroid not enlarged, symmetric, no tenderness/mass/nodules  No CVA tenderness.  Lungs: clear to auscultation bilaterally  Breasts: normal appearance, no masses or tenderness Heart: regular rate and rhythm, S1, S2 normal, no murmur, click, rub or gallop  Abdomen: soft, non-tender; bowel sounds normal; no masses, no organomegaly  Musculoskeletal: ROM normal in all joints, no crepitus, no deformity, Normal muscle strengthen. Back  is  symmetric, no curvature. Skin: Skin color, texture, turgor normal. No rashes or lesions  Lymph nodes: Cervical, supraclavicular, and axillary nodes normal.  Neurologic: CN 2 -12 Normal, Normal symmetric reflexes. Normal coordination and gait  Psych: Alert & Oriented x 3, Mood appear stable.    Assessment:    Annual wellness medicare exam   Plan:    During the course of the visit the patient was educated and counseled about appropriate screening and preventive services including:   Annual flu vaccine     Patient Instructions (the written plan) was given to the patient.  Medicare Attestation  I have personally reviewed:  The patient's medical and social history  Their use of alcohol, tobacco or illicit drugs  Their current medications and supplements  The patient's functional ability including ADLs,fall risks, home safety risks, cognitive, and hearing and visual impairment  Diet and physical activities  Evidence for depression or mood disorders  The patient's weight, height, BMI, and visual acuity have been recorded in the chart. I have made referrals, counseling, and provided education to the patient based on review of the above and I have provided the patient with a written personalized care plan for preventive services.

## 2019-05-10 LAB — URINE CULTURE
MICRO NUMBER:: 1208363
SPECIMEN QUALITY:: ADEQUATE

## 2019-05-23 NOTE — Patient Instructions (Signed)
It was a pleasure to see you today.  Continue current medications and follow-up in 1 year or as needed. 

## 2019-06-05 ENCOUNTER — Telehealth: Payer: Self-pay | Admitting: Internal Medicine

## 2019-06-05 MED ORDER — SYNTHROID 50 MCG PO TABS: 50.0000 ug | ORAL_TABLET | Freq: Every day | ORAL | 1 refills | Status: DC

## 2019-06-05 NOTE — Telephone Encounter (Signed)
Received Fax RX request from  [facility] [place], [place] - [address] AT [address] Phone:  [phone]  Fax:  [phone]       Medication - SYNTHROID 50 MCG tablet   Last Refill - 05/07/19  Last OV - 05/09/19  Last CPE - 05/09/19  Next Appointment -

## 2019-06-10 DIAGNOSIS — M9901 Segmental and somatic dysfunction of cervical region: Secondary | ICD-10-CM | POA: Diagnosis not present

## 2019-06-10 DIAGNOSIS — M9907 Segmental and somatic dysfunction of upper extremity: Secondary | ICD-10-CM | POA: Diagnosis not present

## 2019-06-10 DIAGNOSIS — M9902 Segmental and somatic dysfunction of thoracic region: Secondary | ICD-10-CM | POA: Diagnosis not present

## 2019-06-14 DIAGNOSIS — J301 Allergic rhinitis due to pollen: Secondary | ICD-10-CM | POA: Diagnosis not present

## 2019-06-14 DIAGNOSIS — J3081 Allergic rhinitis due to animal (cat) (dog) hair and dander: Secondary | ICD-10-CM | POA: Diagnosis not present

## 2019-06-14 DIAGNOSIS — J3089 Other allergic rhinitis: Secondary | ICD-10-CM | POA: Diagnosis not present

## 2019-06-17 DIAGNOSIS — M9902 Segmental and somatic dysfunction of thoracic region: Secondary | ICD-10-CM | POA: Diagnosis not present

## 2019-06-17 DIAGNOSIS — M9901 Segmental and somatic dysfunction of cervical region: Secondary | ICD-10-CM | POA: Diagnosis not present

## 2019-06-17 DIAGNOSIS — M9907 Segmental and somatic dysfunction of upper extremity: Secondary | ICD-10-CM | POA: Diagnosis not present

## 2019-06-22 ENCOUNTER — Ambulatory Visit: Payer: 59

## 2019-06-26 DIAGNOSIS — J301 Allergic rhinitis due to pollen: Secondary | ICD-10-CM | POA: Diagnosis not present

## 2019-06-26 DIAGNOSIS — J3089 Other allergic rhinitis: Secondary | ICD-10-CM | POA: Diagnosis not present

## 2019-06-29 ENCOUNTER — Ambulatory Visit: Payer: Medicare Other | Attending: Internal Medicine

## 2019-06-29 DIAGNOSIS — Z23 Encounter for immunization: Secondary | ICD-10-CM | POA: Insufficient documentation

## 2019-06-29 NOTE — Progress Notes (Signed)
   Covid-19 Vaccination Clinic  Name:  Chelsea Cantrell    MRN: [id number] DOB: 20-Jun-1945  06/29/2019  Ms. Chelsea Cantrell was observed post Covid-19 immunization for 15 minutes without incidence. She was provided with Vaccine Information Sheet and instruction to access the V-Safe system.   Ms. Chelsea Cantrell was instructed to call 911 with any severe reactions post vaccine: Marland Kitchen Difficulty breathing  . Swelling of your face and throat  . A fast heartbeat  . A bad rash all over your body  . Dizziness and weakness    Immunizations Administered    Name Date Dose VIS Date Route   Pfizer COVID-19 Vaccine 06/29/2019  5:52 PM 0.3 mL 05/03/2019 Intramuscular   Manufacturer: [place]   Lot: [id number]   [place]: [id number]

## 2019-07-03 ENCOUNTER — Ambulatory Visit: Payer: 59

## 2019-07-10 DIAGNOSIS — J301 Allergic rhinitis due to pollen: Secondary | ICD-10-CM | POA: Diagnosis not present

## 2019-07-10 DIAGNOSIS — R140 Abdominal distension (gaseous): Secondary | ICD-10-CM | POA: Diagnosis not present

## 2019-07-10 DIAGNOSIS — K5900 Constipation, unspecified: Secondary | ICD-10-CM | POA: Diagnosis not present

## 2019-07-10 DIAGNOSIS — J3081 Allergic rhinitis due to animal (cat) (dog) hair and dander: Secondary | ICD-10-CM | POA: Diagnosis not present

## 2019-07-10 DIAGNOSIS — J3089 Other allergic rhinitis: Secondary | ICD-10-CM | POA: Diagnosis not present

## 2019-07-12 DIAGNOSIS — J3089 Other allergic rhinitis: Secondary | ICD-10-CM | POA: Diagnosis not present

## 2019-07-12 DIAGNOSIS — J3081 Allergic rhinitis due to animal (cat) (dog) hair and dander: Secondary | ICD-10-CM | POA: Diagnosis not present

## 2019-07-12 DIAGNOSIS — J301 Allergic rhinitis due to pollen: Secondary | ICD-10-CM | POA: Diagnosis not present

## 2019-07-12 DIAGNOSIS — J4520 Mild intermittent asthma, uncomplicated: Secondary | ICD-10-CM | POA: Diagnosis not present

## 2019-07-16 DIAGNOSIS — H401131 Primary open-angle glaucoma, bilateral, mild stage: Secondary | ICD-10-CM | POA: Diagnosis not present

## 2019-07-16 DIAGNOSIS — Z9889 Other specified postprocedural states: Secondary | ICD-10-CM | POA: Diagnosis not present

## 2019-07-16 DIAGNOSIS — H2513 Age-related nuclear cataract, bilateral: Secondary | ICD-10-CM | POA: Diagnosis not present

## 2019-07-16 DIAGNOSIS — H04123 Dry eye syndrome of bilateral lacrimal glands: Secondary | ICD-10-CM | POA: Diagnosis not present

## 2019-07-17 DIAGNOSIS — J301 Allergic rhinitis due to pollen: Secondary | ICD-10-CM | POA: Diagnosis not present

## 2019-07-17 DIAGNOSIS — J3081 Allergic rhinitis due to animal (cat) (dog) hair and dander: Secondary | ICD-10-CM | POA: Diagnosis not present

## 2019-07-17 DIAGNOSIS — J3089 Other allergic rhinitis: Secondary | ICD-10-CM | POA: Diagnosis not present

## 2019-07-19 DIAGNOSIS — J3089 Other allergic rhinitis: Secondary | ICD-10-CM | POA: Diagnosis not present

## 2019-07-19 DIAGNOSIS — J3081 Allergic rhinitis due to animal (cat) (dog) hair and dander: Secondary | ICD-10-CM | POA: Diagnosis not present

## 2019-07-19 DIAGNOSIS — J301 Allergic rhinitis due to pollen: Secondary | ICD-10-CM | POA: Diagnosis not present

## 2019-07-23 DIAGNOSIS — J301 Allergic rhinitis due to pollen: Secondary | ICD-10-CM | POA: Diagnosis not present

## 2019-07-23 DIAGNOSIS — J3089 Other allergic rhinitis: Secondary | ICD-10-CM | POA: Diagnosis not present

## 2019-07-23 DIAGNOSIS — J3081 Allergic rhinitis due to animal (cat) (dog) hair and dander: Secondary | ICD-10-CM | POA: Diagnosis not present

## 2019-07-24 ENCOUNTER — Ambulatory Visit: Payer: Medicare Other | Attending: Internal Medicine

## 2019-07-24 DIAGNOSIS — Z23 Encounter for immunization: Secondary | ICD-10-CM

## 2019-07-24 NOTE — Progress Notes (Signed)
   Covid-19 Vaccination Clinic  Name:  Chelsea Cantrell    MRN: [id number] DOB: January 15, 1946  07/24/2019  Chelsea Cantrell was observed post Covid-19 immunization for 15 minutes without incident. She was provided with Vaccine Information Sheet and instruction to access the V-Safe system.   Chelsea Cantrell was instructed to call 911 with any severe reactions post vaccine: Marland Kitchen Difficulty breathing  . Swelling of face and throat  . A fast heartbeat  . A bad rash all over body  . Dizziness and weakness   Immunizations Administered    Name Date Dose VIS Date Route   Pfizer COVID-19 Vaccine 07/24/2019  3:19 PM 0.3 mL 05/03/2019 Intramuscular   Manufacturer: [place]   Lot: [id number]   [place]: [id number]

## 2019-08-09 DIAGNOSIS — J301 Allergic rhinitis due to pollen: Secondary | ICD-10-CM | POA: Diagnosis not present

## 2019-08-09 DIAGNOSIS — J3089 Other allergic rhinitis: Secondary | ICD-10-CM | POA: Diagnosis not present

## 2019-08-09 DIAGNOSIS — J3081 Allergic rhinitis due to animal (cat) (dog) hair and dander: Secondary | ICD-10-CM | POA: Diagnosis not present

## 2019-08-21 DIAGNOSIS — J3089 Other allergic rhinitis: Secondary | ICD-10-CM | POA: Diagnosis not present

## 2019-08-21 DIAGNOSIS — J3081 Allergic rhinitis due to animal (cat) (dog) hair and dander: Secondary | ICD-10-CM | POA: Diagnosis not present

## 2019-08-21 DIAGNOSIS — J301 Allergic rhinitis due to pollen: Secondary | ICD-10-CM | POA: Diagnosis not present

## 2019-09-06 DIAGNOSIS — H1045 Other chronic allergic conjunctivitis: Secondary | ICD-10-CM | POA: Diagnosis not present

## 2019-09-06 DIAGNOSIS — J301 Allergic rhinitis due to pollen: Secondary | ICD-10-CM | POA: Diagnosis not present

## 2019-09-06 DIAGNOSIS — J3089 Other allergic rhinitis: Secondary | ICD-10-CM | POA: Diagnosis not present

## 2019-09-06 DIAGNOSIS — J3081 Allergic rhinitis due to animal (cat) (dog) hair and dander: Secondary | ICD-10-CM | POA: Diagnosis not present

## 2019-09-18 DIAGNOSIS — J301 Allergic rhinitis due to pollen: Secondary | ICD-10-CM | POA: Diagnosis not present

## 2019-09-18 DIAGNOSIS — J3081 Allergic rhinitis due to animal (cat) (dog) hair and dander: Secondary | ICD-10-CM | POA: Diagnosis not present

## 2019-09-18 DIAGNOSIS — J3089 Other allergic rhinitis: Secondary | ICD-10-CM | POA: Diagnosis not present

## 2019-09-23 DIAGNOSIS — J3089 Other allergic rhinitis: Secondary | ICD-10-CM | POA: Diagnosis not present

## 2019-09-23 DIAGNOSIS — J301 Allergic rhinitis due to pollen: Secondary | ICD-10-CM | POA: Diagnosis not present

## 2019-09-23 DIAGNOSIS — J3081 Allergic rhinitis due to animal (cat) (dog) hair and dander: Secondary | ICD-10-CM | POA: Diagnosis not present

## 2019-09-25 DIAGNOSIS — J3089 Other allergic rhinitis: Secondary | ICD-10-CM | POA: Diagnosis not present

## 2019-09-25 DIAGNOSIS — J3081 Allergic rhinitis due to animal (cat) (dog) hair and dander: Secondary | ICD-10-CM | POA: Diagnosis not present

## 2019-09-25 DIAGNOSIS — J301 Allergic rhinitis due to pollen: Secondary | ICD-10-CM | POA: Diagnosis not present

## 2019-09-27 DIAGNOSIS — J3089 Other allergic rhinitis: Secondary | ICD-10-CM | POA: Diagnosis not present

## 2019-09-27 DIAGNOSIS — J301 Allergic rhinitis due to pollen: Secondary | ICD-10-CM | POA: Diagnosis not present

## 2019-09-27 DIAGNOSIS — J3081 Allergic rhinitis due to animal (cat) (dog) hair and dander: Secondary | ICD-10-CM | POA: Diagnosis not present

## 2019-09-30 DIAGNOSIS — J301 Allergic rhinitis due to pollen: Secondary | ICD-10-CM | POA: Diagnosis not present

## 2019-09-30 DIAGNOSIS — J3089 Other allergic rhinitis: Secondary | ICD-10-CM | POA: Diagnosis not present

## 2019-09-30 DIAGNOSIS — J3081 Allergic rhinitis due to animal (cat) (dog) hair and dander: Secondary | ICD-10-CM | POA: Diagnosis not present

## 2019-10-02 DIAGNOSIS — J3089 Other allergic rhinitis: Secondary | ICD-10-CM | POA: Diagnosis not present

## 2019-10-02 DIAGNOSIS — J301 Allergic rhinitis due to pollen: Secondary | ICD-10-CM | POA: Diagnosis not present

## 2019-10-02 DIAGNOSIS — J3081 Allergic rhinitis due to animal (cat) (dog) hair and dander: Secondary | ICD-10-CM | POA: Diagnosis not present

## 2019-10-04 DIAGNOSIS — J3089 Other allergic rhinitis: Secondary | ICD-10-CM | POA: Diagnosis not present

## 2019-10-04 DIAGNOSIS — J301 Allergic rhinitis due to pollen: Secondary | ICD-10-CM | POA: Diagnosis not present

## 2019-10-04 DIAGNOSIS — J3081 Allergic rhinitis due to animal (cat) (dog) hair and dander: Secondary | ICD-10-CM | POA: Diagnosis not present

## 2019-10-09 DIAGNOSIS — J3089 Other allergic rhinitis: Secondary | ICD-10-CM | POA: Diagnosis not present

## 2019-10-09 DIAGNOSIS — J301 Allergic rhinitis due to pollen: Secondary | ICD-10-CM | POA: Diagnosis not present

## 2019-10-09 DIAGNOSIS — J3081 Allergic rhinitis due to animal (cat) (dog) hair and dander: Secondary | ICD-10-CM | POA: Diagnosis not present

## 2019-10-11 DIAGNOSIS — J301 Allergic rhinitis due to pollen: Secondary | ICD-10-CM | POA: Diagnosis not present

## 2019-10-11 DIAGNOSIS — J3081 Allergic rhinitis due to animal (cat) (dog) hair and dander: Secondary | ICD-10-CM | POA: Diagnosis not present

## 2019-10-11 DIAGNOSIS — J3089 Other allergic rhinitis: Secondary | ICD-10-CM | POA: Diagnosis not present

## 2019-10-14 DIAGNOSIS — J301 Allergic rhinitis due to pollen: Secondary | ICD-10-CM | POA: Diagnosis not present

## 2019-10-14 DIAGNOSIS — J3081 Allergic rhinitis due to animal (cat) (dog) hair and dander: Secondary | ICD-10-CM | POA: Diagnosis not present

## 2019-10-14 DIAGNOSIS — J3089 Other allergic rhinitis: Secondary | ICD-10-CM | POA: Diagnosis not present

## 2019-10-16 DIAGNOSIS — J3081 Allergic rhinitis due to animal (cat) (dog) hair and dander: Secondary | ICD-10-CM | POA: Diagnosis not present

## 2019-10-16 DIAGNOSIS — J301 Allergic rhinitis due to pollen: Secondary | ICD-10-CM | POA: Diagnosis not present

## 2019-10-16 DIAGNOSIS — J3089 Other allergic rhinitis: Secondary | ICD-10-CM | POA: Diagnosis not present

## 2019-10-18 DIAGNOSIS — J3089 Other allergic rhinitis: Secondary | ICD-10-CM | POA: Diagnosis not present

## 2019-10-18 DIAGNOSIS — J301 Allergic rhinitis due to pollen: Secondary | ICD-10-CM | POA: Diagnosis not present

## 2019-10-18 DIAGNOSIS — J3081 Allergic rhinitis due to animal (cat) (dog) hair and dander: Secondary | ICD-10-CM | POA: Diagnosis not present

## 2019-10-22 DIAGNOSIS — J301 Allergic rhinitis due to pollen: Secondary | ICD-10-CM | POA: Diagnosis not present

## 2019-10-22 DIAGNOSIS — J3081 Allergic rhinitis due to animal (cat) (dog) hair and dander: Secondary | ICD-10-CM | POA: Diagnosis not present

## 2019-10-22 DIAGNOSIS — J3089 Other allergic rhinitis: Secondary | ICD-10-CM | POA: Diagnosis not present

## 2019-10-25 DIAGNOSIS — J3089 Other allergic rhinitis: Secondary | ICD-10-CM | POA: Diagnosis not present

## 2019-10-25 DIAGNOSIS — J3081 Allergic rhinitis due to animal (cat) (dog) hair and dander: Secondary | ICD-10-CM | POA: Diagnosis not present

## 2019-10-25 DIAGNOSIS — J301 Allergic rhinitis due to pollen: Secondary | ICD-10-CM | POA: Diagnosis not present

## 2019-10-28 DIAGNOSIS — J3081 Allergic rhinitis due to animal (cat) (dog) hair and dander: Secondary | ICD-10-CM | POA: Diagnosis not present

## 2019-10-28 DIAGNOSIS — J3089 Other allergic rhinitis: Secondary | ICD-10-CM | POA: Diagnosis not present

## 2019-10-28 DIAGNOSIS — J301 Allergic rhinitis due to pollen: Secondary | ICD-10-CM | POA: Diagnosis not present

## 2019-11-01 DIAGNOSIS — J3089 Other allergic rhinitis: Secondary | ICD-10-CM | POA: Diagnosis not present

## 2019-11-01 DIAGNOSIS — J301 Allergic rhinitis due to pollen: Secondary | ICD-10-CM | POA: Diagnosis not present

## 2019-11-01 DIAGNOSIS — J3081 Allergic rhinitis due to animal (cat) (dog) hair and dander: Secondary | ICD-10-CM | POA: Diagnosis not present

## 2019-11-04 DIAGNOSIS — J3081 Allergic rhinitis due to animal (cat) (dog) hair and dander: Secondary | ICD-10-CM | POA: Diagnosis not present

## 2019-11-04 DIAGNOSIS — J3089 Other allergic rhinitis: Secondary | ICD-10-CM | POA: Diagnosis not present

## 2019-11-04 DIAGNOSIS — J301 Allergic rhinitis due to pollen: Secondary | ICD-10-CM | POA: Diagnosis not present

## 2019-11-29 ENCOUNTER — Telehealth: Payer: Self-pay | Admitting: Internal Medicine

## 2019-11-29 MED ORDER — SYNTHROID 50 MCG PO TABS: 50.0000 ug | ORAL_TABLET | Freq: Every day | ORAL | 1 refills | Status: DC

## 2019-11-29 NOTE — Telephone Encounter (Signed)
Received Fax RX request from  [facility] [place], [place] - [address] AT [address] Phone:  [phone]  Fax:  [phone]       Medication - SYNTHROID 50 MCG tablet   Last Refill - 08/31/2019  Last OV - 05/09/19  Last CPE - 05/09/19  Next Appointment -

## 2019-11-29 NOTE — Telephone Encounter (Signed)
Please call pt. Need to make CPE appt before refilling

## 2019-12-13 DIAGNOSIS — J3081 Allergic rhinitis due to animal (cat) (dog) hair and dander: Secondary | ICD-10-CM | POA: Diagnosis not present

## 2019-12-13 DIAGNOSIS — J3089 Other allergic rhinitis: Secondary | ICD-10-CM | POA: Diagnosis not present

## 2019-12-13 DIAGNOSIS — J301 Allergic rhinitis due to pollen: Secondary | ICD-10-CM | POA: Diagnosis not present

## 2019-12-25 DIAGNOSIS — J301 Allergic rhinitis due to pollen: Secondary | ICD-10-CM | POA: Diagnosis not present

## 2019-12-25 DIAGNOSIS — J3081 Allergic rhinitis due to animal (cat) (dog) hair and dander: Secondary | ICD-10-CM | POA: Diagnosis not present

## 2020-01-08 DIAGNOSIS — J301 Allergic rhinitis due to pollen: Secondary | ICD-10-CM | POA: Diagnosis not present

## 2020-01-08 DIAGNOSIS — J3089 Other allergic rhinitis: Secondary | ICD-10-CM | POA: Diagnosis not present

## 2020-01-08 DIAGNOSIS — J3081 Allergic rhinitis due to animal (cat) (dog) hair and dander: Secondary | ICD-10-CM | POA: Diagnosis not present

## 2020-01-13 DIAGNOSIS — H401131 Primary open-angle glaucoma, bilateral, mild stage: Secondary | ICD-10-CM | POA: Diagnosis not present

## 2020-01-22 DIAGNOSIS — J301 Allergic rhinitis due to pollen: Secondary | ICD-10-CM | POA: Diagnosis not present

## 2020-01-22 DIAGNOSIS — J3081 Allergic rhinitis due to animal (cat) (dog) hair and dander: Secondary | ICD-10-CM | POA: Diagnosis not present

## 2020-01-22 DIAGNOSIS — J3089 Other allergic rhinitis: Secondary | ICD-10-CM | POA: Diagnosis not present

## 2020-01-31 DIAGNOSIS — J301 Allergic rhinitis due to pollen: Secondary | ICD-10-CM | POA: Diagnosis not present

## 2020-01-31 DIAGNOSIS — J3081 Allergic rhinitis due to animal (cat) (dog) hair and dander: Secondary | ICD-10-CM | POA: Diagnosis not present

## 2020-01-31 DIAGNOSIS — J3089 Other allergic rhinitis: Secondary | ICD-10-CM | POA: Diagnosis not present

## 2020-02-05 DIAGNOSIS — J3081 Allergic rhinitis due to animal (cat) (dog) hair and dander: Secondary | ICD-10-CM | POA: Diagnosis not present

## 2020-02-05 DIAGNOSIS — J3089 Other allergic rhinitis: Secondary | ICD-10-CM | POA: Diagnosis not present

## 2020-02-05 DIAGNOSIS — J301 Allergic rhinitis due to pollen: Secondary | ICD-10-CM | POA: Diagnosis not present

## 2020-02-06 DIAGNOSIS — Z1231 Encounter for screening mammogram for malignant neoplasm of breast: Secondary | ICD-10-CM | POA: Diagnosis not present

## 2020-02-06 LAB — HM MAMMOGRAPHY

## 2020-02-11 ENCOUNTER — Encounter: Payer: Self-pay | Admitting: Internal Medicine

## 2020-02-18 DIAGNOSIS — Z23 Encounter for immunization: Secondary | ICD-10-CM | POA: Diagnosis not present

## 2020-02-19 DIAGNOSIS — J3081 Allergic rhinitis due to animal (cat) (dog) hair and dander: Secondary | ICD-10-CM | POA: Diagnosis not present

## 2020-02-19 DIAGNOSIS — J3089 Other allergic rhinitis: Secondary | ICD-10-CM | POA: Diagnosis not present

## 2020-02-19 DIAGNOSIS — J301 Allergic rhinitis due to pollen: Secondary | ICD-10-CM | POA: Diagnosis not present

## 2020-02-21 DIAGNOSIS — J3081 Allergic rhinitis due to animal (cat) (dog) hair and dander: Secondary | ICD-10-CM | POA: Diagnosis not present

## 2020-02-21 DIAGNOSIS — J3089 Other allergic rhinitis: Secondary | ICD-10-CM | POA: Diagnosis not present

## 2020-02-21 DIAGNOSIS — J301 Allergic rhinitis due to pollen: Secondary | ICD-10-CM | POA: Diagnosis not present

## 2020-02-25 DIAGNOSIS — J3089 Other allergic rhinitis: Secondary | ICD-10-CM | POA: Diagnosis not present

## 2020-02-25 DIAGNOSIS — J301 Allergic rhinitis due to pollen: Secondary | ICD-10-CM | POA: Diagnosis not present

## 2020-02-25 DIAGNOSIS — J3081 Allergic rhinitis due to animal (cat) (dog) hair and dander: Secondary | ICD-10-CM | POA: Diagnosis not present

## 2020-02-27 DIAGNOSIS — J3081 Allergic rhinitis due to animal (cat) (dog) hair and dander: Secondary | ICD-10-CM | POA: Diagnosis not present

## 2020-02-27 DIAGNOSIS — J3089 Other allergic rhinitis: Secondary | ICD-10-CM | POA: Diagnosis not present

## 2020-02-27 DIAGNOSIS — J301 Allergic rhinitis due to pollen: Secondary | ICD-10-CM | POA: Diagnosis not present

## 2020-03-06 DIAGNOSIS — J3081 Allergic rhinitis due to animal (cat) (dog) hair and dander: Secondary | ICD-10-CM | POA: Diagnosis not present

## 2020-03-06 DIAGNOSIS — J301 Allergic rhinitis due to pollen: Secondary | ICD-10-CM | POA: Diagnosis not present

## 2020-03-06 DIAGNOSIS — J3089 Other allergic rhinitis: Secondary | ICD-10-CM | POA: Diagnosis not present

## 2020-03-07 ENCOUNTER — Ambulatory Visit: Payer: Medicare Other | Attending: Internal Medicine

## 2020-03-07 NOTE — Progress Notes (Signed)
   Covid-19 Vaccination Clinic  Name:  Chelsea Cantrell    MRN: [id number] DOB: 06-07-1945  03/07/2020  Chelsea Cantrell was observed post Covid-19 immunization for 15 minutes without incident. She was provided with Vaccine Information Sheet and instruction to access the V-Safe system.   Chelsea Cantrell was instructed to call 911 with any severe reactions post vaccine: Marland Kitchen Difficulty breathing  . Swelling of face and throat  . A fast heartbeat  . A bad rash all over body  . Dizziness and weakness

## 2020-03-10 DIAGNOSIS — H1045 Other chronic allergic conjunctivitis: Secondary | ICD-10-CM | POA: Diagnosis not present

## 2020-03-10 DIAGNOSIS — J301 Allergic rhinitis due to pollen: Secondary | ICD-10-CM | POA: Diagnosis not present

## 2020-03-10 DIAGNOSIS — J3081 Allergic rhinitis due to animal (cat) (dog) hair and dander: Secondary | ICD-10-CM | POA: Diagnosis not present

## 2020-03-10 DIAGNOSIS — J3089 Other allergic rhinitis: Secondary | ICD-10-CM | POA: Diagnosis not present

## 2020-03-10 DIAGNOSIS — J4530 Mild persistent asthma, uncomplicated: Secondary | ICD-10-CM | POA: Diagnosis not present

## 2020-03-27 DIAGNOSIS — J3089 Other allergic rhinitis: Secondary | ICD-10-CM | POA: Diagnosis not present

## 2020-03-27 DIAGNOSIS — J3081 Allergic rhinitis due to animal (cat) (dog) hair and dander: Secondary | ICD-10-CM | POA: Diagnosis not present

## 2020-03-27 DIAGNOSIS — J301 Allergic rhinitis due to pollen: Secondary | ICD-10-CM | POA: Diagnosis not present

## 2020-04-13 DIAGNOSIS — J3081 Allergic rhinitis due to animal (cat) (dog) hair and dander: Secondary | ICD-10-CM | POA: Diagnosis not present

## 2020-04-13 DIAGNOSIS — J3089 Other allergic rhinitis: Secondary | ICD-10-CM | POA: Diagnosis not present

## 2020-04-13 DIAGNOSIS — J301 Allergic rhinitis due to pollen: Secondary | ICD-10-CM | POA: Diagnosis not present

## 2020-04-29 DIAGNOSIS — J3081 Allergic rhinitis due to animal (cat) (dog) hair and dander: Secondary | ICD-10-CM | POA: Diagnosis not present

## 2020-04-29 DIAGNOSIS — J3089 Other allergic rhinitis: Secondary | ICD-10-CM | POA: Diagnosis not present

## 2020-04-29 DIAGNOSIS — J301 Allergic rhinitis due to pollen: Secondary | ICD-10-CM | POA: Diagnosis not present

## 2020-05-07 ENCOUNTER — Other Ambulatory Visit: Payer: Medicare Other | Admitting: Internal Medicine

## 2020-05-07 ENCOUNTER — Other Ambulatory Visit: Payer: Self-pay

## 2020-05-07 DIAGNOSIS — J301 Allergic rhinitis due to pollen: Secondary | ICD-10-CM | POA: Insufficient documentation

## 2020-05-07 DIAGNOSIS — E7800 Pure hypercholesterolemia, unspecified: Secondary | ICD-10-CM | POA: Diagnosis not present

## 2020-05-07 DIAGNOSIS — E039 Hypothyroidism, unspecified: Secondary | ICD-10-CM | POA: Diagnosis not present

## 2020-05-07 DIAGNOSIS — F338 Other recurrent depressive disorders: Secondary | ICD-10-CM | POA: Diagnosis not present

## 2020-05-07 DIAGNOSIS — H1045 Other chronic allergic conjunctivitis: Secondary | ICD-10-CM | POA: Insufficient documentation

## 2020-05-07 DIAGNOSIS — J4521 Mild intermittent asthma with (acute) exacerbation: Secondary | ICD-10-CM | POA: Insufficient documentation

## 2020-05-07 DIAGNOSIS — Z789 Other specified health status: Secondary | ICD-10-CM

## 2020-05-07 DIAGNOSIS — J4520 Mild intermittent asthma, uncomplicated: Secondary | ICD-10-CM | POA: Insufficient documentation

## 2020-05-07 DIAGNOSIS — Z0000 Encounter for general adult medical examination without abnormal findings: Secondary | ICD-10-CM

## 2020-05-07 DIAGNOSIS — J3081 Allergic rhinitis due to animal (cat) (dog) hair and dander: Secondary | ICD-10-CM | POA: Insufficient documentation

## 2020-05-07 DIAGNOSIS — J4530 Mild persistent asthma, uncomplicated: Secondary | ICD-10-CM | POA: Insufficient documentation

## 2020-05-08 LAB — COMPLETE METABOLIC PANEL WITH GFR
AG Ratio: 2.6 (calc) — ABNORMAL HIGH (ref 1.0–2.5)
ALT: 22 U/L (ref 6–29)
AST: 36 U/L — ABNORMAL HIGH (ref 10–35)
Albumin: 4.5 g/dL (ref 3.6–5.1)
Alkaline phosphatase (APISO): 51 U/L (ref 37–153)
BUN: 20 mg/dL (ref 7–25)
CO2: 28 mmol/L (ref 20–32)
Calcium: 9.8 mg/dL (ref 8.6–10.4)
Chloride: 103 mmol/L (ref 98–110)
Creat: 0.91 mg/dL (ref 0.60–0.93)
GFR, Est African American: 73 mL/min/{1.73_m2} (ref >=60)
GFR, Est Non African American: 63 mL/min/{1.73_m2} (ref >=60)
Globulin: 1.7 g/dL (calc) — ABNORMAL LOW (ref 1.9–3.7)
Glucose, Bld: 86 mg/dL (ref 65–99)
Potassium: 5.2 mmol/L (ref 3.5–5.3)
Sodium: 139 mmol/L (ref 135–146)
Total Bilirubin: 0.8 mg/dL (ref 0.2–1.2)
Total Protein: 6.2 g/dL (ref 6.1–8.1)

## 2020-05-08 LAB — LIPID PANEL
Cholesterol: 195 mg/dL (ref <200)
HDL: 78 mg/dL (ref >=50)
LDL Cholesterol (Calc): 101 mg/dL (calc) — ABNORMAL HIGH
Non-HDL Cholesterol (Calc): 117 mg/dL (calc) (ref <130)
Total CHOL/HDL Ratio: 2.5 (calc) (ref <5.0)
Triglycerides: 74 mg/dL (ref <150)

## 2020-05-08 LAB — CBC WITH DIFFERENTIAL/PLATELET
Absolute Monocytes: 336 cells/uL (ref 200–950)
Basophils Absolute: 51 cells/uL (ref 0–200)
Basophils Relative: 1.1 %
Eosinophils Absolute: 147 cells/uL (ref 15–500)
Eosinophils Relative: 3.2 %
HCT: 40.4 % (ref 35.0–45.0)
Hemoglobin: 13.8 g/dL (ref 11.7–15.5)
Lymphs Abs: 644 cells/uL — ABNORMAL LOW (ref 850–3900)
MCH: 31 pg (ref 27.0–33.0)
MCHC: 34.2 g/dL (ref 32.0–36.0)
MCV: 90.8 fL (ref 80.0–100.0)
MPV: 9.5 fL (ref 7.5–12.5)
Monocytes Relative: 7.3 %
Neutro Abs: 3422 cells/uL (ref 1500–7800)
Neutrophils Relative %: 74.4 %
Platelets: 296 10*3/uL (ref 140–400)
RBC: 4.45 10*6/uL (ref 3.80–5.10)
RDW: 11.7 % (ref 11.0–15.0)
Total Lymphocyte: 14 %
WBC: 4.6 10*3/uL (ref 3.8–10.8)

## 2020-05-08 LAB — TSH: TSH: 1.26 mIU/L (ref 0.40–4.50)

## 2020-05-12 ENCOUNTER — Other Ambulatory Visit: Payer: Self-pay

## 2020-05-12 ENCOUNTER — Encounter: Payer: Self-pay | Admitting: Internal Medicine

## 2020-05-12 ENCOUNTER — Ambulatory Visit (INDEPENDENT_AMBULATORY_CARE_PROVIDER_SITE_OTHER): Payer: Medicare Other | Admitting: Internal Medicine

## 2020-05-12 VITALS — BP 100/70 | HR 60 | Ht 66.25 in | Wt 126.0 lb

## 2020-05-12 DIAGNOSIS — K5904 Chronic idiopathic constipation: Secondary | ICD-10-CM | POA: Diagnosis not present

## 2020-05-12 DIAGNOSIS — Z0000 Encounter for general adult medical examination without abnormal findings: Secondary | ICD-10-CM

## 2020-05-12 DIAGNOSIS — E039 Hypothyroidism, unspecified: Secondary | ICD-10-CM | POA: Diagnosis not present

## 2020-05-12 DIAGNOSIS — Z789 Other specified health status: Secondary | ICD-10-CM

## 2020-05-12 DIAGNOSIS — J3081 Allergic rhinitis due to animal (cat) (dog) hair and dander: Secondary | ICD-10-CM

## 2020-05-12 DIAGNOSIS — J301 Allergic rhinitis due to pollen: Secondary | ICD-10-CM | POA: Diagnosis not present

## 2020-05-12 DIAGNOSIS — Z8719 Personal history of other diseases of the digestive system: Secondary | ICD-10-CM

## 2020-05-12 DIAGNOSIS — J45990 Exercise induced bronchospasm: Secondary | ICD-10-CM

## 2020-05-12 LAB — POCT URINALYSIS DIPSTICK
Appearance: NEGATIVE
Bilirubin, UA: NEGATIVE
Blood, UA: NEGATIVE
Glucose, UA: NEGATIVE
Ketones, UA: NEGATIVE
Leukocytes, UA: NEGATIVE
Nitrite, UA: NEGATIVE
Odor: NEGATIVE
Protein, UA: NEGATIVE
Spec Grav, UA: 1.01 (ref 1.010–1.025)
Urobilinogen, UA: 0.2 E.U./dL
pH, UA: 6.5 (ref 5.0–8.0)

## 2020-05-12 NOTE — Patient Instructions (Signed)
Please have Shingrix vaccines from [facility] when you have time.  Check with Dr. Earlean Shawl regarding when it is time for repeat colonoscopy when you see him after the first of the year.  It was a pleasure to see you today.  Flu vaccine is up-to-date.  Return in 1 year or as needed.  Labs reviewed.  Mammogram is up-to-date.

## 2020-05-12 NOTE — Progress Notes (Signed)
Subjective:    Patient ID: Chelsea Cantrell, female    DOB: 12/27/1945, 74 y.o.   MRN: [id number]  HPI 74 year old Female for health maintenance exam, Medicare wellness, and evaluation of medical issues. Still working out at gym and walking her puppy.  Stays in good shape physically.  Has had mammogram. Sees Dr. Earlean Shawl after the first of the year. Takes Linzess for constipation.  No Shingrix vaccine on file at [facility].  Has had high-dose flu vaccine.  Has had 3 COVID vaccines.  Is followed at [facility].  She is allergic to dogs, cats, and pollen.  She takes allergy injections.  History of hypothyroidism.  History of recurrent pancreatitis and not had episode in several years.  Had recurrent episodes in 2016.  A stent was placed in her bile/pancreatic duct at [facility] in June 2016 and she had a sphincterotomy.  The stent was subsequently removed by Dr. Earlean Shawl and she has had no further issues.  Intolerant of generic Wellbutrin  History of GE reflux, exercise-induced asthma, Schatzki's ring, hyperplastic colon polyps.  She takes estrogen replacement.  She was tried on Strattera in 2003 for attention deficit disorder but currently does not take medicine for attention deficit.  History of spinal stenosis, seasonal affective disorder.  She previously had steroid injections in 2011 for multilevel degenerative disc disease at L3-L4 and L4-L5 by Dr. Jimmye Norman.  Had another epidural steroid injection in January 2019 but did not feel that it helped.  Pain returned after 3 weeks and she did not repeat the injection.  Social history: Single, never married.  No children.  Retired as an Programme researcher, broadcasting/film/video for [employer].  Did smoke prior to 1976 but currently non-smoker.  She is a vegetarian.  She drinks wine.  Exercises routinely.  Family history: Father died at age 61 of lung cancer with history of MI at age 50.  Father also has hyperlipidemia.  Mother died at age 51 but had a stroke 4  years before she passed away.  1 brother and 1 sister in good health.    Review of Systems history of constipation treated with Linzess.  History of spinal stenosis with recurrent back pain which has been treated with epidural steroids.     Objective:   Physical Exam Blood pressure 100/70 pulse 60 pulse oximetry 97% weight 126 pounds BMI 20.18  Skin is warm and dry.  Nodes: None.  TMs are clear.  Neck is supple without JVD, thyromegaly or carotid bruits.  Chest is clear to auscultation.  Cardiac exam: Regular rate and rhythm normal S1 and S2 without murmurs or gallops.  Abdomen: Soft nondistended without hepatosplenomegaly masses or tenderness.  No lower extremity pitting edema.  Neuro intact without focal deficits.  Bimanual deferred to GYN physician.  Affect thought and judgment are normal.       Assessment & Plan:  Hypothyroidism-TSH is excellent at 1.26.  Continue current dose of thyroid replacement and follow-up in 6 to 12 months  Hyperlipidemia-has had elevated LDL about 3 years ago of 118.  LDL is now 101.  Constipation-treated with Linzess  Seasonal affective disorder-stable and currently not on medication  History of allergic rhinitis and exercise-induced asthma treated by allergist with allergy injections.  Remote history of pancreatitis requiring stent placement but no recurrent episodes in several years.  She had sphincterotomy.  Plan: Return in 1 year or as needed.  Continue current medications.  Have Shingrix vaccines in the near future.  Check with  Dr. Earlean Shawl to see when it is time for your repeat colonoscopy when you see him after the first of the year.  Flu vaccine is up-to-date.  Mammogram is up-to-date.  Subjective:   Patient presents for Medicare Annual/Subsequent preventive examination.  Review Past Medical/Family/Social: See above   Risk Factors  Current exercise habits: Exercises regularly Dietary issues discussed: Vegetarian diet  Cardiac risk  factors: Father with history of MI  Depression Screen  (Note: if answer to either of the following is "Yes", a more complete depression screening is indicated)   Over the past two weeks, have you felt down, depressed or hopeless? No  Over the past two weeks, have you felt little interest or pleasure in doing things? No Have you lost interest or pleasure in daily life? No Do you often feel hopeless? No Do you cry easily over simple problems? No   Activities of Daily Living  In your present state of health, do you have any difficulty performing the following activities?:   Driving? No  Managing money? No  Feeding yourself? No  Getting from bed to chair? No  Climbing a flight of stairs? No  Preparing food and eating?: No  Bathing or showering? No  Getting dressed: No  Getting to the toilet? No  Using the toilet:No  Moving around from place to place: No  In the past year have you fallen or had a near fall?:No  Are you sexually active? No  Do you have more than one partner? No   Hearing Difficulties: No  Do you often ask people to speak up or repeat themselves? No  Do you experience ringing or noises in your ears?  Yes sometimes Do you have difficulty understanding soft or whispered voices? No  Do you feel that you have a problem with memory?  Sometimes Do you often misplace items?  Yes   Home Safety:  Do you have a smoke alarm at your residence? Yes Do you have grab bars in the bathroom?  Yes Do you have throw rugs in your house?  None   Cognitive Testing  Alert? Yes Normal Appearance?Yes  Oriented to person? Yes Place? Yes  Time? Yes  Recall of three objects? Yes  Can perform simple calculations? Yes  Displays appropriate judgment?Yes  Can read the correct time from a watch face?Yes   List the Names of Other Physician/Practitioners you currently use:  See referral list for the physicians patient is currently seeing.     Review of Systems: See  above   Objective:     General appearance: Appears younger than stated age and trim Head: Normocephalic, without obvious abnormality, atraumatic  Eyes: conj clear, EOMi PEERLA  Ears: normal TM's and external ear canals both ears  Nose: Nares normal. Septum midline. Mucosa normal. No drainage or sinus tenderness.  Throat: lips, mucosa, and tongue normal; teeth and gums normal  Neck: no adenopathy, no carotid bruit, no JVD, supple, symmetrical, trachea midline and thyroid not enlarged, symmetric, no tenderness/mass/nodules  No CVA tenderness.  Lungs: clear to auscultation bilaterally  Breasts: normal appearance, no masses or tenderness Heart: regular rate and rhythm, S1, S2 normal, no murmur, click, rub or gallop  Abdomen: soft, non-tender; bowel sounds normal; no masses, no organomegaly  Musculoskeletal: ROM normal in all joints, no crepitus, no deformity, Normal muscle strengthen. Back  is symmetric, no curvature. Skin: Skin color, texture, turgor normal. No rashes or lesions  Lymph nodes: Cervical, supraclavicular, and axillary nodes normal.  Neurologic: CN 2 -  12 Normal, Normal symmetric reflexes. Normal coordination and gait  Psych: Alert & Oriented x 3, Mood appear stable.    Assessment:    Annual wellness medicare exam   Plan:    During the course of the visit the patient was educated and counseled about appropriate screening and preventive services including:   Immunizations are up-to-date     Patient Instructions (the written plan) was given to the patient.  Medicare Attestation  I have personally reviewed:  The patient's medical and social history  Their use of alcohol, tobacco or illicit drugs  Their current medications and supplements  The patient's functional ability including ADLs,fall risks, home safety risks, cognitive, and hearing and visual impairment  Diet and physical activities  Evidence for depression or mood disorders  The patient's weight, height,  BMI, and visual acuity have been recorded in the chart. I have made referrals, counseling, and provided education to the patient based on review of the above and I have provided the patient with a written personalized care plan for preventive services.

## 2020-05-13 DIAGNOSIS — J3089 Other allergic rhinitis: Secondary | ICD-10-CM | POA: Diagnosis not present

## 2020-05-13 DIAGNOSIS — J301 Allergic rhinitis due to pollen: Secondary | ICD-10-CM | POA: Diagnosis not present

## 2020-05-13 DIAGNOSIS — J3081 Allergic rhinitis due to animal (cat) (dog) hair and dander: Secondary | ICD-10-CM | POA: Diagnosis not present

## 2020-05-27 ENCOUNTER — Other Ambulatory Visit: Payer: Self-pay | Admitting: Internal Medicine

## 2020-05-28 DIAGNOSIS — J301 Allergic rhinitis due to pollen: Secondary | ICD-10-CM | POA: Diagnosis not present

## 2020-05-28 DIAGNOSIS — J3089 Other allergic rhinitis: Secondary | ICD-10-CM | POA: Diagnosis not present

## 2020-05-28 DIAGNOSIS — J3081 Allergic rhinitis due to animal (cat) (dog) hair and dander: Secondary | ICD-10-CM | POA: Diagnosis not present

## 2020-06-11 DIAGNOSIS — J301 Allergic rhinitis due to pollen: Secondary | ICD-10-CM | POA: Diagnosis not present

## 2020-06-11 DIAGNOSIS — J3089 Other allergic rhinitis: Secondary | ICD-10-CM | POA: Diagnosis not present

## 2020-06-11 DIAGNOSIS — J3081 Allergic rhinitis due to animal (cat) (dog) hair and dander: Secondary | ICD-10-CM | POA: Diagnosis not present

## 2020-06-16 DIAGNOSIS — J3081 Allergic rhinitis due to animal (cat) (dog) hair and dander: Secondary | ICD-10-CM | POA: Diagnosis not present

## 2020-06-16 DIAGNOSIS — J301 Allergic rhinitis due to pollen: Secondary | ICD-10-CM | POA: Diagnosis not present

## 2020-06-16 DIAGNOSIS — H1045 Other chronic allergic conjunctivitis: Secondary | ICD-10-CM | POA: Diagnosis not present

## 2020-06-16 DIAGNOSIS — J3089 Other allergic rhinitis: Secondary | ICD-10-CM | POA: Diagnosis not present

## 2020-06-24 DIAGNOSIS — J3081 Allergic rhinitis due to animal (cat) (dog) hair and dander: Secondary | ICD-10-CM | POA: Diagnosis not present

## 2020-06-24 DIAGNOSIS — J3089 Other allergic rhinitis: Secondary | ICD-10-CM | POA: Diagnosis not present

## 2020-06-24 DIAGNOSIS — J301 Allergic rhinitis due to pollen: Secondary | ICD-10-CM | POA: Diagnosis not present

## 2020-07-08 DIAGNOSIS — J3081 Allergic rhinitis due to animal (cat) (dog) hair and dander: Secondary | ICD-10-CM | POA: Diagnosis not present

## 2020-07-08 DIAGNOSIS — J3089 Other allergic rhinitis: Secondary | ICD-10-CM | POA: Diagnosis not present

## 2020-07-08 DIAGNOSIS — J301 Allergic rhinitis due to pollen: Secondary | ICD-10-CM | POA: Diagnosis not present

## 2020-07-09 DIAGNOSIS — K5904 Chronic idiopathic constipation: Secondary | ICD-10-CM | POA: Diagnosis not present

## 2020-07-09 DIAGNOSIS — R140 Abdominal distension (gaseous): Secondary | ICD-10-CM | POA: Diagnosis not present

## 2020-07-09 DIAGNOSIS — Z1211 Encounter for screening for malignant neoplasm of colon: Secondary | ICD-10-CM | POA: Diagnosis not present

## 2020-07-10 DIAGNOSIS — R140 Abdominal distension (gaseous): Secondary | ICD-10-CM | POA: Diagnosis not present

## 2020-07-14 DIAGNOSIS — H2513 Age-related nuclear cataract, bilateral: Secondary | ICD-10-CM | POA: Diagnosis not present

## 2020-07-14 DIAGNOSIS — H1045 Other chronic allergic conjunctivitis: Secondary | ICD-10-CM | POA: Diagnosis not present

## 2020-07-14 DIAGNOSIS — Z9889 Other specified postprocedural states: Secondary | ICD-10-CM | POA: Diagnosis not present

## 2020-07-14 DIAGNOSIS — H401131 Primary open-angle glaucoma, bilateral, mild stage: Secondary | ICD-10-CM | POA: Diagnosis not present

## 2020-07-14 DIAGNOSIS — H04123 Dry eye syndrome of bilateral lacrimal glands: Secondary | ICD-10-CM | POA: Diagnosis not present

## 2020-07-22 DIAGNOSIS — J3081 Allergic rhinitis due to animal (cat) (dog) hair and dander: Secondary | ICD-10-CM | POA: Diagnosis not present

## 2020-07-22 DIAGNOSIS — J3089 Other allergic rhinitis: Secondary | ICD-10-CM | POA: Diagnosis not present

## 2020-07-22 DIAGNOSIS — J301 Allergic rhinitis due to pollen: Secondary | ICD-10-CM | POA: Diagnosis not present

## 2020-08-05 DIAGNOSIS — J3089 Other allergic rhinitis: Secondary | ICD-10-CM | POA: Diagnosis not present

## 2020-08-05 DIAGNOSIS — J3081 Allergic rhinitis due to animal (cat) (dog) hair and dander: Secondary | ICD-10-CM | POA: Diagnosis not present

## 2020-08-05 DIAGNOSIS — J301 Allergic rhinitis due to pollen: Secondary | ICD-10-CM | POA: Diagnosis not present

## 2020-08-19 DIAGNOSIS — J3081 Allergic rhinitis due to animal (cat) (dog) hair and dander: Secondary | ICD-10-CM | POA: Diagnosis not present

## 2020-08-19 DIAGNOSIS — J301 Allergic rhinitis due to pollen: Secondary | ICD-10-CM | POA: Diagnosis not present

## 2020-08-19 DIAGNOSIS — J3089 Other allergic rhinitis: Secondary | ICD-10-CM | POA: Diagnosis not present

## 2020-09-02 DIAGNOSIS — J3089 Other allergic rhinitis: Secondary | ICD-10-CM | POA: Diagnosis not present

## 2020-09-02 DIAGNOSIS — J301 Allergic rhinitis due to pollen: Secondary | ICD-10-CM | POA: Diagnosis not present

## 2020-09-02 DIAGNOSIS — J3081 Allergic rhinitis due to animal (cat) (dog) hair and dander: Secondary | ICD-10-CM | POA: Diagnosis not present

## 2020-09-03 DIAGNOSIS — K5900 Constipation, unspecified: Secondary | ICD-10-CM | POA: Diagnosis not present

## 2020-09-03 DIAGNOSIS — R140 Abdominal distension (gaseous): Secondary | ICD-10-CM | POA: Diagnosis not present

## 2020-09-15 ENCOUNTER — Other Ambulatory Visit: Payer: Self-pay | Admitting: Podiatry

## 2020-09-15 ENCOUNTER — Ambulatory Visit (INDEPENDENT_AMBULATORY_CARE_PROVIDER_SITE_OTHER): Payer: Medicare Other

## 2020-09-15 ENCOUNTER — Ambulatory Visit (INDEPENDENT_AMBULATORY_CARE_PROVIDER_SITE_OTHER): Payer: Medicare Other | Admitting: Podiatry

## 2020-09-15 ENCOUNTER — Other Ambulatory Visit: Payer: Self-pay

## 2020-09-15 ENCOUNTER — Encounter: Payer: Self-pay | Admitting: Podiatry

## 2020-09-15 DIAGNOSIS — M7741 Metatarsalgia, right foot: Secondary | ICD-10-CM

## 2020-09-15 DIAGNOSIS — M2011 Hallux valgus (acquired), right foot: Secondary | ICD-10-CM

## 2020-09-15 DIAGNOSIS — M21622 Bunionette of left foot: Secondary | ICD-10-CM

## 2020-09-15 DIAGNOSIS — M21612 Bunion of left foot: Secondary | ICD-10-CM

## 2020-09-15 DIAGNOSIS — M2012 Hallux valgus (acquired), left foot: Secondary | ICD-10-CM

## 2020-09-15 DIAGNOSIS — M21621 Bunionette of right foot: Secondary | ICD-10-CM | POA: Diagnosis not present

## 2020-09-15 DIAGNOSIS — M21611 Bunion of right foot: Secondary | ICD-10-CM

## 2020-09-15 DIAGNOSIS — M7742 Metatarsalgia, left foot: Secondary | ICD-10-CM

## 2020-09-15 DIAGNOSIS — M7750 Other enthesopathy of unspecified foot: Secondary | ICD-10-CM

## 2020-09-15 NOTE — Progress Notes (Signed)
  Subjective:  Patient ID: Chelsea Cantrell, female    DOB: [birth year],  MRN: [id number]  Chief Complaint  Patient presents with  . Foot Pain    (np) left foot pain    75 y.o. female presents with the above complaint. History confirmed with patient.  Has been present for many years.  She had history of left foot surgery approximately 30 years ago with a doctor who is no longer here in [place].  Most the pain is under the second and third toes on the left foot  Objective:  Physical Exam: warm, good capillary refill, no trophic changes or ulcerative lesions, normal DP and PT pulses and normal sensory exam.  Bilaterally she has hallux valgus deformity with hammertoes and tailor's bunion.  Diffuse submetatarsal 2 and 3 hyperkeratosis bilaterally with left worse than right.  Painful in this area.  Thin fat pad distally  Radiographs: X-ray of the left foot: Hallux valgus and tailor's bunion deformity is present.  She has had previous Weil osteotomies which appear well aligned and well-healed Assessment:   1. Metatarsalgia of both feet   2. Hallux valgus with bunions, left   3. Hallux valgus with bunions, right   4. Tailor's bunion of both feet      Plan:  Patient was evaluated and treated and all questions answered.  Reviewed the radiographs with the patient and discussed the clinical exam findings.  At this point I do not think any further surgical intervention would be predictable enough to alleviate the pain she has.  The bunion and tailor's bunion are not themselves particularly painful although this may be contributing to the pressure in the submetatarsal 2 and 3 region.  I recommend we offload this with a custom molded accommodative insert with unloads on 2 and 3 bilaterally.  Follow-up after wearing the orthotics for some time to see if any adjustments are necessary  No follow-ups on file.

## 2020-09-16 DIAGNOSIS — J3089 Other allergic rhinitis: Secondary | ICD-10-CM | POA: Diagnosis not present

## 2020-09-16 DIAGNOSIS — J301 Allergic rhinitis due to pollen: Secondary | ICD-10-CM | POA: Diagnosis not present

## 2020-09-16 DIAGNOSIS — J3081 Allergic rhinitis due to animal (cat) (dog) hair and dander: Secondary | ICD-10-CM | POA: Diagnosis not present

## 2020-09-17 DIAGNOSIS — J301 Allergic rhinitis due to pollen: Secondary | ICD-10-CM | POA: Diagnosis not present

## 2020-09-17 DIAGNOSIS — J3081 Allergic rhinitis due to animal (cat) (dog) hair and dander: Secondary | ICD-10-CM | POA: Diagnosis not present

## 2020-09-17 DIAGNOSIS — J3089 Other allergic rhinitis: Secondary | ICD-10-CM | POA: Diagnosis not present

## 2020-09-30 DIAGNOSIS — J301 Allergic rhinitis due to pollen: Secondary | ICD-10-CM | POA: Diagnosis not present

## 2020-09-30 DIAGNOSIS — J3081 Allergic rhinitis due to animal (cat) (dog) hair and dander: Secondary | ICD-10-CM | POA: Diagnosis not present

## 2020-09-30 DIAGNOSIS — J3089 Other allergic rhinitis: Secondary | ICD-10-CM | POA: Diagnosis not present

## 2020-10-06 ENCOUNTER — Other Ambulatory Visit: Payer: Self-pay

## 2020-10-06 ENCOUNTER — Ambulatory Visit (INDEPENDENT_AMBULATORY_CARE_PROVIDER_SITE_OTHER): Payer: Medicare Other | Admitting: *Deleted

## 2020-10-06 DIAGNOSIS — M7741 Metatarsalgia, right foot: Secondary | ICD-10-CM

## 2020-10-06 DIAGNOSIS — M7742 Metatarsalgia, left foot: Secondary | ICD-10-CM

## 2020-10-06 NOTE — Progress Notes (Signed)
Patient presents today to pick up custom molded foot orthotics, diagnosed with metatarsalgia by Dr. Sherryle Lis.   Orthotics were dispensed and fit was satisfactory. Reviewed instructions for break-in and wear. Written instructions given to patient.  Patient will follow up as needed.   [facility] - order # [id number]

## 2020-10-14 DIAGNOSIS — J3081 Allergic rhinitis due to animal (cat) (dog) hair and dander: Secondary | ICD-10-CM | POA: Diagnosis not present

## 2020-10-14 DIAGNOSIS — J301 Allergic rhinitis due to pollen: Secondary | ICD-10-CM | POA: Diagnosis not present

## 2020-10-14 DIAGNOSIS — J3089 Other allergic rhinitis: Secondary | ICD-10-CM | POA: Diagnosis not present

## 2020-10-28 DIAGNOSIS — J3081 Allergic rhinitis due to animal (cat) (dog) hair and dander: Secondary | ICD-10-CM | POA: Diagnosis not present

## 2020-10-28 DIAGNOSIS — J301 Allergic rhinitis due to pollen: Secondary | ICD-10-CM | POA: Diagnosis not present

## 2020-10-28 DIAGNOSIS — J3089 Other allergic rhinitis: Secondary | ICD-10-CM | POA: Diagnosis not present

## 2020-11-11 DIAGNOSIS — J3089 Other allergic rhinitis: Secondary | ICD-10-CM | POA: Diagnosis not present

## 2020-11-11 DIAGNOSIS — J301 Allergic rhinitis due to pollen: Secondary | ICD-10-CM | POA: Diagnosis not present

## 2020-11-11 DIAGNOSIS — J3081 Allergic rhinitis due to animal (cat) (dog) hair and dander: Secondary | ICD-10-CM | POA: Diagnosis not present

## 2020-11-23 ENCOUNTER — Other Ambulatory Visit: Payer: Self-pay | Admitting: Internal Medicine

## 2020-11-24 DIAGNOSIS — J3089 Other allergic rhinitis: Secondary | ICD-10-CM | POA: Diagnosis not present

## 2020-11-24 DIAGNOSIS — J3081 Allergic rhinitis due to animal (cat) (dog) hair and dander: Secondary | ICD-10-CM | POA: Diagnosis not present

## 2020-11-24 DIAGNOSIS — J301 Allergic rhinitis due to pollen: Secondary | ICD-10-CM | POA: Diagnosis not present

## 2020-12-09 DIAGNOSIS — J3089 Other allergic rhinitis: Secondary | ICD-10-CM | POA: Diagnosis not present

## 2020-12-09 DIAGNOSIS — J301 Allergic rhinitis due to pollen: Secondary | ICD-10-CM | POA: Diagnosis not present

## 2020-12-09 DIAGNOSIS — J3081 Allergic rhinitis due to animal (cat) (dog) hair and dander: Secondary | ICD-10-CM | POA: Diagnosis not present

## 2020-12-10 DIAGNOSIS — M25551 Pain in right hip: Secondary | ICD-10-CM | POA: Diagnosis not present

## 2020-12-23 DIAGNOSIS — J3081 Allergic rhinitis due to animal (cat) (dog) hair and dander: Secondary | ICD-10-CM | POA: Diagnosis not present

## 2020-12-23 DIAGNOSIS — J301 Allergic rhinitis due to pollen: Secondary | ICD-10-CM | POA: Diagnosis not present

## 2020-12-23 DIAGNOSIS — J3089 Other allergic rhinitis: Secondary | ICD-10-CM | POA: Diagnosis not present

## 2020-12-25 DIAGNOSIS — J3089 Other allergic rhinitis: Secondary | ICD-10-CM | POA: Diagnosis not present

## 2020-12-25 DIAGNOSIS — J3081 Allergic rhinitis due to animal (cat) (dog) hair and dander: Secondary | ICD-10-CM | POA: Diagnosis not present

## 2020-12-25 DIAGNOSIS — J301 Allergic rhinitis due to pollen: Secondary | ICD-10-CM | POA: Diagnosis not present

## 2020-12-26 DIAGNOSIS — Z23 Encounter for immunization: Secondary | ICD-10-CM | POA: Diagnosis not present

## 2020-12-28 DIAGNOSIS — J3089 Other allergic rhinitis: Secondary | ICD-10-CM | POA: Diagnosis not present

## 2020-12-28 DIAGNOSIS — J3081 Allergic rhinitis due to animal (cat) (dog) hair and dander: Secondary | ICD-10-CM | POA: Diagnosis not present

## 2020-12-28 DIAGNOSIS — J301 Allergic rhinitis due to pollen: Secondary | ICD-10-CM | POA: Diagnosis not present

## 2020-12-31 DIAGNOSIS — J3081 Allergic rhinitis due to animal (cat) (dog) hair and dander: Secondary | ICD-10-CM | POA: Diagnosis not present

## 2020-12-31 DIAGNOSIS — J3089 Other allergic rhinitis: Secondary | ICD-10-CM | POA: Diagnosis not present

## 2020-12-31 DIAGNOSIS — J301 Allergic rhinitis due to pollen: Secondary | ICD-10-CM | POA: Diagnosis not present

## 2021-01-04 DIAGNOSIS — J301 Allergic rhinitis due to pollen: Secondary | ICD-10-CM | POA: Diagnosis not present

## 2021-01-04 DIAGNOSIS — J3089 Other allergic rhinitis: Secondary | ICD-10-CM | POA: Diagnosis not present

## 2021-01-04 DIAGNOSIS — J3081 Allergic rhinitis due to animal (cat) (dog) hair and dander: Secondary | ICD-10-CM | POA: Diagnosis not present

## 2021-01-06 DIAGNOSIS — J3089 Other allergic rhinitis: Secondary | ICD-10-CM | POA: Diagnosis not present

## 2021-01-11 DIAGNOSIS — H1045 Other chronic allergic conjunctivitis: Secondary | ICD-10-CM | POA: Diagnosis not present

## 2021-01-11 DIAGNOSIS — H2513 Age-related nuclear cataract, bilateral: Secondary | ICD-10-CM | POA: Diagnosis not present

## 2021-01-11 DIAGNOSIS — H04123 Dry eye syndrome of bilateral lacrimal glands: Secondary | ICD-10-CM | POA: Diagnosis not present

## 2021-01-11 DIAGNOSIS — H43813 Vitreous degeneration, bilateral: Secondary | ICD-10-CM | POA: Diagnosis not present

## 2021-01-11 DIAGNOSIS — Z9889 Other specified postprocedural states: Secondary | ICD-10-CM | POA: Diagnosis not present

## 2021-01-11 DIAGNOSIS — H401131 Primary open-angle glaucoma, bilateral, mild stage: Secondary | ICD-10-CM | POA: Diagnosis not present

## 2021-01-19 DIAGNOSIS — J3089 Other allergic rhinitis: Secondary | ICD-10-CM | POA: Diagnosis not present

## 2021-01-19 DIAGNOSIS — J301 Allergic rhinitis due to pollen: Secondary | ICD-10-CM | POA: Diagnosis not present

## 2021-01-19 DIAGNOSIS — J3081 Allergic rhinitis due to animal (cat) (dog) hair and dander: Secondary | ICD-10-CM | POA: Diagnosis not present

## 2021-01-28 DIAGNOSIS — J301 Allergic rhinitis due to pollen: Secondary | ICD-10-CM | POA: Diagnosis not present

## 2021-01-28 DIAGNOSIS — H1045 Other chronic allergic conjunctivitis: Secondary | ICD-10-CM | POA: Diagnosis not present

## 2021-01-28 DIAGNOSIS — J3089 Other allergic rhinitis: Secondary | ICD-10-CM | POA: Diagnosis not present

## 2021-01-28 DIAGNOSIS — J3081 Allergic rhinitis due to animal (cat) (dog) hair and dander: Secondary | ICD-10-CM | POA: Diagnosis not present

## 2021-02-03 DIAGNOSIS — J3089 Other allergic rhinitis: Secondary | ICD-10-CM | POA: Diagnosis not present

## 2021-02-03 DIAGNOSIS — J301 Allergic rhinitis due to pollen: Secondary | ICD-10-CM | POA: Diagnosis not present

## 2021-02-03 DIAGNOSIS — J3081 Allergic rhinitis due to animal (cat) (dog) hair and dander: Secondary | ICD-10-CM | POA: Diagnosis not present

## 2021-02-18 DIAGNOSIS — J3089 Other allergic rhinitis: Secondary | ICD-10-CM | POA: Diagnosis not present

## 2021-02-18 DIAGNOSIS — J301 Allergic rhinitis due to pollen: Secondary | ICD-10-CM | POA: Diagnosis not present

## 2021-02-18 DIAGNOSIS — J3081 Allergic rhinitis due to animal (cat) (dog) hair and dander: Secondary | ICD-10-CM | POA: Diagnosis not present

## 2021-03-03 DIAGNOSIS — J301 Allergic rhinitis due to pollen: Secondary | ICD-10-CM | POA: Diagnosis not present

## 2021-03-03 DIAGNOSIS — J3089 Other allergic rhinitis: Secondary | ICD-10-CM | POA: Diagnosis not present

## 2021-03-03 DIAGNOSIS — J3081 Allergic rhinitis due to animal (cat) (dog) hair and dander: Secondary | ICD-10-CM | POA: Diagnosis not present

## 2021-03-12 DIAGNOSIS — Z23 Encounter for immunization: Secondary | ICD-10-CM | POA: Diagnosis not present

## 2021-03-17 DIAGNOSIS — J3081 Allergic rhinitis due to animal (cat) (dog) hair and dander: Secondary | ICD-10-CM | POA: Diagnosis not present

## 2021-03-17 DIAGNOSIS — J3089 Other allergic rhinitis: Secondary | ICD-10-CM | POA: Diagnosis not present

## 2021-03-17 DIAGNOSIS — J301 Allergic rhinitis due to pollen: Secondary | ICD-10-CM | POA: Diagnosis not present

## 2021-04-01 DIAGNOSIS — J3081 Allergic rhinitis due to animal (cat) (dog) hair and dander: Secondary | ICD-10-CM | POA: Diagnosis not present

## 2021-04-01 DIAGNOSIS — J301 Allergic rhinitis due to pollen: Secondary | ICD-10-CM | POA: Diagnosis not present

## 2021-04-01 DIAGNOSIS — J3089 Other allergic rhinitis: Secondary | ICD-10-CM | POA: Diagnosis not present

## 2021-04-03 DIAGNOSIS — Z23 Encounter for immunization: Secondary | ICD-10-CM | POA: Diagnosis not present

## 2021-04-14 DIAGNOSIS — J301 Allergic rhinitis due to pollen: Secondary | ICD-10-CM | POA: Diagnosis not present

## 2021-04-14 DIAGNOSIS — J3081 Allergic rhinitis due to animal (cat) (dog) hair and dander: Secondary | ICD-10-CM | POA: Diagnosis not present

## 2021-04-14 DIAGNOSIS — J3089 Other allergic rhinitis: Secondary | ICD-10-CM | POA: Diagnosis not present

## 2021-04-28 DIAGNOSIS — J3089 Other allergic rhinitis: Secondary | ICD-10-CM | POA: Diagnosis not present

## 2021-04-28 DIAGNOSIS — J3081 Allergic rhinitis due to animal (cat) (dog) hair and dander: Secondary | ICD-10-CM | POA: Diagnosis not present

## 2021-04-28 DIAGNOSIS — J301 Allergic rhinitis due to pollen: Secondary | ICD-10-CM | POA: Diagnosis not present

## 2021-04-29 ENCOUNTER — Other Ambulatory Visit: Payer: Self-pay | Admitting: Internal Medicine

## 2021-05-11 ENCOUNTER — Other Ambulatory Visit: Payer: Self-pay

## 2021-05-11 ENCOUNTER — Other Ambulatory Visit: Payer: Medicare Other | Admitting: Internal Medicine

## 2021-05-11 DIAGNOSIS — Z13220 Encounter for screening for lipoid disorders: Secondary | ICD-10-CM

## 2021-05-11 DIAGNOSIS — R6889 Other general symptoms and signs: Secondary | ICD-10-CM | POA: Diagnosis not present

## 2021-05-11 DIAGNOSIS — E039 Hypothyroidism, unspecified: Secondary | ICD-10-CM | POA: Diagnosis not present

## 2021-05-11 DIAGNOSIS — R7989 Other specified abnormal findings of blood chemistry: Secondary | ICD-10-CM

## 2021-05-11 DIAGNOSIS — Z0000 Encounter for general adult medical examination without abnormal findings: Secondary | ICD-10-CM | POA: Diagnosis not present

## 2021-05-12 DIAGNOSIS — J3089 Other allergic rhinitis: Secondary | ICD-10-CM | POA: Diagnosis not present

## 2021-05-12 DIAGNOSIS — J3081 Allergic rhinitis due to animal (cat) (dog) hair and dander: Secondary | ICD-10-CM | POA: Diagnosis not present

## 2021-05-12 DIAGNOSIS — J301 Allergic rhinitis due to pollen: Secondary | ICD-10-CM | POA: Diagnosis not present

## 2021-05-13 ENCOUNTER — Other Ambulatory Visit: Payer: Self-pay

## 2021-05-13 ENCOUNTER — Encounter: Payer: Self-pay | Admitting: Internal Medicine

## 2021-05-13 ENCOUNTER — Ambulatory Visit (INDEPENDENT_AMBULATORY_CARE_PROVIDER_SITE_OTHER): Payer: Medicare Other | Admitting: Internal Medicine

## 2021-05-13 VITALS — BP 102/64 | HR 66 | Temp 97.5°F | Ht 66.25 in | Wt 131.5 lb

## 2021-05-13 DIAGNOSIS — Z789 Other specified health status: Secondary | ICD-10-CM | POA: Diagnosis not present

## 2021-05-13 DIAGNOSIS — R6889 Other general symptoms and signs: Secondary | ICD-10-CM | POA: Diagnosis not present

## 2021-05-13 DIAGNOSIS — Z0000 Encounter for general adult medical examination without abnormal findings: Secondary | ICD-10-CM | POA: Diagnosis not present

## 2021-05-13 DIAGNOSIS — K5904 Chronic idiopathic constipation: Secondary | ICD-10-CM

## 2021-05-13 DIAGNOSIS — Z8719 Personal history of other diseases of the digestive system: Secondary | ICD-10-CM | POA: Diagnosis not present

## 2021-05-13 DIAGNOSIS — J3081 Allergic rhinitis due to animal (cat) (dog) hair and dander: Secondary | ICD-10-CM

## 2021-05-13 NOTE — Patient Instructions (Addendum)
Reminded about mammogram and colonoscopy.  Continue current medications.  Return in 1 year or as needed.  It was a pleasure to see you today.  Regarding forgetfulness.  B12 and folate were ordered.  The studies were both normal.

## 2021-05-13 NOTE — Progress Notes (Deleted)
° °  Subjective:    Patient ID: Chelsea Cantrell, female    DOB: 03-May-1946, 75 y.o.   MRN: [id number]  HPI    Review of Systems     Objective:   Physical Exam        Assessment & Plan:

## 2021-05-13 NOTE — Progress Notes (Signed)
Annual Wellness Visit     Patient: Chelsea Cantrell, Female    DOB: Oct 16, 1945, 75 y.o.   MRN: [id number] Visit Date: 05/13/2021  Chief Complaint  Patient presents with   Medicare Wellness   Subjective    Barbar Brede is a 75 y.o. Female who presents today for her Annual Wellness Visit.  HPI she also presents for annual health maintenance exam and evaluation of medical issues. She feels well and has no new complaints.  She is followed at [facility].  History of allergies to dogs, cats and pollen.  She takes allergy injections.  History of hypothyroidism.  History of constipation for which she has taken Linzess and sees Dr. Earlean Shawl.  History of recurrent pancreatitis and has not had episode in several years.  Had recurrent episodes in 2016.  A stent was placed in her bile duct/pancreatic duct at [facility] in June 2016 and she had a sphincterotomy.  The stent was subsequently removed by Dr. Earlean Shawl and she has had no further issues.  History of hypothyroidism and takes thyroid replacement medication.  History of GE reflux, exercise-induced asthma, Schatzki's ring, hyperplastic colon polyps.  She has taken estrogen replacement.  She was tried on Strattera in 2003 for attention deficit disorder but currently does not take medication for attention deficit.  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  She had another epidural steroid injection in January 2019 but did not feel that it helped.  The pain returned after 3 weeks and she did not repeat the injection.  Currently this is not an issue.  Social history: Single, never married.  No children.  Retired is an Biomedical engineer.  Did smoke prior to 1976 but currently a non-smoker.  She is a vegetarian.  She drinks wine socially.  Exercises routinely.  Family history: Father died at age 28 of lung cancer with history  of MI at age 53.  Father also had hyperlipidemia.  Mother died at age 19 but had stroke 4 years before she passed away.  1 brother and 1 sister in good health.       Patient Care Team: Elby Showers, MD as PCP - General (Internal Medicine)  Review of Systems no complaints   Objective    Vitals: BP 102/64    Pulse 66    Temp (!) 97.5 F (36.4 C) (Tympanic)    Ht 5' 6.25" (1.683 m)    Wt 131 lb 8 oz (59.6 kg)    SpO2 98%    BMI 21.06 kg/m   Physical Exam Vitals reviewed.  Constitutional:      General: She is not in acute distress.    Appearance: Normal appearance.  HENT:     Head: Normocephalic and atraumatic.     Right Ear: Tympanic membrane normal.     Left Ear: Tympanic membrane normal.     Nose: Nose normal.     Mouth/Throat:     Pharynx: Oropharynx is clear.  Eyes:     General: No scleral icterus.       Right eye: No discharge.        Left eye: No discharge.     Extraocular Movements: Extraocular movements intact.     Pupils: Pupils are equal, round, and reactive to light.  Neck:     Vascular: No carotid bruit.  Cardiovascular:     Rate and  Rhythm: Normal rate and regular rhythm.     Pulses: Normal pulses.     Heart sounds: Normal heart sounds. No murmur heard.    Comments: Breasts are without masses Pulmonary:     Effort: Pulmonary effort is normal.     Breath sounds: Normal breath sounds.  Abdominal:     General: Bowel sounds are normal.     Palpations: Abdomen is soft. There is no mass.     Tenderness: There is no abdominal tenderness. There is no right CVA tenderness, left CVA tenderness or guarding.  Genitourinary:    Comments: Pap deferred due to age. Musculoskeletal:     Cervical back: Neck supple.     Right lower leg: No edema.     Left lower leg: No edema.  Lymphadenopathy:     Cervical: No cervical adenopathy.  Skin:    General: Skin is warm and dry.  Neurological:     General: No focal deficit present.     Mental Status: She is alert and  oriented to person, place, and time.     Cranial Nerves: No cranial nerve deficit.     Sensory: No sensory deficit.     Motor: No weakness.     Coordination: Coordination normal.     Gait: Gait normal.  Psychiatric:        Mood and Affect: Mood normal.        Behavior: Behavior normal.        Thought Content: Thought content normal.        Judgment: Judgment normal.   Hypothyroidism-TSH is normal on thyroid replacement  Seasonal affective disorder-stable and currently not on medication  History of allergic rhinitis and exercise-induced asthma treated by allergist  Remote history of pancreatitis requiring stent placement but no recurrent episodes in several years.  She had sphincterotomy  Constipation treated with Linzess  Hyperlipidemia-4 years ago LDL was 118.  Lipids are now completely normal  Patient complains of forgetfulness.  B12 and folate were ordered.  It does not appear to be significant after discussing it with her.  Plan: Return in 1 year or as needed.   Continue current medications.  Reminded about colonoscopy and mammogram  Most recent functional status assessment: In your present state of health, do you have any difficulty performing the following activities: 05/13/2021  Hearing? N  Vision? N  Difficulty concentrating or making decisions? N  Walking or climbing stairs? N  Dressing or bathing? N  Doing errands, shopping? N  Preparing Food and eating ? N  Using the Toilet? N  In the past six months, have you accidently leaked urine? N  Do you have problems with loss of bowel control? N  Managing your Medications? N  Managing your Finances? N  Some recent data might be hidden   Most recent fall risk assessment: Fall Risk  05/13/2021  Falls in the past year? 0  Comment -  Number falls in past yr: 0  Injury with Fall? 0  Risk for fall due to : No Fall Risks  Follow up Falls evaluation completed    Most recent depression screenings: PHQ 2/9 Scores  05/13/2021 05/12/2020  PHQ - 2 Score 0 0   Most recent cognitive screening: 6CIT Screen 05/13/2021  What Year? 0 points  What month? 0 points  What time? 0 points  Count back from 20 0 points  Months in reverse 0 points  Repeat phrase 2 points  Total Score 2  Assessment & Plan     Annual wellness visit done today including the all of the following: Reviewed patient's Family Medical History Reviewed and updated list of patient's medical providers Assessment of cognitive impairment was done Assessed patient's functional ability Established a written schedule for health screening [place] Completed and Reviewed  Discussed health benefits of physical activity, and encouraged her to engage in regular exercise appropriate for her age and condition.         IElby Showers, MD, have reviewed all documentation for this visit. The documentation on 06/19/21 for the exam, diagnosis, procedures, and orders are all accurate and complete.   Angus Seller, CMA

## 2021-05-14 LAB — CBC WITH DIFFERENTIAL/PLATELET
Absolute Monocytes: 479 cells/uL (ref 200–950)
Basophils Absolute: 72 cells/uL (ref 0–200)
Basophils Relative: 1.3 %
Eosinophils Absolute: 259 cells/uL (ref 15–500)
Eosinophils Relative: 4.7 %
HCT: 39.3 % (ref 35.0–45.0)
Hemoglobin: 13 g/dL (ref 11.7–15.5)
Lymphs Abs: 1095 cells/uL (ref 850–3900)
MCH: 29.3 pg (ref 27.0–33.0)
MCHC: 33.1 g/dL (ref 32.0–36.0)
MCV: 88.5 fL (ref 80.0–100.0)
MPV: 9.3 fL (ref 7.5–12.5)
Monocytes Relative: 8.7 %
Neutro Abs: 3597 cells/uL (ref 1500–7800)
Neutrophils Relative %: 65.4 %
Platelets: 308 10*3/uL (ref 140–400)
RBC: 4.44 10*6/uL (ref 3.80–5.10)
RDW: 11.8 % (ref 11.0–15.0)
Total Lymphocyte: 19.9 %
WBC: 5.5 10*3/uL (ref 3.8–10.8)

## 2021-05-14 LAB — COMPLETE METABOLIC PANEL WITH GFR
AG Ratio: 2.3 (calc) (ref 1.0–2.5)
ALT: 21 U/L (ref 6–29)
AST: 25 U/L (ref 10–35)
Albumin: 4.3 g/dL (ref 3.6–5.1)
Alkaline phosphatase (APISO): 50 U/L (ref 37–153)
BUN/Creatinine Ratio: 27 (calc) — ABNORMAL HIGH (ref 6–22)
BUN: 26 mg/dL — ABNORMAL HIGH (ref 7–25)
CO2: 27 mmol/L (ref 20–32)
Calcium: 9.2 mg/dL (ref 8.6–10.4)
Chloride: 102 mmol/L (ref 98–110)
Creat: 0.97 mg/dL (ref 0.60–1.00)
Globulin: 1.9 g/dL (calc) (ref 1.9–3.7)
Glucose, Bld: 86 mg/dL (ref 65–99)
Potassium: 4.2 mmol/L (ref 3.5–5.3)
Sodium: 139 mmol/L (ref 135–146)
Total Bilirubin: 0.6 mg/dL (ref 0.2–1.2)
Total Protein: 6.2 g/dL (ref 6.1–8.1)
eGFR: 62 mL/min/{1.73_m2} (ref >=60)

## 2021-05-14 LAB — LIPID PANEL
Cholesterol: 175 mg/dL (ref <200)
HDL: 62 mg/dL (ref >=50)
LDL Cholesterol (Calc): 98 mg/dL (calc)
Non-HDL Cholesterol (Calc): 113 mg/dL (calc) (ref <130)
Total CHOL/HDL Ratio: 2.8 (calc) (ref <5.0)
Triglycerides: 64 mg/dL (ref <150)

## 2021-05-14 LAB — TEST AUTHORIZATION

## 2021-05-14 LAB — B12 AND FOLATE PANEL
Folate: 9.7 ng/mL
Vitamin B-12: 914 pg/mL (ref 200–1100)

## 2021-05-14 LAB — TSH: TSH: 2.69 mIU/L (ref 0.40–4.50)

## 2021-05-26 DIAGNOSIS — J3081 Allergic rhinitis due to animal (cat) (dog) hair and dander: Secondary | ICD-10-CM | POA: Diagnosis not present

## 2021-05-26 DIAGNOSIS — J301 Allergic rhinitis due to pollen: Secondary | ICD-10-CM | POA: Diagnosis not present

## 2021-05-26 DIAGNOSIS — J3089 Other allergic rhinitis: Secondary | ICD-10-CM | POA: Diagnosis not present

## 2021-06-09 DIAGNOSIS — J3089 Other allergic rhinitis: Secondary | ICD-10-CM | POA: Diagnosis not present

## 2021-06-09 DIAGNOSIS — J3081 Allergic rhinitis due to animal (cat) (dog) hair and dander: Secondary | ICD-10-CM | POA: Diagnosis not present

## 2021-06-09 DIAGNOSIS — J301 Allergic rhinitis due to pollen: Secondary | ICD-10-CM | POA: Diagnosis not present

## 2021-06-23 DIAGNOSIS — J3081 Allergic rhinitis due to animal (cat) (dog) hair and dander: Secondary | ICD-10-CM | POA: Diagnosis not present

## 2021-06-23 DIAGNOSIS — J301 Allergic rhinitis due to pollen: Secondary | ICD-10-CM | POA: Diagnosis not present

## 2021-06-23 DIAGNOSIS — J3089 Other allergic rhinitis: Secondary | ICD-10-CM | POA: Diagnosis not present

## 2021-07-07 DIAGNOSIS — J3081 Allergic rhinitis due to animal (cat) (dog) hair and dander: Secondary | ICD-10-CM | POA: Diagnosis not present

## 2021-07-07 DIAGNOSIS — J3089 Other allergic rhinitis: Secondary | ICD-10-CM | POA: Diagnosis not present

## 2021-07-07 DIAGNOSIS — J301 Allergic rhinitis due to pollen: Secondary | ICD-10-CM | POA: Diagnosis not present

## 2021-07-13 DIAGNOSIS — H1045 Other chronic allergic conjunctivitis: Secondary | ICD-10-CM | POA: Diagnosis not present

## 2021-07-13 DIAGNOSIS — H401131 Primary open-angle glaucoma, bilateral, mild stage: Secondary | ICD-10-CM | POA: Diagnosis not present

## 2021-07-13 DIAGNOSIS — H2513 Age-related nuclear cataract, bilateral: Secondary | ICD-10-CM | POA: Diagnosis not present

## 2021-07-13 DIAGNOSIS — H43813 Vitreous degeneration, bilateral: Secondary | ICD-10-CM | POA: Diagnosis not present

## 2021-07-13 DIAGNOSIS — H04123 Dry eye syndrome of bilateral lacrimal glands: Secondary | ICD-10-CM | POA: Diagnosis not present

## 2021-07-21 DIAGNOSIS — J301 Allergic rhinitis due to pollen: Secondary | ICD-10-CM | POA: Diagnosis not present

## 2021-07-21 DIAGNOSIS — J3081 Allergic rhinitis due to animal (cat) (dog) hair and dander: Secondary | ICD-10-CM | POA: Diagnosis not present

## 2021-07-21 DIAGNOSIS — J3089 Other allergic rhinitis: Secondary | ICD-10-CM | POA: Diagnosis not present

## 2021-08-05 DIAGNOSIS — J3081 Allergic rhinitis due to animal (cat) (dog) hair and dander: Secondary | ICD-10-CM | POA: Diagnosis not present

## 2021-08-05 DIAGNOSIS — J301 Allergic rhinitis due to pollen: Secondary | ICD-10-CM | POA: Diagnosis not present

## 2021-08-05 DIAGNOSIS — J3089 Other allergic rhinitis: Secondary | ICD-10-CM | POA: Diagnosis not present

## 2021-08-19 DIAGNOSIS — J3081 Allergic rhinitis due to animal (cat) (dog) hair and dander: Secondary | ICD-10-CM | POA: Diagnosis not present

## 2021-08-19 DIAGNOSIS — J301 Allergic rhinitis due to pollen: Secondary | ICD-10-CM | POA: Diagnosis not present

## 2021-08-19 DIAGNOSIS — J3089 Other allergic rhinitis: Secondary | ICD-10-CM | POA: Diagnosis not present

## 2021-09-01 DIAGNOSIS — J3081 Allergic rhinitis due to animal (cat) (dog) hair and dander: Secondary | ICD-10-CM | POA: Diagnosis not present

## 2021-09-01 DIAGNOSIS — J301 Allergic rhinitis due to pollen: Secondary | ICD-10-CM | POA: Diagnosis not present

## 2021-09-01 DIAGNOSIS — J3089 Other allergic rhinitis: Secondary | ICD-10-CM | POA: Diagnosis not present

## 2021-09-15 DIAGNOSIS — J301 Allergic rhinitis due to pollen: Secondary | ICD-10-CM | POA: Diagnosis not present

## 2021-09-15 DIAGNOSIS — J3081 Allergic rhinitis due to animal (cat) (dog) hair and dander: Secondary | ICD-10-CM | POA: Diagnosis not present

## 2021-09-15 DIAGNOSIS — J3089 Other allergic rhinitis: Secondary | ICD-10-CM | POA: Diagnosis not present

## 2021-10-04 DIAGNOSIS — J3089 Other allergic rhinitis: Secondary | ICD-10-CM | POA: Diagnosis not present

## 2021-10-04 DIAGNOSIS — J301 Allergic rhinitis due to pollen: Secondary | ICD-10-CM | POA: Diagnosis not present

## 2021-10-04 DIAGNOSIS — J3081 Allergic rhinitis due to animal (cat) (dog) hair and dander: Secondary | ICD-10-CM | POA: Diagnosis not present

## 2021-10-07 ENCOUNTER — Other Ambulatory Visit: Payer: Self-pay | Admitting: Internal Medicine

## 2021-10-07 DIAGNOSIS — J3089 Other allergic rhinitis: Secondary | ICD-10-CM | POA: Diagnosis not present

## 2021-10-07 DIAGNOSIS — J301 Allergic rhinitis due to pollen: Secondary | ICD-10-CM | POA: Diagnosis not present

## 2021-10-07 DIAGNOSIS — J3081 Allergic rhinitis due to animal (cat) (dog) hair and dander: Secondary | ICD-10-CM | POA: Diagnosis not present

## 2021-10-13 DIAGNOSIS — J3081 Allergic rhinitis due to animal (cat) (dog) hair and dander: Secondary | ICD-10-CM | POA: Diagnosis not present

## 2021-10-13 DIAGNOSIS — J301 Allergic rhinitis due to pollen: Secondary | ICD-10-CM | POA: Diagnosis not present

## 2021-10-13 DIAGNOSIS — J3089 Other allergic rhinitis: Secondary | ICD-10-CM | POA: Diagnosis not present

## 2021-10-27 DIAGNOSIS — J3081 Allergic rhinitis due to animal (cat) (dog) hair and dander: Secondary | ICD-10-CM | POA: Diagnosis not present

## 2021-10-27 DIAGNOSIS — J3089 Other allergic rhinitis: Secondary | ICD-10-CM | POA: Diagnosis not present

## 2021-10-27 DIAGNOSIS — J301 Allergic rhinitis due to pollen: Secondary | ICD-10-CM | POA: Diagnosis not present

## 2021-11-02 DIAGNOSIS — J3089 Other allergic rhinitis: Secondary | ICD-10-CM | POA: Diagnosis not present

## 2021-11-02 DIAGNOSIS — J301 Allergic rhinitis due to pollen: Secondary | ICD-10-CM | POA: Diagnosis not present

## 2021-11-02 DIAGNOSIS — J3081 Allergic rhinitis due to animal (cat) (dog) hair and dander: Secondary | ICD-10-CM | POA: Diagnosis not present

## 2021-11-05 DIAGNOSIS — J3081 Allergic rhinitis due to animal (cat) (dog) hair and dander: Secondary | ICD-10-CM | POA: Diagnosis not present

## 2021-11-05 DIAGNOSIS — J301 Allergic rhinitis due to pollen: Secondary | ICD-10-CM | POA: Diagnosis not present

## 2021-11-05 DIAGNOSIS — J3089 Other allergic rhinitis: Secondary | ICD-10-CM | POA: Diagnosis not present

## 2021-11-17 DIAGNOSIS — J301 Allergic rhinitis due to pollen: Secondary | ICD-10-CM | POA: Diagnosis not present

## 2021-11-17 DIAGNOSIS — J3081 Allergic rhinitis due to animal (cat) (dog) hair and dander: Secondary | ICD-10-CM | POA: Diagnosis not present

## 2021-11-17 DIAGNOSIS — J3089 Other allergic rhinitis: Secondary | ICD-10-CM | POA: Diagnosis not present

## 2021-11-24 DIAGNOSIS — J3081 Allergic rhinitis due to animal (cat) (dog) hair and dander: Secondary | ICD-10-CM | POA: Diagnosis not present

## 2021-11-24 DIAGNOSIS — J3089 Other allergic rhinitis: Secondary | ICD-10-CM | POA: Diagnosis not present

## 2021-11-24 DIAGNOSIS — J301 Allergic rhinitis due to pollen: Secondary | ICD-10-CM | POA: Diagnosis not present

## 2021-12-13 ENCOUNTER — Telehealth: Payer: Self-pay

## 2021-12-13 NOTE — Telephone Encounter (Signed)
Patient would like to know if she can do a video visit with you. She states that she is having memory issues and a lot of gas. Please advise.

## 2021-12-14 NOTE — Telephone Encounter (Signed)
Appt 7/27

## 2021-12-15 DIAGNOSIS — J3081 Allergic rhinitis due to animal (cat) (dog) hair and dander: Secondary | ICD-10-CM | POA: Diagnosis not present

## 2021-12-15 DIAGNOSIS — J301 Allergic rhinitis due to pollen: Secondary | ICD-10-CM | POA: Diagnosis not present

## 2021-12-15 DIAGNOSIS — J3089 Other allergic rhinitis: Secondary | ICD-10-CM | POA: Diagnosis not present

## 2021-12-16 ENCOUNTER — Ambulatory Visit (INDEPENDENT_AMBULATORY_CARE_PROVIDER_SITE_OTHER): Payer: Medicare Other | Admitting: Internal Medicine

## 2021-12-16 ENCOUNTER — Encounter: Payer: Self-pay | Admitting: Internal Medicine

## 2021-12-16 VITALS — BP 116/72 | HR 67 | Temp 98.1°F

## 2021-12-16 DIAGNOSIS — J301 Allergic rhinitis due to pollen: Secondary | ICD-10-CM

## 2021-12-16 DIAGNOSIS — Z789 Other specified health status: Secondary | ICD-10-CM | POA: Diagnosis not present

## 2021-12-16 DIAGNOSIS — R413 Other amnesia: Secondary | ICD-10-CM | POA: Diagnosis not present

## 2021-12-16 DIAGNOSIS — R6889 Other general symptoms and signs: Secondary | ICD-10-CM

## 2021-12-17 LAB — T4, FREE: Free T4: 1.1 ng/dL (ref 0.8–1.8)

## 2021-12-17 LAB — TSH: TSH: 2.91 mIU/L (ref 0.40–4.50)

## 2021-12-17 LAB — B12 AND FOLATE PANEL
Folate: >24 ng/mL
Vitamin B-12: 1267 pg/mL — ABNORMAL HIGH (ref 200–1100)

## 2021-12-18 NOTE — Patient Instructions (Signed)
We have checked for B12 deficiency and folate deficiency as well as thyroid functions.  If you are taking B12 supplement over-the-counter please cut back a bit as the level is slightly high and likely due to over replacement with supplement.  Please have your friend continue to monitor your symptoms and let us know if you have further concerns over the next couple of months.  Your health maintenance exam and Medicare wellness visit are due in December and we can follow-up at that time.  We are happy to make Neurology referral if you or your friend thinks that is needed.  Your friend is a good monitor for you as she knows you well.

## 2021-12-18 NOTE — Progress Notes (Signed)
   Subjective:    Patient ID: Chelsea Cantrell, female    DOB: Oct 12, 1945, 76 y.o.   MRN: [id number]  HPI 76 year old Female seen today for concern about her memory.  Patient was recently with a friend having lunch at [facility] and could not remember when she went to the cash register to tell the cashier when she had ordered/are  Her friend thought this was unusual and asked her to come to the doctor to be checked.  Patient says she has noted occasionally she has some issues with her memory but did not think it was anything significant.  Has not really gotten lost.  Did have to think about where this office was located from her home today in order to get here but did not have any issues getting here she is status.  Patient has been retired for a few years.  She lives alone.  Has not had any falls or head injuries.  She has a history of allergic rhinitis.  She takes allergy injections.  She had Medicare annual wellness visit in December 2022.  History of hypothyroidism treated with thyroid replacement medication.  She was tried on Strattera in 2003 for attention deficit disorder but currently does not take medication for attention deficit.  She drinks wine socially.  Currently a non-smoker but did smoke prior to 1976.  She is a vegetarian.  She is single, never married and resides alone.  She retired as an Programme researcher, broadcasting/film/video for [employer].  Free T4 and TSH checked today are normal.  Her B12 is 1267 and actually above normal limits.  She should cut back some on her B12 supplement.  Folate is greater than 24 and normal.  We discussed referral to Neurology and possibly for neuropsychological testing for memory loss.  She will consider it.  Right now she says she feels okay.  She is due for Medicare wellness visit in December and we can follow-up at that time.    Review of Systems denies recent fall, headache, head injury, numbness, tingling, paresthesias, weakness of extremities     Objective:   Physical  Exam Blood pressure 116/72 pulse 67 temperature 98.1 degrees pulse oximetry 98% Cranial nerves II through XII are grossly intact.  PERRLA.  TMs clear.  Pharynx is clear.  Chest clear.  Cardiac exam: Regular rate and rhythm.  Muscle strength is normal in the upper and lower extremities.  Gait is normal.  Affect is normal.      Assessment & Plan:  She has a history of mild attention deficit disorder in the remote past but does not take medication for it.  I have suggested that her friend keep an eye on her and let us know if she has further memory concerns and we can arrange for neurology consultation, MRI of the brain and neuropsychological testing.  She has upcoming physical exam and Medicare wellness visit in December and we will follow-up at that time.  Her B12 level is actually over replaced and she should cut back on this supplement a bit.  Folate is normal.  Free T4 and TSH are normal today.

## 2021-12-20 ENCOUNTER — Encounter: Payer: Self-pay | Admitting: Psychology

## 2021-12-20 ENCOUNTER — Telehealth: Payer: Self-pay | Admitting: Internal Medicine

## 2021-12-20 NOTE — Telephone Encounter (Signed)
Chelsea Cantrell called back today to say she does want to proceed with getting a memory test thru neurologist.

## 2021-12-20 NOTE — Progress Notes (Signed)
Chelsea Cantrell was put on wait list for Dr Sima Matas. Referral has been placed for [facility], patient was agreeable to this, per our conversation.

## 2022-01-04 DIAGNOSIS — Z1231 Encounter for screening mammogram for malignant neoplasm of breast: Secondary | ICD-10-CM | POA: Diagnosis not present

## 2022-01-04 LAB — HM MAMMOGRAPHY

## 2022-01-06 DIAGNOSIS — J3089 Other allergic rhinitis: Secondary | ICD-10-CM | POA: Diagnosis not present

## 2022-01-06 DIAGNOSIS — J301 Allergic rhinitis due to pollen: Secondary | ICD-10-CM | POA: Diagnosis not present

## 2022-01-06 DIAGNOSIS — J3081 Allergic rhinitis due to animal (cat) (dog) hair and dander: Secondary | ICD-10-CM | POA: Diagnosis not present

## 2022-01-12 ENCOUNTER — Encounter: Payer: Self-pay | Admitting: Internal Medicine

## 2022-01-18 DIAGNOSIS — H1045 Other chronic allergic conjunctivitis: Secondary | ICD-10-CM | POA: Diagnosis not present

## 2022-01-18 DIAGNOSIS — H43813 Vitreous degeneration, bilateral: Secondary | ICD-10-CM | POA: Diagnosis not present

## 2022-01-18 DIAGNOSIS — H2513 Age-related nuclear cataract, bilateral: Secondary | ICD-10-CM | POA: Diagnosis not present

## 2022-01-18 DIAGNOSIS — H04123 Dry eye syndrome of bilateral lacrimal glands: Secondary | ICD-10-CM | POA: Diagnosis not present

## 2022-01-18 DIAGNOSIS — H401131 Primary open-angle glaucoma, bilateral, mild stage: Secondary | ICD-10-CM | POA: Diagnosis not present

## 2022-01-25 ENCOUNTER — Ambulatory Visit (INDEPENDENT_AMBULATORY_CARE_PROVIDER_SITE_OTHER): Payer: Medicare Other | Admitting: Diagnostic Neuroimaging

## 2022-01-25 VITALS — BP 131/71 | HR 59 | Ht 68.0 in | Wt 139.1 lb

## 2022-01-25 DIAGNOSIS — R413 Other amnesia: Secondary | ICD-10-CM | POA: Diagnosis not present

## 2022-01-25 NOTE — Progress Notes (Unsigned)
[facility]  PATIENT: Chelsea Cantrell DOB: January 20, 1946  REFERRING CLINICIAN: Elby Showers, MD HISTORY FROM: patient REASON FOR VISIT: new consult   HISTORICAL  CHIEF COMPLAINT:  Chief Complaint  Patient presents with   New Patient (Initial Visit)    Pt is feeling fine. She states she is experiencing forgetfulness. She states that she worries a lot. Room 7 alone    HISTORY OF PRESENT ILLNESS:   76 year old female here for evaluation of memory loss.  Patient notes some mild attention and memory loss issues, forgetfulness.  For example she had trouble getting to our office today, but mentioned that GPS did not recognize the correct street address.  When she uses her GPS she is able to get to any location without difficulty.  She only has problems when she does not use GPS and tries to drive to unfamiliar locations.  Has been having some increased stress issues.  She has been having to write things down more often.  Has some chronic tinnitus issues.  She also tends to be a worrier with some anxiety tendencies.   REVIEW OF SYSTEMS: Full 14 system review of systems performed and negative with exception of: as per HPI.  ALLERGIES: Allergies  Allergen Reactions   Other Other (See Comments)    Environmental allergies/gets allergy injections    HOME MEDICATIONS: Outpatient Medications Prior to Visit  Medication Sig Dispense Refill   Coenzyme Q10 (CO Q 10 PO) Take by mouth.     Digestive Enzymes CAPS Take by mouth.     EPINEPHrine 0.3 mg/0.3 mL IJ SOAJ injection INJECT INTRAMUSCULARLY AS DIRECTED     loratadine (CLARITIN) 10 MG tablet Take by mouth.     Multiple Vitamin (MULTIVITAMIN) tablet Take 1 tablet by mouth daily.     Omega-3 Fatty Acids (FISH OIL) 1000 MG CAPS Take by mouth.     polyethylene glycol (MIRALAX / GLYCOLAX) packet Take 17 g by mouth 2 (two) times daily.     Probiotic Product (PROBIOTIC-10 PO) Take by mouth.     Spacer/Aero-Holding Chambers  (OPTICHAMBER DIAMOND) MISC -     SYMBICORT 80-4.5 MCG/ACT inhaler Inhale into the lungs.     SYNTHROID 50 MCG tablet TAKE 1 TABLET(50 MCG) BY MOUTH DAILY 90 tablet 1   VENTOLIN HFA 108 (90 BASE) MCG/ACT inhaler Inhale 2 puffs into the lungs every 6 (six) hours as needed for wheezing or shortness of breath.      No facility-administered medications prior to visit.    PAST MEDICAL HISTORY: Past Medical History:  Diagnosis Date   Allergy    Arthritis    Asthma    exercise induced   Colon polyps    Constipation    IBS (irritable bowel syndrome)    Osteoporosis 03/2015   T score -2.6 left forearm. Left femoral neck -1.4, AP spine 0.1   Pancreatitis    Schatzki's ring    Seasonal affective disorder (HCC)    Spinal stenosis    Thyroid disease    Hypo    PAST SURGICAL HISTORY: Past Surgical History:  Procedure Laterality Date   EUS N/A 06/12/2014   Procedure: UPPER ENDOSCOPIC ULTRASOUND (EUS) LINEAR;  Surgeon: Beryle Beams, MD;  Location: [facility];  Service: Endoscopy;  Laterality: N/A;   FOOT SURGERY Left    HIP SURGERY Right 2001   Replacement   JOINT REPLACEMENT     TONSILLECTOMY      FAMILY HISTORY: Family History  Problem Relation Age of  Onset   Stroke Mother    Osteoporosis Mother    Heart disease Father    Hyperlipidemia Father    Lung cancer Father     SOCIAL HISTORY: Social History   Socioeconomic History   Marital status: Single    Spouse name: Not on file   Number of children: Not on file   Years of education: Not on file   Highest education level: Not on file  Occupational History   Not on file  Tobacco Use   Smoking status: Former    Types: Cigarettes    Quit date: 05/23/1974    Years since quitting: 47.7   Smokeless tobacco: Never  Substance and Sexual Activity   Alcohol use: Yes    Alcohol/week: 3.0 standard drinks of alcohol    Types: 3 Glasses of wine per week   Drug use: No   Sexual activity: Not Currently    Birth  control/protection: Post-menopausal    Comment: 1st intercourse 76 yo-More than 5 partners  Other Topics Concern   Not on file  Social History Narrative   Social history: Single, never married.  No children.  Retired as an Programme researcher, broadcasting/film/video for [employer].  Did smoke prior to 1976 but currently non-smoker.  She is a vegetarian.  She drinks wine.  Exercises routinely.       Family history: Father died at age 24 of lung cancer with history of MI at age 30.  Father also has hyperlipidemia.  Mother died at age 28 but had a stroke 4 years before she passed away.  1 brother and 1 sister in good health.       Social Determinants of Health   Financial Resource Strain: Not on file  Food Insecurity: Not on file  Transportation Needs: Not on file  Physical Activity: Not on file  Stress: Not on file  Social Connections: Not on file  Intimate Partner Violence: Not on file     PHYSICAL EXAM  GENERAL EXAM/CONSTITUTIONAL: Vitals:  Vitals:   01/25/22 0958  BP: 131/71  Pulse: (!) 59  Weight: 139 lb 2 oz (63.1 kg)  Height: '5\' 8"'$  (1.727 m)   Body mass index is 21.15 kg/m. Wt Readings from Last 3 Encounters:  01/25/22 139 lb 2 oz (63.1 kg)  05/13/21 131 lb 8 oz (59.6 kg)  05/12/20 126 lb (57.2 kg)   Patient is in no distress; well developed, nourished and groomed; neck is supple  CARDIOVASCULAR: Examination of carotid arteries is normal; no carotid bruits Regular rate and rhythm, no murmurs Examination of peripheral vascular system by observation and palpation is normal  EYES: Ophthalmoscopic exam of optic discs and posterior segments is normal; no papilledema or hemorrhages No results found.  MUSCULOSKELETAL: Gait, strength, tone, movements noted in Neurologic exam below  NEUROLOGIC: MENTAL STATUS:      No data to display         awake, alert, oriented to person, place and time recent and remote memory intact normal attention and concentration language fluent, comprehension  intact, naming intact fund of knowledge appropriate  CRANIAL NERVE:  2nd - no papilledema on fundoscopic exam 2nd, 3rd, 4th, 6th - pupils equal and reactive to light, visual fields full to confrontation, extraocular muscles intact, no nystagmus 5th - facial sensation symmetric 7th - facial strength symmetric 8th - hearing intact 9th - palate elevates symmetrically, uvula midline 11th - shoulder shrug symmetric 12th - tongue protrusion midline  MOTOR:  normal bulk and tone, full strength in  the BUE, BLE  SENSORY:  normal and symmetric to light touch, temperature, vibration  COORDINATION:  finger-nose-finger, fine finger movements normal  REFLEXES:  deep tendon reflexes present and symmetric  GAIT/STATION:  narrow based gait     DIAGNOSTIC DATA (LABS, IMAGING, TESTING) - I reviewed patient records, labs, notes, testing and imaging myself where available.  Lab Results  Component Value Date   WBC 5.5 05/11/2021   HGB 13.0 05/11/2021   HCT 39.3 05/11/2021   MCV 88.5 05/11/2021   PLT 308 05/11/2021      Component Value Date/Time   NA 139 05/11/2021 0925   K 4.2 05/11/2021 0925   CL 102 05/11/2021 0925   CO2 27 05/11/2021 0925   GLUCOSE 86 05/11/2021 0925   BUN 26 (H) 05/11/2021 0925   CREATININE 0.97 05/11/2021 0925   CALCIUM 9.2 05/11/2021 0925   PROT 6.2 05/11/2021 0925   ALBUMIN 4.2 04/25/2016 1000   AST 25 05/11/2021 0925   ALT 21 05/11/2021 0925   ALKPHOS 56 04/25/2016 1000   BILITOT 0.6 05/11/2021 0925   GFRNONAA 63 05/07/2020 0930   GFRAA 73 05/07/2020 0930   Lab Results  Component Value Date   CHOL 175 05/11/2021   HDL 62 05/11/2021   LDLCALC 98 05/11/2021   TRIG 64 05/11/2021   CHOLHDL 2.8 05/11/2021   No results found for: "HGBA1C" Lab Results  Component Value Date   VITAMINB12 1,267 (H) 12/16/2021   Lab Results  Component Value Date   TSH 2.91 12/16/2021       ASSESSMENT AND PLAN  76 y.o. year old female here  with:   Dx:  1. Memory loss     PLAN:  MILD MEMORY LOSS (MCI vs normal aging vs ADD vs mild anxiety) - check MRI brain  - safety / supervision issues reviewed - daily physical activity / exercise (at least 15-30 minutes) - eat more plants / vegetables - increase social activities, brain stimulation, games, puzzles, hobbies, crafts, arts, music - aim for at least 7-8 hours sleep per night (or more)  Orders Placed This Encounter  Procedures   MR [place]   Return for pending test results, pending if symptoms worsen or fail to improve.    Penni Bombard, MD 11/22/4285, 68:11 AM Certified in Neurology, Neurophysiology and Neuroimaging  [facility] [address] [place], [place] [address] [phone]

## 2022-01-25 NOTE — Patient Instructions (Signed)
  MILD MEMORY LOSS (MCI vs normal aging vs ADD vs mild anxiety) - check MRI brain  - safety / supervision issues reviewed - daily physical activity / exercise (at least 15-30 minutes) - eat more plants / vegetables - increase social activities, brain stimulation, games, puzzles, hobbies, crafts, arts, music - aim for at least 7-8 hours sleep per night (or more)

## 2022-01-26 ENCOUNTER — Telehealth: Payer: Self-pay | Admitting: Diagnostic Neuroimaging

## 2022-01-26 ENCOUNTER — Encounter: Payer: Self-pay | Admitting: Diagnostic Neuroimaging

## 2022-01-26 NOTE — Telephone Encounter (Signed)
medicare/mutual of [place] NPR sent to [facility]

## 2022-01-27 DIAGNOSIS — J3081 Allergic rhinitis due to animal (cat) (dog) hair and dander: Secondary | ICD-10-CM | POA: Diagnosis not present

## 2022-01-27 DIAGNOSIS — J301 Allergic rhinitis due to pollen: Secondary | ICD-10-CM | POA: Diagnosis not present

## 2022-01-27 DIAGNOSIS — J3089 Other allergic rhinitis: Secondary | ICD-10-CM | POA: Diagnosis not present

## 2022-02-01 DIAGNOSIS — J3089 Other allergic rhinitis: Secondary | ICD-10-CM | POA: Diagnosis not present

## 2022-02-01 DIAGNOSIS — J301 Allergic rhinitis due to pollen: Secondary | ICD-10-CM | POA: Diagnosis not present

## 2022-02-01 DIAGNOSIS — H1045 Other chronic allergic conjunctivitis: Secondary | ICD-10-CM | POA: Diagnosis not present

## 2022-02-01 DIAGNOSIS — J4530 Mild persistent asthma, uncomplicated: Secondary | ICD-10-CM | POA: Diagnosis not present

## 2022-02-01 DIAGNOSIS — J3081 Allergic rhinitis due to animal (cat) (dog) hair and dander: Secondary | ICD-10-CM | POA: Diagnosis not present

## 2022-02-07 ENCOUNTER — Ambulatory Visit
Admission: RE | Admit: 2022-02-07 | Discharge: 2022-02-07 | Disposition: A | Discharge status: Home / Self Care | Payer: Medicare Other | Source: Ambulatory Visit | Attending: Diagnostic Neuroimaging | Admitting: Diagnostic Neuroimaging

## 2022-02-07 DIAGNOSIS — R413 Other amnesia: Secondary | ICD-10-CM | POA: Diagnosis not present

## 2022-02-07 MED ORDER — GADOBENATE DIMEGLUMINE 529 MG/ML IV SOLN
12.0000 mL | Freq: Once | INTRAVENOUS | Status: AC | PRN
  Administered 2022-02-07: 12 mL via INTRAVENOUS

## 2022-02-16 DIAGNOSIS — J3081 Allergic rhinitis due to animal (cat) (dog) hair and dander: Secondary | ICD-10-CM | POA: Diagnosis not present

## 2022-02-16 DIAGNOSIS — J3089 Other allergic rhinitis: Secondary | ICD-10-CM | POA: Diagnosis not present

## 2022-02-16 DIAGNOSIS — J301 Allergic rhinitis due to pollen: Secondary | ICD-10-CM | POA: Diagnosis not present

## 2022-03-09 DIAGNOSIS — Z23 Encounter for immunization: Secondary | ICD-10-CM | POA: Diagnosis not present

## 2022-03-15 ENCOUNTER — Telehealth: Payer: Self-pay

## 2022-03-15 NOTE — Telephone Encounter (Signed)
Renold Don missed a call from you and would you to call her back in regards to patient [phone]

## 2022-03-16 DIAGNOSIS — J3089 Other allergic rhinitis: Secondary | ICD-10-CM | POA: Diagnosis not present

## 2022-03-16 DIAGNOSIS — J3081 Allergic rhinitis due to animal (cat) (dog) hair and dander: Secondary | ICD-10-CM | POA: Diagnosis not present

## 2022-03-16 DIAGNOSIS — J301 Allergic rhinitis due to pollen: Secondary | ICD-10-CM | POA: Diagnosis not present

## 2022-03-30 DIAGNOSIS — J3081 Allergic rhinitis due to animal (cat) (dog) hair and dander: Secondary | ICD-10-CM | POA: Diagnosis not present

## 2022-03-30 DIAGNOSIS — J301 Allergic rhinitis due to pollen: Secondary | ICD-10-CM | POA: Diagnosis not present

## 2022-03-30 DIAGNOSIS — J3089 Other allergic rhinitis: Secondary | ICD-10-CM | POA: Diagnosis not present

## 2022-04-08 DIAGNOSIS — Z23 Encounter for immunization: Secondary | ICD-10-CM | POA: Diagnosis not present

## 2022-04-13 DIAGNOSIS — J3081 Allergic rhinitis due to animal (cat) (dog) hair and dander: Secondary | ICD-10-CM | POA: Diagnosis not present

## 2022-04-13 DIAGNOSIS — J301 Allergic rhinitis due to pollen: Secondary | ICD-10-CM | POA: Diagnosis not present

## 2022-04-13 DIAGNOSIS — J3089 Other allergic rhinitis: Secondary | ICD-10-CM | POA: Diagnosis not present

## 2022-04-25 ENCOUNTER — Other Ambulatory Visit: Payer: Self-pay

## 2022-04-25 MED ORDER — SYNTHROID 50 MCG PO TABS: ORAL_TABLET | ORAL | 1 refills | Status: DC

## 2022-05-04 DIAGNOSIS — J3081 Allergic rhinitis due to animal (cat) (dog) hair and dander: Secondary | ICD-10-CM | POA: Diagnosis not present

## 2022-05-04 DIAGNOSIS — J301 Allergic rhinitis due to pollen: Secondary | ICD-10-CM | POA: Diagnosis not present

## 2022-05-04 DIAGNOSIS — J3089 Other allergic rhinitis: Secondary | ICD-10-CM | POA: Diagnosis not present

## 2022-05-12 ENCOUNTER — Other Ambulatory Visit: Payer: Medicare Other

## 2022-05-12 DIAGNOSIS — E039 Hypothyroidism, unspecified: Secondary | ICD-10-CM

## 2022-05-12 DIAGNOSIS — R413 Other amnesia: Secondary | ICD-10-CM

## 2022-05-12 DIAGNOSIS — Z13220 Encounter for screening for lipoid disorders: Secondary | ICD-10-CM

## 2022-05-12 DIAGNOSIS — Z136 Encounter for screening for cardiovascular disorders: Secondary | ICD-10-CM

## 2022-05-13 LAB — CBC WITH DIFFERENTIAL/PLATELET
Absolute Monocytes: 313 cells/uL (ref 200–950)
Basophils Absolute: 60 cells/uL (ref 0–200)
Basophils Relative: 1.3 %
Eosinophils Absolute: 281 cells/uL (ref 15–500)
Eosinophils Relative: 6.1 %
HCT: 38.8 % (ref 35.0–45.0)
Hemoglobin: 13.4 g/dL (ref 11.7–15.5)
Lymphs Abs: 846 cells/uL — ABNORMAL LOW (ref 850–3900)
MCH: 31.2 pg (ref 27.0–33.0)
MCHC: 34.5 g/dL (ref 32.0–36.0)
MCV: 90.4 fL (ref 80.0–100.0)
MPV: 9.3 fL (ref 7.5–12.5)
Monocytes Relative: 6.8 %
Neutro Abs: 3100 cells/uL (ref 1500–7800)
Neutrophils Relative %: 67.4 %
Platelets: 326 10*3/uL (ref 140–400)
RBC: 4.29 10*6/uL (ref 3.80–5.10)
RDW: 12.2 % (ref 11.0–15.0)
Total Lymphocyte: 18.4 %
WBC: 4.6 10*3/uL (ref 3.8–10.8)

## 2022-05-13 LAB — COMPLETE METABOLIC PANEL WITH GFR
AG Ratio: 2.3 (calc) (ref 1.0–2.5)
ALT: 23 U/L (ref 6–29)
AST: 30 U/L (ref 10–35)
Albumin: 4.3 g/dL (ref 3.6–5.1)
Alkaline phosphatase (APISO): 49 U/L (ref 37–153)
BUN/Creatinine Ratio: 14 (calc) (ref 6–22)
BUN: 15 mg/dL (ref 7–25)
CO2: 26 mmol/L (ref 20–32)
Calcium: 9.1 mg/dL (ref 8.6–10.4)
Chloride: 106 mmol/L (ref 98–110)
Creat: 1.05 mg/dL — ABNORMAL HIGH (ref 0.60–1.00)
Globulin: 1.9 g/dL (calc) (ref 1.9–3.7)
Glucose, Bld: 101 mg/dL — ABNORMAL HIGH (ref 65–99)
Potassium: 5 mmol/L (ref 3.5–5.3)
Sodium: 140 mmol/L (ref 135–146)
Total Bilirubin: 0.6 mg/dL (ref 0.2–1.2)
Total Protein: 6.2 g/dL (ref 6.1–8.1)
eGFR: 56 mL/min/{1.73_m2} — ABNORMAL LOW (ref >=60)

## 2022-05-13 LAB — LIPID PANEL
Cholesterol: 199 mg/dL (ref <200)
HDL: 87 mg/dL (ref >=50)
LDL Cholesterol (Calc): 97 mg/dL (calc)
Non-HDL Cholesterol (Calc): 112 mg/dL (calc) (ref <130)
Total CHOL/HDL Ratio: 2.3 (calc) (ref <5.0)
Triglycerides: 65 mg/dL (ref <150)

## 2022-05-13 LAB — TSH: TSH: 6.66 mIU/L — ABNORMAL HIGH (ref 0.40–4.50)

## 2022-05-19 ENCOUNTER — Encounter: Payer: Self-pay | Admitting: Internal Medicine

## 2022-05-19 ENCOUNTER — Ambulatory Visit (INDEPENDENT_AMBULATORY_CARE_PROVIDER_SITE_OTHER): Payer: Medicare Other | Admitting: Internal Medicine

## 2022-05-19 VITALS — BP 120/80 | HR 66 | Temp 98.2°F | Ht 66.5 in | Wt 141.0 lb

## 2022-05-19 DIAGNOSIS — R6889 Other general symptoms and signs: Secondary | ICD-10-CM | POA: Diagnosis not present

## 2022-05-19 DIAGNOSIS — Z789 Other specified health status: Secondary | ICD-10-CM

## 2022-05-19 DIAGNOSIS — J301 Allergic rhinitis due to pollen: Secondary | ICD-10-CM | POA: Diagnosis not present

## 2022-05-19 DIAGNOSIS — R413 Other amnesia: Secondary | ICD-10-CM | POA: Diagnosis not present

## 2022-05-19 DIAGNOSIS — Z0000 Encounter for general adult medical examination without abnormal findings: Secondary | ICD-10-CM | POA: Diagnosis not present

## 2022-05-19 DIAGNOSIS — Z8719 Personal history of other diseases of the digestive system: Secondary | ICD-10-CM | POA: Diagnosis not present

## 2022-05-19 DIAGNOSIS — J3081 Allergic rhinitis due to animal (cat) (dog) hair and dander: Secondary | ICD-10-CM | POA: Diagnosis not present

## 2022-05-19 MED ORDER — LEVOTHYROXINE SODIUM 75 MCG PO TABS: 75.0000 ug | ORAL_TABLET | Freq: Every day | ORAL | 0 refills | Status: DC

## 2022-05-19 NOTE — Progress Notes (Signed)
Annual Wellness Visit     Patient: Chelsea Cantrell, Female    DOB: 1945-10-26, 76 y.o.   MRN: [id number] Visit Date: 05/19/2022  Chief Complaint  Patient presents with   Annual Exam   Subjective    Chelsea Cantrell is a 76 y.o. female who presents today for her Annual Wellness Visit.  HPI She also presents for health maintenance exam and evaluation of medical issues.  Friends have been concerned about her memory.  She saw Dr. Leta Cantrell for memory loss September 2023.  MRI of the brain showed mild scattered chronic small vessel ischemic disease but no acute findings.  She has been checked for B12 and folate deficiency.  History of hypothyroidism.  TSH and free T4 have been normal.  She resides alone.  She has allergic rhinitis issues and has seen Dr. Fredderick Cantrell.  History of mild asthma.  She is allergic to cats and dogs.  History of recurrent pancreatitis in the remote past but has not had an episode in several years.  Had recurrent episodes in 2016.  A stent was placed in her bile duct/pancreatic duct at [facility] in June 2016 and she had a sphincterotomy.  The stent was subsequently removed by Dr. Earlean Cantrell, gastroenterologist and she has had no further issues.  History of GE reflux, exercise-induced asthma, Schatzki's ring, hyperplastic colon polyps.  She has taken estrogen replacement.  She was tried on Strattera in 2003 for attention deficit disorder but currently does not take any medicines for attention deficit.  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  She had another epidural steroid injection in January 2019 but did not feel that it helped.  The pain returned after 3 weeks and she did not repeat the injection.  Currently she is not complaining of back pain.  Social history: Single, never married.  No children.  Retired as an Programme researcher, broadcasting/film/video for [employer].  Did smoke prior to 1976  but currently a non-smoker.  She drinks wine socially.  She exercises.  Seems to have exercised more in the past but not so much recently.  She is a vegetarian.  Family history: Father died at age 21 of lung cancer with history of MI at age 15.  Father also had hyperlipidemia.  Mother died at age 39 but had a stroke 4 years before she passed away.  1 brother and 1 sister in good health.      Patient Care Team: Elby Showers, MD as PCP - General (Internal Medicine)  Review of Systems has no other complaints   Objective    Vitals: Blood pressure 120/80, pulse 66, temperature 98.2 degrees, pulse oximetry 99% weight 141 pounds height 5 feet 6.5 inches BMI 22.42  Physical Exam  Skin: Warm and dry.  No cervical adenopathy.  No thyromegaly.  No carotid bruits.  Chest clear.  Cardiac exam: Regular rate and rhythm.  Abdomen is soft nondistended without hepatosplenomegaly masses or tenderness.  No lower extremity pitting edema.  Cranial nerves II through XII are grossly intact.  Her affect is normal.  She is pleasant and cooperative.  No gross focal deficits appreciated.     Most recent fall risk assessment:    05/19/2022   11:03 AM  [place] in the past year? 1  Number falls in past yr: 0  Injury with Fall? 1  Risk for fall due to :  Other (Comment)  Follow up Falls prevention discussed    Most recent depression screenings:    05/19/2022   11:03 AM 05/13/2021   11:15 AM  PHQ 2/9 Scores  PHQ - 2 Score 0 0   Most recent cognitive screening:    05/13/2021   11:15 AM  6CIT Screen  What Year? 0 points  What month? 0 points  What time? 0 points  Count back from 20 0 points  Months in reverse 0 points  Repeat phrase 2 points  Total Score 2 points       Assessment & Plan  A couple of her friends have called here concerned about her driving and memory loss.  Apparently she has gotten lost driving a time or 2.  I have been in contact with her sister regarding this.   Her sister is aware that I am pursuing a memory workup.  She is agreeable to this.  Referral to Dr. Sima Cantrell for memory evaluation.  This is scheduled for February 8,2024.  Suggest that we try to follow-up with her in 6 months.  Thyroid medication refilled.  Vaccines reviewed.     Annual wellness visit done today including the all of the following: Reviewed patient's Family Medical History Reviewed and updated list of patient's medical providers Assessment of cognitive impairment was done Assessed patient's functional ability Established a written schedule for health screening [place] Completed and Reviewed  Discussed health benefits of physical activity, and encouraged her to engage in regular exercise appropriate for her age and condition.         {I, Elby Showers, MD, have reviewed all documentation for this visit. The documentation on 05/19/22 for the exam, diagnosis, procedures, and orders are all accurate and complete. Chelsea Cantrell, CMA

## 2022-05-23 DIAGNOSIS — F0390 Unspecified dementia without behavioral disturbance: Secondary | ICD-10-CM

## 2022-05-23 HISTORY — DX: Unspecified dementia, unspecified severity, without behavioral disturbance, psychotic disturbance, mood disturbance, and anxiety: F03.90

## 2022-05-25 DIAGNOSIS — J3089 Other allergic rhinitis: Secondary | ICD-10-CM | POA: Diagnosis not present

## 2022-05-25 DIAGNOSIS — J3081 Allergic rhinitis due to animal (cat) (dog) hair and dander: Secondary | ICD-10-CM | POA: Diagnosis not present

## 2022-05-25 DIAGNOSIS — J301 Allergic rhinitis due to pollen: Secondary | ICD-10-CM | POA: Diagnosis not present

## 2022-06-18 NOTE — Patient Instructions (Addendum)
Discussed with patient memory evaluation.  I think this is worthwhile at this point in time and explained my concerns to her today.  Her thyroid functions appear to be normal.  She has had a neurology evaluation.  She has appointment to see Dr. Sima Matas June 30, 2022.  Levothyroxine refilled.  We can follow-up with her in 6 months.

## 2022-06-22 DIAGNOSIS — J301 Allergic rhinitis due to pollen: Secondary | ICD-10-CM | POA: Diagnosis not present

## 2022-06-22 DIAGNOSIS — J3089 Other allergic rhinitis: Secondary | ICD-10-CM | POA: Diagnosis not present

## 2022-06-22 DIAGNOSIS — J3081 Allergic rhinitis due to animal (cat) (dog) hair and dander: Secondary | ICD-10-CM | POA: Diagnosis not present

## 2022-06-30 ENCOUNTER — Encounter: Payer: Self-pay | Admitting: Psychology

## 2022-06-30 ENCOUNTER — Encounter: Payer: Medicare Other | Attending: Psychology | Admitting: Psychology

## 2022-06-30 DIAGNOSIS — G3184 Mild cognitive impairment, so stated: Secondary | ICD-10-CM | POA: Insufficient documentation

## 2022-06-30 DIAGNOSIS — J3081 Allergic rhinitis due to animal (cat) (dog) hair and dander: Secondary | ICD-10-CM | POA: Diagnosis not present

## 2022-06-30 DIAGNOSIS — J3089 Other allergic rhinitis: Secondary | ICD-10-CM | POA: Diagnosis not present

## 2022-06-30 DIAGNOSIS — J301 Allergic rhinitis due to pollen: Secondary | ICD-10-CM | POA: Diagnosis not present

## 2022-06-30 NOTE — Progress Notes (Signed)
Neuropsychological Consultation   Patient:   Chelsea Cantrell  8 DOB:   [birth year]  MR Number:  [id number] With respect to Location:  [address], [place] [id number] [place] [id number] Dept: [phone]           Date of Service:   06/30/2022  Start Time:   8 AM End Time:   10 AM  Today's visit was an in person appointment with the patient and myself present conducted in my outpatient office.  1 hour and 15 minutes was spent in face to face clinical interview and 45 minutes for records review, report preporation and setting up setting protocols.  Provider/Observer:  Ilean Skill, Psy.D.       Clinical Neuropsychologist       Billing Code/Service: [phone]  Reason for Service:    Chelsea Cantrell is a 77 year old female referred for neuropsychological evaluation by her primary care provider Tedra Senegal, MD due to concerns verbalized by the patient's friend as well as the patient describing issues around memory difficulties, geographic disorientation and some word finding difficulties.  The symptoms are described to potentially developing over the past 3 to 4 years.  The patient has also been seen by Andrey Spearman, MD for for neurological consultation due to these memory issues and memory loss.  The patient has a past medical history that includes a history of asthma, GI difficulties including irritable bowel syndrome, pancreatitis and previous diagnosis of seasonal affective disorder.  The patient has acknowledged times of worry but no significant history of anxiety disorder.  The patient is taking no current psychotropic medications.  The patient was seen by her neurologist in September of last year with diagnoses of memory loss associated with possible mild cognitive impairment versus normal age-related condition versus attentional issues and mild anxiety.  The  patient's visit with Dr. Renold Genta have included discussions of concerns from the patient's sister and a friend around the patient driving and memory loss.  The driving is concerning issues of the patient getting lost while driving on a limited basis.  The patient reports that she does rely on GPS to navigate particular places that she is not particularly familiar with.  The patient acknowledges having a limited number of times where she knew where she was going and was a destination that she was relatively familiar with but got turned around and had difficulty navigating without GPS.  The patient had a recent MRI/brain conducted on 02/07/2022 that found no acute process and brain structures appeared to be of appropriate/normal size.  There was some scattered periventricular and subcortical foci of chronic small vessel ischemic disease that was described as mild and no other acute findings.  Pertinent lab work previously conducted to include B12 and folate acid studies both of which were within normal limits.  The patient does have a good diet and is a vegan and maintains good dietary habits.  During the clinical interview today, the patient reports that she has had some issues with memory particularly retrieving recently learned information.  Patient uses memory aids including writing information down and maintaining a day planner and schedule/calendar.  The patient describes long-term memory to be good with no change.  Patient describes more issues with semantic type new learning and remembering things that are told to her and information types of events rather than primary episodic memory deficits.  The patient reports that when she does  have difficulty remembering something that when she notes it on her day planner that she will often remember this with cueing.  The patient reports that when she has difficulty finding the words that she wants to say that it will usually come back later or if someone predicts  the word for her that she will know it is right or not.  The patient denies significant circumlocution or paraphasic errors with speech.  The patient reports that she is not dealing with any significant current medical issues that could explain her difficulties.  Only prior hospitalization was for right hip replacement surgery.  The patient denies any history of concussive events with loss of consciousness or altered consciousness although she has had some falls.  The patient reports that she gets regular exercise including extended time walking her dog each day and the patient goes to Pilates and exercise group type classes on a regular basis.  The patient describes good sleep habits and denies any symptoms consistent with obstructive sleep apnea although I did suggest she may want to get a recordable pulse ox meter/blood oxygen level meter that she could indirectly assess for significant episodes of dropped oxygen levels during sleep.  The patient takes melatonin at night to aid with sleep but reports that she typically falls asleep fine.  Patient goes to sleep around 8 PM at night and with the exception of rare events will fall asleep and typically stay asleep throughout the night.  The patient gets up around 5 AM and reports that she feels rested in the AM.  Patient describes appetite is good and that she is a vegan with good variation in her diet.  The patient denies any tremors, visual hallucinations or auditory hallucinations and is not aware of any other cognitive domains besides memory and expressive language functions and geographic disorientation being present.  Behavioral Observation: Chelsea Cantrell  presents as a 77 y.o.-year-old Right handed Caucasian Female who appeared her stated age. her dress was Appropriate and she was Well Groomed and her manners were Appropriate to the situation.  her participation was indicative of Appropriate and Attentive behaviors.  There were not physical  disabilities noted.  she displayed an appropriate level of cooperation and motivation.    Interactions:    Active Appropriate  Attention:   within normal limits and attention span and concentration were age appropriate  Memory:   within normal limits; recent and remote memory intact  Visuo-spatial:  not examined  Speech (Volume):  normal  Speech:   normal; normal  Thought Process:  Coherent and Relevant  Though Content:  WNL; not suicidal and not homicidal  Orientation:   person, place, time/date, and situation  Judgment:   Good  Planning:   Fair  Affect:    Appropriate  Mood:    Anxious  Insight:   Good  Intelligence:   high  Marital Status/Living: The patient was born and [place] and grew up in [place].  The patient has lived in [place] for the past 30 years.  She has 2 siblings.  Pregnancy/delivery in early childhood development for the patient were all noted as normal and she only dealt with normal childhood illnesses including chickenpox.  The patient did have an episode of pneumonia as a child.  The patient is single and has never been married and has no children.  She currently lives by herself.  Patient reports that she is managing IADLs and ADLs without issue.  Current Employment:  The patient is retired.  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  Hobbies and interest include regular exercise.  Substance Use:  No concerns of substance abuse are reported.  Patient has 1 glass of wine each night and some periodic social use.  Education:   Patient graduated from [employer] with undergraduate degree.  Medical History:   Past Medical History:  Diagnosis Date   Allergy    Arthritis    Asthma    exercise induced   Colon polyps    Constipation    IBS (irritable bowel syndrome)    Osteoporosis 03/2015   T score -2.6 left  forearm. 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   Elevated LFTs 10/06/2014   Acute pancreatitis 09/20/2014   Pancreatitis 06/09/2014   Abdominal pain 06/09/2014   Transaminitis 06/09/2014   UTI (urinary tract infection) 06/09/2014   Epigastric pain    Pain in limb 12/27/2012   Varicose veins of lower extremities with other complications 54/01/8118   IBS (irritable bowel syndrome)    Hyperplastic colon polyp 03/12/2012   GE reflux 03/27/2011   Osteoarthritis 03/27/2011   Exercise-induced asthma 03/27/2011   Allergic rhinitis 03/27/2011   Hypothyroidism 03/27/2011   Spinal stenosis 03/27/2011   Seasonal affective disorder ([place]) 03/27/2011        Abuse/Trauma History: No history of abuse or trauma noted.  Psychiatric History:  Patient has described some worry and anxiety "tendencies" but no past psychiatric history.  Family Med/Psych History:  Family History  Problem Relation Age of Onset   Stroke Mother    Osteoporosis Mother    Heart disease Father    Hyperlipidemia Father    Lung cancer Father     Risk of Suicide/Violence: virtually non-existent no concerns around risk of suicidal or homicidal ideation.  Impression/DX:  Chelsea Cantrell is a 77 year old female referred for neuropsychological evaluation by her primary care provider Tedra Senegal, MD due to concerns verbalized by the  patient's friend as well as the patient describing issues around memory difficulties, geographic disorientation and some word finding difficulties.  The symptoms are described to potentially developing over the past 3 to 4 years.  The patient has also been seen by Andrey Spearman, MD for for neurological consultation due to these memory issues and memory loss.  The patient has a past medical history that includes a history of asthma, GI difficulties including irritable bowel syndrome, pancreatitis and previous diagnosis of seasonal affective disorder.  The patient has acknowledged times of worry but no significant history of anxiety disorder.  The patient is taking no current psychotropic medications.  Disposition/Plan:  We have set the patient up for formal neuropsychological assessment and will start with a foundational battery including the  Wechsler Adult Intelligence Scale's and the Wechsler Memory Scale's as well as the controlled oral Word Association test.  There were no indications of any vascular events and no suggestion of lateralization of her difficulties.  Once this is completed a formal report will be produced and made available to her referring physician Cresenciano Lick. Baxley, MD and also will be made available to her treating neurologist.  I will also sit down with the patient and provide face-to-face feedback with in-depth recommendations that would be highlighted through this neuropsychological evaluation.  Diagnosis:    Mild cognitive impairment with memory loss         Electronically Signed   _______________________ Ilean Skill, Psy.D. Clinical Neuropsychologist

## 2022-06-30 NOTE — Progress Notes (Signed)
Patient Care Team: Elby Showers, MD as PCP - General (Internal Medicine)  Visit Date: 07/05/22  Subjective:    Patient ID: Chelsea Cantrell , Female   DOB: Sep 22, 1945, 77 y.o.    MRN: [id number]   76 y.o. Female presents today for 6 week thyroid follow-up and back pain. Patient has a past medical history of arthritis, asthma, colon polyps, constipation, IBS, osteoporosis, pancreatitis, Schatzki's ring, spinal stenosis, thyroid disease, seasonal affective disorder.  History of thyroid disease treated with Synthroid 75 mcg daily before breakfast. Reports she may have forgotten a few doses recently and has not started taking the 75 mcg dosage yet. TSH elevated at 6.66 1 month ago, lowered to 2.34 on 07/04/22.   History of mild cognitive impairment with memory loss. Followed by neurologist, Ilean Skill. Will be scheduled for neuropsychological assessments including Wechsler Adult Intelligence Scale's, Wechsler Memory Scale's, Word Association, with a follow-up to review results on 03/27/23.  Reports increased back pain recently. Denies injury and has not been exercising much lately   Past Medical History:  Diagnosis Date   Allergy    Arthritis    Asthma    exercise induced   Colon polyps    Constipation    IBS (irritable bowel syndrome)    Osteoporosis 03/2015   T score -2.6 left forearm. Left femoral neck -1.4, AP spine 0.1   Pancreatitis    Schatzki's ring    Seasonal affective disorder (HCC)    Spinal stenosis    Thyroid disease    Hypo     Family History  Problem Relation Age of Onset   Stroke Mother    Osteoporosis Mother    Heart disease Father    Hyperlipidemia Father    Lung cancer Father     Social History   Social History Narrative   Social history: Single, never married.  No children.  Retired as an Programme researcher, broadcasting/film/video for [employer].  Did smoke prior to 1976 but currently non-smoker.  She is a vegetarian.  She drinks wine.  Exercises routinely.        Family history: Father died at age 77 of lung cancer with history of MI at age 67.  Father also has hyperlipidemia.  Mother died at age 94 but had a stroke 4 years before she passed away.  1 brother and 1 sister in good health.          Review of Systems  Constitutional:  Negative for fever and malaise/fatigue.  HENT:  Negative for congestion.   Eyes:  Negative for blurred vision.  Respiratory:  Negative for cough and shortness of breath.   Cardiovascular:  Negative for chest pain, palpitations and leg swelling.  Gastrointestinal:  Negative for vomiting.  Musculoskeletal:  Positive for back pain.  Skin:  Negative for rash.  Neurological:  Negative for loss of consciousness and headaches.        Objective:   Vitals: BP 110/70   Pulse 75   Temp 98.8 F (37.1 C) (Tympanic)   Ht 5' 6.5" (1.689 m)   Wt 140 lb (63.5 kg)   SpO2 98%   BMI 22.26 kg/m    Physical Exam Vitals and nursing note reviewed.  Constitutional:      General: She is not in acute distress.    Appearance: Normal appearance. She is not toxic-appearing.  HENT:     Head: Normocephalic and atraumatic.  Pulmonary:     Effort: Pulmonary effort is normal.  Musculoskeletal:  Comments: Positive straight leg raise on the right. 5/5 muscle strength in all groups tested in LE bilaterally. Deep tendon reflexes in knees are normal bilaterally. Deep tendon reflexes 1+ in ankles bilaterally.  Skin:    General: Skin is warm and dry.  Neurological:     Mental Status: She is alert and oriented to person, place, and time. Mental status is at baseline.  Psychiatric:        Mood and Affect: Mood normal.        Behavior: Behavior normal.        Thought Content: Thought content normal.        Judgment: Judgment normal.     Straight leg raising is negative at 90 degrees bilaterally.  Range of motion in the trunk is fairly good and muscle strength is normal in the lower extremities bilaterally.  Results:   Studies  obtained and personally reviewed by me:    Labs:       Component Value Date/Time   NA 140 05/12/2022 1018   K 5.0 05/12/2022 1018   CL 106 05/12/2022 1018   CO2 26 05/12/2022 1018   GLUCOSE 101 (H) 05/12/2022 1018   BUN 15 05/12/2022 1018   CREATININE 1.05 (H) 05/12/2022 1018   CALCIUM 9.1 05/12/2022 1018   PROT 6.2 05/12/2022 1018   ALBUMIN 4.2 04/25/2016 1000   AST 30 05/12/2022 1018   ALT 23 05/12/2022 1018   ALKPHOS 56 04/25/2016 1000   BILITOT 0.6 05/12/2022 1018   GFRNONAA 63 05/07/2020 0930   GFRAA 73 05/07/2020 0930     Lab Results  Component Value Date   WBC 4.6 05/12/2022   HGB 13.4 05/12/2022   HCT 38.8 05/12/2022   MCV 90.4 05/12/2022   PLT 326 05/12/2022    Lab Results  Component Value Date   CHOL 199 05/12/2022   HDL 87 05/12/2022   LDLCALC 97 05/12/2022   TRIG 65 05/12/2022   CHOLHDL 2.3 05/12/2022    No results found for: "HGBA1C"   Lab Results  Component Value Date   TSH 2.34 07/04/2022      Assessment & Plan:   Hypothyroidism: Start taking Synthroid 75 mcg daily before breakfast.I suspect TSH was elevated with last visit because she had missed some doses perhaps due to forgetfulness. She has been taking it more regularly and TSH improved from 6.66 to 2.34 without increasing the dose. However I want the TSH around 1.00 so she will start taking higher dose right away.  Back Pain: (Low back strain) prescribed Mobic 15 mg daily for one month.May need PT if not improving  Memory loss:-To have neuropsychological testing with Dr. Sima Matas in October with follow-up in November  Plan: Return in 6 weeks to retest TSH without OV on higher dose of thyroid replacement medication i.e. 75 mcg daily. Has appt for testing with Dr. Sima Matas in October with results review in November.  I would like to see her TSH around 1.00 which I believe would be optimal.    I,Alexander Ruley,acting as a scribe for Elby Showers, MD.,have documented all relevant  documentation on the behalf of Elby Showers, MD,as directed by  Elby Showers, MD while in the presence of Elby Showers, MD.

## 2022-07-04 ENCOUNTER — Other Ambulatory Visit (INDEPENDENT_AMBULATORY_CARE_PROVIDER_SITE_OTHER): Payer: Medicare Other

## 2022-07-04 DIAGNOSIS — Z87440 Personal history of urinary (tract) infections: Secondary | ICD-10-CM

## 2022-07-04 DIAGNOSIS — E039 Hypothyroidism, unspecified: Secondary | ICD-10-CM | POA: Diagnosis not present

## 2022-07-04 LAB — POCT URINALYSIS DIPSTICK
Bilirubin, UA: NEGATIVE
Blood, UA: NEGATIVE
Glucose, UA: NEGATIVE
Ketones, UA: NEGATIVE
Leukocytes, UA: NEGATIVE
Nitrite, UA: NEGATIVE
Protein, UA: NEGATIVE
Spec Grav, UA: <=1.005 (ref 1.010–1.025)
Urobilinogen, UA: 0.2 E.U./dL
pH, UA: 8 (ref 5.0–8.0)

## 2022-07-04 NOTE — Addendum Note (Signed)
Addended by: Geradine Girt D on: 07/04/2022 10:52 AM   Modules accepted: Orders

## 2022-07-05 ENCOUNTER — Ambulatory Visit (INDEPENDENT_AMBULATORY_CARE_PROVIDER_SITE_OTHER): Payer: Medicare Other | Admitting: Internal Medicine

## 2022-07-05 ENCOUNTER — Encounter: Payer: Self-pay | Admitting: Internal Medicine

## 2022-07-05 VITALS — BP 110/70 | HR 75 | Temp 98.8°F | Ht 66.5 in | Wt 140.0 lb

## 2022-07-05 DIAGNOSIS — M5441 Lumbago with sciatica, right side: Secondary | ICD-10-CM

## 2022-07-05 DIAGNOSIS — R413 Other amnesia: Secondary | ICD-10-CM | POA: Diagnosis not present

## 2022-07-05 DIAGNOSIS — R6889 Other general symptoms and signs: Secondary | ICD-10-CM

## 2022-07-05 LAB — T4, FREE: Free T4: 1.2 ng/dL (ref 0.8–1.8)

## 2022-07-05 LAB — TSH: TSH: 2.34 mIU/L (ref 0.40–4.50)

## 2022-07-05 MED ORDER — MELOXICAM 15 MG PO TABS: 15.0000 mg | ORAL_TABLET | Freq: Every day | ORAL | 2 refills | Status: DC

## 2022-07-05 NOTE — Patient Instructions (Addendum)
TSH has improved but has not started new dose of thyroid replacement. Start new dose of thyroid replacement 75 mcg daily and RTC in 6 weeks for TSH only. Has seen Dr. Sima Matas and has follow up testing scheduled.Start meloicam for low back pain daily with food. If not improving refer to PT.

## 2022-07-06 DIAGNOSIS — J3089 Other allergic rhinitis: Secondary | ICD-10-CM | POA: Diagnosis not present

## 2022-07-06 DIAGNOSIS — J301 Allergic rhinitis due to pollen: Secondary | ICD-10-CM | POA: Diagnosis not present

## 2022-07-06 DIAGNOSIS — J3081 Allergic rhinitis due to animal (cat) (dog) hair and dander: Secondary | ICD-10-CM | POA: Diagnosis not present

## 2022-07-20 DIAGNOSIS — H43813 Vitreous degeneration, bilateral: Secondary | ICD-10-CM | POA: Diagnosis not present

## 2022-07-20 DIAGNOSIS — H0012 Chalazion right lower eyelid: Secondary | ICD-10-CM | POA: Diagnosis not present

## 2022-07-20 DIAGNOSIS — H04123 Dry eye syndrome of bilateral lacrimal glands: Secondary | ICD-10-CM | POA: Diagnosis not present

## 2022-07-20 DIAGNOSIS — H401131 Primary open-angle glaucoma, bilateral, mild stage: Secondary | ICD-10-CM | POA: Diagnosis not present

## 2022-07-20 DIAGNOSIS — H1045 Other chronic allergic conjunctivitis: Secondary | ICD-10-CM | POA: Diagnosis not present

## 2022-07-20 DIAGNOSIS — H2513 Age-related nuclear cataract, bilateral: Secondary | ICD-10-CM | POA: Diagnosis not present

## 2022-08-05 DIAGNOSIS — J3081 Allergic rhinitis due to animal (cat) (dog) hair and dander: Secondary | ICD-10-CM | POA: Diagnosis not present

## 2022-08-05 DIAGNOSIS — J301 Allergic rhinitis due to pollen: Secondary | ICD-10-CM | POA: Diagnosis not present

## 2022-08-05 DIAGNOSIS — J3089 Other allergic rhinitis: Secondary | ICD-10-CM | POA: Diagnosis not present

## 2022-08-09 ENCOUNTER — Encounter: Payer: Medicare Other | Attending: Psychology

## 2022-08-09 DIAGNOSIS — G3184 Mild cognitive impairment, so stated: Secondary | ICD-10-CM | POA: Insufficient documentation

## 2022-08-09 NOTE — Progress Notes (Signed)
Behavioral Observations:  The patient appeared well-groomed and appropriately dressed. Her manners were polite and appropriate to the situation. The patient's attitude towards testing was positive and her effort was good.   Neuropsychology Note  Shawnte Rogel Moman completed 95 minutes of neuropsychological testing with technician, Wandra Scot, BA, under the supervision of Ilean Skill, PsyD., Clinical Neuropsychologist. The patient did not appear overtly distressed by the testing session, per behavioral observation or via self-report to the technician. Rest breaks were offered.   Clinical Decision Making: In considering the patient's current level of functioning, level of presumed impairment, nature of symptoms, emotional and behavioral responses during clinical interview, level of literacy, and observed level of motivation/effort, a battery of tests was selected by Dr. Sima Matas during initial consultation on 06/30/2022. This was communicated to the technician. Communication between the neuropsychologist and technician was ongoing throughout the testing session and changes were made as deemed necessary based on patient performance on testing, technician observations and additional pertinent factors such as those listed above.  Tests Administered: Controlled Oral Word Association Test (COWAT; FAS & Animals)  Wechsler Adult Intelligence Scale, 4th Edition (WAIS-IV) Wechsler Memory Scale, 4th Edition (WMS-IV); Older Adult Battery   Results:  COWAT:  FAS total=16 Z= -1.47 Animals total= 11 Z= -1.28  WAIS-IV: Composite Score Summary  Scale Sum of Scaled Scores Composite Score Percentile Rank 95% Conf. Interval Qualitative Description  Verbal Comprehension 25 VCI 91 27 86-97 Average  Perceptual Reasoning 26 PRI 92 30 86-99 Average  Working Memory 17 WMI 92 30 86-99 Average  Full Scale 86 FSIQ 90 25 86-94 Average  General Ability 51 GAI 91 27 86-96 Average   Verbal  Comprehension Subtests Summary  Subtest Raw Score Scaled Score Percentile Rank Reference Group Scaled Score SEM  Similarities 15 6 9 5  0.95  Vocabulary 35 10 50 10 0.67  Information 13 9 37 10 0.73    Perceptual Reasoning Subtests Summary  Subtest Raw Score Scaled Score Percentile Rank Reference Group Scaled Score SEM  Block Design 28 9 37 6 0.99  Matrix Reasoning 11 9 37 6 0.90  Visual Puzzles 8 8 25 6  0.99    Working Doctor, general practice Raw Score Scaled Score Percentile Rank Reference Group Scaled Score SEM  Digit Span 21 8 25 6  0.73  Arithmetic 12 9 37 9 1.20    Processing Speed Subtests Summary  Subtest Raw Score Scaled Score Percentile Rank Reference Group Scaled Score SEM  Coding 45 9 37 5 1.12   WMS-IV: Index Score Summary  Index Sum of Scaled Scores Index Score Percentile Rank 95% Confidence Interval Qualitative Descriptor  Auditory Memory (AMI) 22 74 4 69-82 Borderline  Visual Memory (VMI) 9 69 2 65-75 Extremely Low  Immediate Memory (IMI) 19 77 6 72-84 Borderline  Delayed Memory (DMI) 12 63 1 58-74 Extremely Low   Primary Subtest Scaled Score Summary  Subtest Domain Raw Score Scaled Score Percentile Rank  Logical Memory I AM 14 4 2   Logical Memory II AM 3 3 1   Verbal Paired Associates I AM 11 7 16   Verbal Paired Associates II AM 4 8 25   Visual Reproduction I VM 26 8 25   Visual Reproduction II VM 0 1 0.1  Symbol Span VWM 15 9 37   Auditory Memory Process Score Summary  Process Score Raw Score Scaled Score Percentile Rank Cumulative Percentage (Base Rate)  LM II Recognition 18 - - 26-50%  VPA II Recognition 25 - - 17-25%  VPA II Word Recall 4 5 5  -    Visual Memory Process Score Summary  Process Score Raw Score Scaled Score Percentile Rank Cumulative Percentage (Base Rate)  VR II Recognition 2 - - 10-16%  VR II Copy 43 - - >75%   ABILITY-MEMORY ANALYSIS  Ability Score:  VCI: 91 Date of Testing:  WAIS-IV; WMS-IV 2022/08/09  Predicted  Difference Method   Index Predicted WMS-IV Index Score Actual WMS-IV Index Score Difference Critical Value  Significant Difference Y/N Base Rate  Auditory Memory 95 74 21 10.41 Y 5-10%  Visual Memory 96 69 27 7.35 Y 2-3%  Immediate Memory 95 77 18 9.69 Y 5-10%  Delayed Memory 95 63 32 12.22 Y <1%  Statistical significance (critical value) at the .01 level.    Feedback to Patient: Kilian Bieschke Loomis will return on 03/27/2023 for an interactive feedback session with Dr. Sima Matas at which time her test performances, clinical impressions and treatment recommendations will be reviewed in detail. The patient understands she can contact our office should she require our assistance before this time.  95 minutes spent face-to-face with patient administering standardized tests, 30 minutes spent scoring Environmental education officer). [CPT [id number], [id number]  Full report to follow.

## 2022-08-18 ENCOUNTER — Other Ambulatory Visit: Payer: Medicare Other

## 2022-08-18 DIAGNOSIS — E039 Hypothyroidism, unspecified: Secondary | ICD-10-CM

## 2022-08-18 LAB — TSH: TSH: 2.24 mIU/L (ref 0.40–4.50)

## 2022-08-22 DIAGNOSIS — J3089 Other allergic rhinitis: Secondary | ICD-10-CM | POA: Diagnosis not present

## 2022-08-22 DIAGNOSIS — J3081 Allergic rhinitis due to animal (cat) (dog) hair and dander: Secondary | ICD-10-CM | POA: Diagnosis not present

## 2022-08-22 DIAGNOSIS — J301 Allergic rhinitis due to pollen: Secondary | ICD-10-CM | POA: Diagnosis not present

## 2022-08-25 DIAGNOSIS — M5451 Vertebrogenic low back pain: Secondary | ICD-10-CM | POA: Diagnosis not present

## 2022-08-29 DIAGNOSIS — J301 Allergic rhinitis due to pollen: Secondary | ICD-10-CM | POA: Diagnosis not present

## 2022-08-29 DIAGNOSIS — J3081 Allergic rhinitis due to animal (cat) (dog) hair and dander: Secondary | ICD-10-CM | POA: Diagnosis not present

## 2022-08-29 DIAGNOSIS — J3089 Other allergic rhinitis: Secondary | ICD-10-CM | POA: Diagnosis not present

## 2022-09-05 DIAGNOSIS — J3089 Other allergic rhinitis: Secondary | ICD-10-CM | POA: Diagnosis not present

## 2022-09-05 DIAGNOSIS — J3081 Allergic rhinitis due to animal (cat) (dog) hair and dander: Secondary | ICD-10-CM | POA: Diagnosis not present

## 2022-09-05 DIAGNOSIS — J301 Allergic rhinitis due to pollen: Secondary | ICD-10-CM | POA: Diagnosis not present

## 2022-09-08 DIAGNOSIS — M5459 Other low back pain: Secondary | ICD-10-CM | POA: Diagnosis not present

## 2022-09-08 DIAGNOSIS — M5451 Vertebrogenic low back pain: Secondary | ICD-10-CM | POA: Diagnosis not present

## 2022-09-21 DIAGNOSIS — M5451 Vertebrogenic low back pain: Secondary | ICD-10-CM | POA: Diagnosis not present

## 2022-09-21 DIAGNOSIS — M5459 Other low back pain: Secondary | ICD-10-CM | POA: Diagnosis not present

## 2022-09-28 DIAGNOSIS — M5451 Vertebrogenic low back pain: Secondary | ICD-10-CM | POA: Diagnosis not present

## 2022-10-04 DIAGNOSIS — J3081 Allergic rhinitis due to animal (cat) (dog) hair and dander: Secondary | ICD-10-CM | POA: Diagnosis not present

## 2022-10-04 DIAGNOSIS — J301 Allergic rhinitis due to pollen: Secondary | ICD-10-CM | POA: Diagnosis not present

## 2022-10-04 DIAGNOSIS — J3089 Other allergic rhinitis: Secondary | ICD-10-CM | POA: Diagnosis not present

## 2022-10-10 DIAGNOSIS — J3089 Other allergic rhinitis: Secondary | ICD-10-CM | POA: Diagnosis not present

## 2022-10-10 DIAGNOSIS — J3081 Allergic rhinitis due to animal (cat) (dog) hair and dander: Secondary | ICD-10-CM | POA: Diagnosis not present

## 2022-10-10 DIAGNOSIS — J301 Allergic rhinitis due to pollen: Secondary | ICD-10-CM | POA: Diagnosis not present

## 2022-10-19 DIAGNOSIS — J3081 Allergic rhinitis due to animal (cat) (dog) hair and dander: Secondary | ICD-10-CM | POA: Diagnosis not present

## 2022-10-19 DIAGNOSIS — J3089 Other allergic rhinitis: Secondary | ICD-10-CM | POA: Diagnosis not present

## 2022-10-19 DIAGNOSIS — J301 Allergic rhinitis due to pollen: Secondary | ICD-10-CM | POA: Diagnosis not present

## 2022-10-21 DIAGNOSIS — M47816 Spondylosis without myelopathy or radiculopathy, lumbar region: Secondary | ICD-10-CM | POA: Diagnosis not present

## 2022-10-25 DIAGNOSIS — M47816 Spondylosis without myelopathy or radiculopathy, lumbar region: Secondary | ICD-10-CM | POA: Diagnosis not present

## 2022-11-14 DIAGNOSIS — M47896 Other spondylosis, lumbar region: Secondary | ICD-10-CM | POA: Diagnosis not present

## 2022-11-22 DIAGNOSIS — M47816 Spondylosis without myelopathy or radiculopathy, lumbar region: Secondary | ICD-10-CM | POA: Diagnosis not present

## 2022-11-28 DIAGNOSIS — M5451 Vertebrogenic low back pain: Secondary | ICD-10-CM | POA: Diagnosis not present

## 2022-12-06 DIAGNOSIS — M5451 Vertebrogenic low back pain: Secondary | ICD-10-CM | POA: Diagnosis not present

## 2022-12-08 DIAGNOSIS — M5451 Vertebrogenic low back pain: Secondary | ICD-10-CM | POA: Diagnosis not present

## 2022-12-13 DIAGNOSIS — M5451 Vertebrogenic low back pain: Secondary | ICD-10-CM | POA: Diagnosis not present

## 2022-12-15 DIAGNOSIS — M5451 Vertebrogenic low back pain: Secondary | ICD-10-CM | POA: Diagnosis not present

## 2022-12-20 DIAGNOSIS — M5451 Vertebrogenic low back pain: Secondary | ICD-10-CM | POA: Diagnosis not present

## 2022-12-23 DIAGNOSIS — M5451 Vertebrogenic low back pain: Secondary | ICD-10-CM | POA: Diagnosis not present

## 2022-12-27 DIAGNOSIS — M5451 Vertebrogenic low back pain: Secondary | ICD-10-CM | POA: Diagnosis not present

## 2023-01-09 DIAGNOSIS — M47896 Other spondylosis, lumbar region: Secondary | ICD-10-CM | POA: Diagnosis not present

## 2023-01-09 DIAGNOSIS — M545 Low back pain, unspecified: Secondary | ICD-10-CM | POA: Diagnosis not present

## 2023-01-09 DIAGNOSIS — M791 Myalgia, unspecified site: Secondary | ICD-10-CM | POA: Diagnosis not present

## 2023-01-10 DIAGNOSIS — Z1231 Encounter for screening mammogram for malignant neoplasm of breast: Secondary | ICD-10-CM | POA: Diagnosis not present

## 2023-01-10 LAB — HM MAMMOGRAPHY

## 2023-01-12 DIAGNOSIS — M791 Myalgia, unspecified site: Secondary | ICD-10-CM | POA: Diagnosis not present

## 2023-01-13 ENCOUNTER — Encounter: Payer: Self-pay | Admitting: Internal Medicine

## 2023-01-16 ENCOUNTER — Telehealth: Payer: Self-pay

## 2023-01-16 DIAGNOSIS — H2513 Age-related nuclear cataract, bilateral: Secondary | ICD-10-CM | POA: Diagnosis not present

## 2023-01-16 DIAGNOSIS — H04123 Dry eye syndrome of bilateral lacrimal glands: Secondary | ICD-10-CM | POA: Diagnosis not present

## 2023-01-16 DIAGNOSIS — H1045 Other chronic allergic conjunctivitis: Secondary | ICD-10-CM | POA: Diagnosis not present

## 2023-01-16 DIAGNOSIS — H401131 Primary open-angle glaucoma, bilateral, mild stage: Secondary | ICD-10-CM | POA: Diagnosis not present

## 2023-01-16 DIAGNOSIS — H43813 Vitreous degeneration, bilateral: Secondary | ICD-10-CM | POA: Diagnosis not present

## 2023-01-16 NOTE — Telephone Encounter (Signed)
Patient's nice Chelsea Cantrell called to say that she has become patient's power of attorney and that patient will need a family member at her appointments because she will forget everything discussed during her visit. She wanted [place] to make a note on her chart.

## 2023-02-02 DIAGNOSIS — J3081 Allergic rhinitis due to animal (cat) (dog) hair and dander: Secondary | ICD-10-CM | POA: Diagnosis not present

## 2023-02-02 DIAGNOSIS — J301 Allergic rhinitis due to pollen: Secondary | ICD-10-CM | POA: Diagnosis not present

## 2023-02-02 DIAGNOSIS — J3089 Other allergic rhinitis: Secondary | ICD-10-CM | POA: Diagnosis not present

## 2023-02-02 DIAGNOSIS — J4530 Mild persistent asthma, uncomplicated: Secondary | ICD-10-CM | POA: Diagnosis not present

## 2023-02-28 DIAGNOSIS — M791 Myalgia, unspecified site: Secondary | ICD-10-CM | POA: Diagnosis not present

## 2023-02-28 DIAGNOSIS — M47896 Other spondylosis, lumbar region: Secondary | ICD-10-CM | POA: Diagnosis not present

## 2023-02-28 DIAGNOSIS — M545 Low back pain, unspecified: Secondary | ICD-10-CM | POA: Diagnosis not present

## 2023-03-08 ENCOUNTER — Encounter: Payer: Self-pay | Admitting: Psychology

## 2023-03-08 ENCOUNTER — Encounter: Payer: Medicare Other | Attending: Psychology | Admitting: Psychology

## 2023-03-08 DIAGNOSIS — F0670 Mild neurocognitive disorder due to known physiological condition without behavioral disturbance: Secondary | ICD-10-CM | POA: Insufficient documentation

## 2023-03-08 DIAGNOSIS — G309 Alzheimer's disease, unspecified: Secondary | ICD-10-CM | POA: Diagnosis not present

## 2023-03-08 NOTE — Progress Notes (Signed)
Neuropsychological Evaluation   Patient:  Chelsea Cantrell   DOB: 1945/10/23  MR Number: [id number]  Location: [facility] [place] [facility] [address] [place], [address] [place] [address] Dept: [phone]  Start: 8 AM End: 9 AM  Provider/Observer:     Hershal Coria PsyD  Chief Complaint:      Chief Complaint  Patient presents with   Memory Loss   Other    Word Finding Difficulties Geographic Disorientation    Reason For Service:     Alahia Whicker is a 77 year old female referred for neuropsychological evaluation by her primary care provider Marlan Palau, MD, due to concerns verbalized by the patient's friend as well as the patient describing issues around memory difficulties, geographic disorientation and some word finding difficulties.  The symptoms are described as potentially developing over the past 3 to 4 years.  The patient has also been seen by Joycelyn Schmid, MD for for neurological consultation due to these memory issues and memory loss.  The patient has a past medical history that includes a history of asthma, GI difficulties including irritable bowel syndrome, pancreatitis and previous diagnosis of seasonal affective disorder.  The patient has acknowledged times of worry but no significant history of anxiety disorder.  The patient is taking no current psychotropic medications.   The patient was seen by her neurologist in September of last year with diagnoses of memory loss associated with possible mild cognitive impairment versus normal age-related condition versus attentional issues and mild anxiety.  The patient's visit with Dr. Lenord Fellers have included discussions of concerns from the patient's sister and a friend around the patient driving and memory loss.  The driving is concerning issues of the patient getting lost while driving on a limited basis.  The patient reports that she does rely on  GPS to navigate places that she is not particularly familiar with.  The patient acknowledges having a limited number of times where she knew where she was going and was a destination that she was relatively familiar with but got turned around and had difficulty navigating without GPS.   The patient had a recent MRI/brain conducted on 02/07/2022 that found no acute process and brain structures appeared to be of appropriate/normal size.  There was some scattered periventricular and subcortical foci of chronic small vessel ischemic disease that was described as mild and no other acute findings.   Pertinent lab work previously conducted to include B12 and folate acid studies both of which were within normal limits.  The patient does have a good diet and is a vegan and maintains good dietary habits.   During the clinical interview today, the patient reports that she has had some issues with memory particularly retrieving recently learned information.  Patient uses memory aids including writing information down and maintaining a day planner and schedule/calendar.  The patient describes long-term memory to be good with no change.  Patient describes more issues with semantic type new learning and remembering things that are told to her and information types of events rather than primary episodic memory deficits.  The patient reports that when she does have difficulty remembering something that when she notes it on her day planner that she will often remember this with cueing.  The patient reports that when she has difficulty finding the words that she wants to say that it will usually come back later or if someone predicts the word for her that  she will know if it is right or not.  The patient denies significant circumlocution or paraphasic errors with speech.   The patient reports that she is not dealing with any significant current medical issues that could explain her difficulties but has been dealing with some  chronic back pain issues and being followed by orthopedics.  Only prior hospitalization was for right hip replacement surgery.  The patient denies any history of concussive events with loss of consciousness or altered consciousness, although she has had some falls.  The patient reports that she gets regular exercise including extended time walking her dog each day and the patient goes to Pilates and exercise group type classes on a regular basis.  The patient describes good sleep habits and denies any symptoms consistent with obstructive sleep apnea although I did suggest she may want to get a recordable pulse ox meter/blood oxygen level meter that she could indirectly assess for significant episodes of dropped oxygen levels during sleep.  The patient takes melatonin at night to aid with sleep but reports that she typically falls asleep fine.  Patient goes to sleep around 8 PM at night and with the exception of rare events will fall asleep and typically stay asleep throughout the night.  The patient gets up around 5 AM and reports that she feels rested in the AM.  Patient describes appetite as good and that she is a vegan with good variation in her diet.   The patient denies any tremors, visual hallucinations or auditory hallucinations and is not aware of any other cognitive domains besides memory and expressive language functions and geographic disorientation being present.  Medical History:                         Past Medical History:  Diagnosis Date   Allergy     Arthritis     Asthma      exercise induced   Colon polyps     Constipation     IBS (irritable bowel syndrome)     Osteoporosis 03/2015    T score -2.6 left forearm. Left femoral neck -1.4, AP spine 0.1   Pancreatitis     Schatzki's ring     Seasonal affective disorder (HCC)     Spinal stenosis     Thyroid disease      Hypo                                                             Patient Active Problem List    Diagnosis  Date Noted   Allergic rhinitis due to animal (cat) (dog) hair and dander 05/07/2020   Allergic rhinitis due to pollen 05/07/2020   Chronic allergic conjunctivitis 05/07/2020   Exacerbation of intermittent asthma 05/07/2020   Mild intermittent asthma 05/07/2020   Mild persistent asthma, uncomplicated 05/07/2020   Pain in right hand 05/01/2018   Chronic idiopathic constipation 06/16/2017   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   Elevated LFTs 10/06/2014   Acute pancreatitis 09/20/2014   Pancreatitis 06/09/2014   Abdominal pain 06/09/2014   Transaminitis 06/09/2014   UTI (urinary tract infection) 06/09/2014   Epigastric pain     Pain in limb 12/27/2012   Varicose veins of lower extremities with other complications 11/08/2012  IBS (irritable bowel syndrome)     Hyperplastic colon polyp 03/12/2012   GE reflux 03/27/2011   Osteoarthritis 03/27/2011   Exercise-induced asthma 03/27/2011   Allergic rhinitis 03/27/2011   Hypothyroidism 03/27/2011   Spinal stenosis 03/27/2011   Seasonal affective disorder (HCC) 03/27/2011    Tests Administered: Controlled Oral Word Association Test (COWAT; FAS & Animals)  Wechsler Adult Intelligence Scale, 4th Edition (WAIS-IV) Wechsler Memory Scale, 4th Edition (WMS-IV); Older Adult Battery  Participation Level:   Active  Participation Quality:  Appropriate      Behavioral Observation:  The patient appeared well-groomed and appropriately dressed. Her manners were polite and appropriate to the situation. The patient's attitude towards testing was positive and her effort was good.   Well Groomed, Alert, and Appropriate.   Test Results:   Initially, an estimation was made as to the patient's premorbid intellectual and cognitive capacities and abilities in order to provide a comparison point for current objective testing data.  The patient grew up in [facility] and is lived in [place] for the past 30 years.  No major or outstanding  medical issues or difficulties were noted during childhood.  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  It is estimated conservatively that the patient had premorbid intellectual and cognitive functioning in at least the high average range relative to a normative population and it is likely that some individual components of cognition and intellectual functioning were in the high average to superior range.  We will utilize a comparison point for many of the cognitive measures to likely been 1 standard deviation or more from the normative comparison populations.  Secondly, an estimation was made as to the validity of the current assessment and the patient's participation level.  The patient appeared to approach this testing in a straightforward and honest fashion neither attempting to exaggerate or minimize her current symptoms.  Embedded validity checks including aspects of the digit span test, forced choice recognition challenges etc. all suggested a valid assessment and there was great consistency in test performance noted.  This does appear to be a fair and valid estimate of the patient's current status.  COWAT:  FAS total=16 Z= -1.47 Animals total= 11 Z= -1.28  As the patient has described some issues with word finding and expressive language changes without paraphasic errors patient was administered the control or word association test.  On the FAS test which assesses lexical fluency the patient performed roughly 1-1/2 standard deviations below normative comparisons.  As this particular measure has adjustments for gender, age and education the current performance is estimated to be roughly 1 standard deviation below normative expectations.  A similar finding was also noted on  the animal naming subtest which assesses semantic fluency.  The patient performed a little bit better but still approached 1-1/2 standard deviations below normative comparison groups and I suspect that this does represent changes from premorbid capacities given her vocational experience working as an Psychologist, educational and running a company.  There does appear to be objective findings of both lexical and semantic fluency loss.     WAIS-IV:           Composite Score Summary          Scale Sum of Scaled Scores Composite Score Percentile Rank 95% Conf. Interval Qualitative  Description  Verbal Comprehension 25 VCI 91 27 86-97 Average  Perceptual Reasoning 26 PRI 92 30 86-99 Average  Working Memory 17 WMI 92 30 86-99 Average  Full Scale 86 FSIQ 90 25 86-94 Average  General Ability 51 GAI 91 27 86-96 Average    The patient was also administered the Wechsler Adult Intelligence Scale-IV to provide a thorough assessment of a wide range of cognitive domains and a highly structured excellently normed battery of measures.  The choice of this assessment tool was made to allow for repeat testing if needed and allow for standardization of assessment in the future.  While the patient and her friends are reporting change in memory and other cognitive domains the patient's current score should not be used as a Engineer, building services of her lifelong intellectual and cognitive functioning levels but a description of her current status.  2 composite scores were calculated with this measure.  The patient produced a full-scale IQ score of 90, which falls at the 27th percentile relative to a normative population.  A very similar score was derived for her general abilities index score where she produced a composite score of 91, which fell at the 27th percentile.  Both of these global measures are at the very lower end of the average range relative to a normative population.  The patient's performance begins to approach nearly 2 standard  deviations below predicted levels of premorbid functioning and do suggest that 2 or more domains of cognitive functioning are representative of change and loss from premorbid functioning.  There was great consistency between composite score performances all of which fell at the lower end of the average range relative to a normative population including global indices for verbal comprehension and verbal based skills, visual-spatial and visual reasoning capacity, auditory encoding/working memory components.          Verbal Comprehension Subtests Summary        Subtest Raw Score Scaled Score Percentile Rank Reference Group Scaled Score SEM  Similarities 15 6 9 5  0.95  Vocabulary 35 10 50 10 0.67  Information 13 9 37 10 0.73    The patient produced a verbal comprehension index score of 91 which falls at the 27th percentile and in the average range.  This performance is well below what I would have predicted based on her education and occupational history and represents a moderate/significant change from premorbid estimates.  There was considerable variability noted in subtest performance with the patient maintaining generally average capacities relative to a normative population for her vocabulary knowledge and ability to retrieve general fund of information and basic life experienced information.  The patient showed significant weakness with regard to verbal reasoning and problem-solving and given the patient's history this is likely one of the areas that she excelled in during her life and I suspect that this represents a significant change in cognitive flexibility and reasoning and problem-solving capacity.          Perceptual Reasoning Subtests Summary        Subtest Raw Score Scaled Score Percentile Rank Reference Group Scaled Score SEM  Block Design 28 9 37 6 0.99  Matrix Reasoning 11 9 37 6 0.90  Visual Puzzles 8 8 25 6  0.99    The patient produced a perceptual reasoning index score of 92  which falls at the 30th percentile and is in the lower end of the average range relative to a normative population.  There was little variability or scatter in subtest performance.  The patient performed somewhat lower than predicted levels on measures of her capacity to analyze geometric patterns performing part-whole recognition skills, nonverbal reasoning skills and broad visual intelligence and fluid reasoning skills requiring visual processing capacity with attention to detail.  While none of these are significantly are severely impaired I do suspect that they represent some individual loss from premorbid functioning levels.          Working Comptroller Raw Score Scaled Score Percentile Rank Reference Group Scaled Score SEM  Digit Span 21 8 25 6  0.73  Arithmetic 12 9 37 9 1.20    The patient produced a working memory index score of 92 which falls at the 30th percentile and in the lower end of the average range relative to a normative population.  There was little to no scatter or variability in subtest performance.  Given the patient's premorbid estimates and work history I suspect this also represents some significant change in capacity.  The patient performed in the lower end of the average range and at the 25th percentile relative to a normative population with regard to primary auditory encoding although the patient was able to actively process information in her auditory Register.  This level of auditory encoding would not preclude the capacity to learn new auditory information but would likely make that ability somewhat more challenging and difficulty than she likely has been historically.          Processing Speed Subtests Summary        Subtest Raw Score Scaled Score Percentile Rank Reference Group Scaled Score SEM  Coding 45 9 37 5 1.12    The patient showed average levels of performance with regard to information processing speed there does not appear to be  significant changes in this capacity.  The patient performed at the 37th percentile and only slightly below predicted levels of premorbid functioning for visual scanning, visual searching and overall speed of mental operations.  WMS-IV:         Index Score Summary        Index Sum of Scaled Scores Index Score Percentile Rank 95% Confidence Interval Qualitative Descriptor  Auditory Memory (AMI) 22 74 4 69-82 Borderline  Visual Memory (VMI) 9 69 2 65-75 Extremely Low  Immediate Memory (IMI) 19 77 6 72-84 Borderline  Delayed Memory (DMI) 12 63 1 58-74 Extremely Low    The patient was also administered the Wechsler Memory Scale-IV to provide a thorough assessment of a broad range of memory and learning domains in a systematic and well normed battery of memory and learning.  On the Wechsler Adult Intelligence Scale the patient performed in the average range with regard to auditory encoding and her capacity to process information in her auditory Register.  The patient produced a similar performance with regard to visual encoding capacity with her auditory and visual encoding capacities one of her more well-preserved cognitive domains.  This level of auditory and visual encoding and ability to process information in her auditory Register would not preclude the patient's capacity to learn and remember information this in conjunction with measures of visual scanning and searching and information processing speed would suggest that the subjective reports of changes in memory and cognition are not simply due to attentional deficits but more related to issues with her capacity to store and organize and maintain information over time.  Breaking memory functions down between auditory versus visual memory the patient produced an  auditory memory index score of 74 which falls at the 4th percentile and is in the borderline range relative to a normative population.  A very similar performance was noted for visual memory  and learning where she produced a visual memory index score of 69, which falls at the 2nd percentile and in the extremely low range relative to a normative population.  Given the average level of performance on attentional variables including encoding and focus execute ability this would suggest a primary and consistent weakness for both auditory and visual memory and learning including deficits in storage and organizational capacity.  Breaking memory functions down between immediate memory versus delayed memory the patient produced an immediate memory index score of 77, which falls at the 6 percentile and in the borderline range relative to a normative population.  The patient had significant loss of information over period of delay and even with the limited storage and organization of information initially she progressively lost information over time.  The patient produced a delayed memory index score of 63 which falls at the 1st percentile and in the extremely low range relative to a normative population.  Further analysis utilizing recognition and cueing did not suggest a significant improvement in performance indicating that her memory and learning deficits are also not primarily related to retrieval deficits but inefficiencies and difficulties storing and organizing new information and retaining that information over time rather than either attentional deficits or retrieval deficits.  There was great consistency between auditory and visual memory and learning performances suggesting a lack of lateralization or focal hemispheric changes and consistent changes in both the right and left brain hemispheres.           Primary Subtest Scaled Score Summary       Subtest Domain Raw Score Scaled Score Percentile Rank  Logical Memory I AM 14 4 2   Logical Memory II AM 3 3 1   Verbal Paired Associates I AM 11 7 16   Verbal Paired Associates II AM 4 8 25   Visual Reproduction I VM 26 8 25   Visual Reproduction II  VM 0 1 0.1  Symbol Span VWM 15 9 37          Auditory Memory Process Score Summary      Process Score Raw Score Scaled Score Percentile Rank Cumulative Percentage (Base Rate)  LM II Recognition 18 - - 26-50%  VPA II Recognition 25 - - 17-25%  VPA II Word Recall 4 5 5  -           Visual Memory Process Score Summary      Process Score Raw Score Scaled Score Percentile Rank Cumulative Percentage (Base Rate)  VR II Recognition 2 - - 10-16%  VR II Copy 43 - - >75%    ABILITY-MEMORY ANALYSIS   Ability Score:    VCI: 91 Date of Testing:           WAIS-IV; WMS-IV 2022/08/09             Predicted Difference Method    Index Predicted WMS-IV Index Score Actual WMS-IV Index Score Difference Critical Value   Significant Difference Y/N Base Rate  Auditory Memory 95 74 21 10.41 Y 5-10%  Visual Memory 96 69 27 7.35 Y 2-3%  Immediate Memory 95 77 18 9.69 Y 5-10%  Delayed Memory 95 63 32 12.22 Y <1%   We also calculated the patient's ability-memory analysis which utilized her current verbal comprehension index score of 91 to produce an estimation or  prediction of various memory and learning indices and then compare actual performances to these predictions.  Even with the adjustments from comparisons to premorbid functioning to current language-based functioning the patient showed significant deficits in memory and learning and under performed predicted levels consistently in all domains assessed with differences from prediction to achievement ranging from 18-32 points.  The most dramatic difference had to do with delayed memory functions.  Summary of Results:   Overall, the results of the current neuropsychological evaluation do show great consistencies between the patient's subjective reports provided by both the patient as well as close friends and the resulting objective neuropsychological assessment.  The patient displayed objective findings of loss in both lexical fluency and semantic  fluency, a global depression of overall cognitive capacity with consistent and mild-moderate changes from premorbid estimates for measures of language base skills and utilization capacity particularly for verbal reasoning and problem-solving, changes in visual spatial and visual constructional abilities with greatest difficulties around nonverbal reasoning skills and fluid reasoning skills with difficulty processing visual details efficiently.  The patient showed some cognitive loss but the least among most of the cognitive domains assessed with regard to auditory and visual encoding capacity and her ability to process information in active visual or auditory Register.  This was still below predicted levels of premorbid functioning this capacity but was a relatively most preserved cognitive domain.  Even with this attentional capacity generally intact the patient showed significant and severe deficits for her capacity to store, organize and maintain both visual and auditory information.  The patient shows the capacity to adequately process information sufficient to keep up with common communication speeds of others in her capacity to encode information adequately but the deficits in storage and organization and maintenance of information both visually and auditorily was consistently demonstrated on a number of different individual measures.  Impression/Diagnosis:   The results of the current neuropsychological evaluation do support and are consistent with subjective reports by the patient as well as her friends of a progressive decline over the past 3 to 4 years with increasing difficulties noted by the patient and her friend for visual-spatial capacity and geographic orientation, changes in global capacity, changes in both lexical and semantic fluency.  The most significant area of cognitive change is in the memory domain with specific loss in her capacity to store, organize and maintain information over time  that was seen for both auditory and visual learning measures and consistent with subjective reports by the patient and her friend.  Geographic disorientation is described to be happening in real-world situations in the patient's reliance on GPS to navigate even familiar locations as noted.  The patient denies any development of tremors and reports that she is actually sleeping quite well on the majority of nights.  There were no indications of obstructive sleep apnea noted.  As far as diagnostic considerations, the current objective neuropsychological evaluation would suggest a diagnosis beyond simply mild cognitive impairment with memory loss as there are multiple cognitive domains being impacted including memory and learning, auditory and visual reasoning and problem-solving, changes in cognitive flexibility, visual-spatial and visual constructional changes, geographic disorientation, and only mild changes in attentional variables related to auditory and visual encoding and overall information processing speeds.  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  There has been noted continued mild but steady progression over the past 3 or  4 years consistent with a late onset presentation.  The patient has been followed by neurology and was seen by Dr. Marjory Lies in September 2023 and I would suggest Dr. Lenord Fellers and Dr. Marjory Lies work together regarding potential medication interventions.  There are number of aspects to consider and specific medications and I will leave it up to their discretion about the particular pathway to go down.  We may consider doing some blood work (ATN profile) to provide further assessment of this current diagnostic consideration.  I will sit down with the patient and go over the results of the current neuropsychological evaluation with specific recommendations  for her.    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 Clinical Neuropsychologist

## 2023-03-22 ENCOUNTER — Ambulatory Visit: Payer: Medicare Other | Admitting: Psychology

## 2023-03-22 ENCOUNTER — Encounter (HOSPITAL_BASED_OUTPATIENT_CLINIC_OR_DEPARTMENT_OTHER): Payer: Medicare Other | Admitting: Psychology

## 2023-03-22 DIAGNOSIS — G309 Alzheimer's disease, unspecified: Secondary | ICD-10-CM

## 2023-03-22 DIAGNOSIS — F0670 Mild neurocognitive disorder due to known physiological condition without behavioral disturbance: Secondary | ICD-10-CM | POA: Diagnosis not present

## 2023-03-23 ENCOUNTER — Telehealth: Payer: Self-pay | Admitting: Internal Medicine

## 2023-03-23 NOTE — Telephone Encounter (Signed)
When I called Chelsea Cantrell to schedule CPE, dor late December or January she wanted schedule an appointment for next week to discuss her recent alzheimer diagnoses. So I scheduled her.

## 2023-03-27 ENCOUNTER — Ambulatory Visit: Payer: Medicare Other | Admitting: Psychology

## 2023-03-28 ENCOUNTER — Ambulatory Visit (INDEPENDENT_AMBULATORY_CARE_PROVIDER_SITE_OTHER): Payer: Medicare Other | Admitting: Internal Medicine

## 2023-03-28 ENCOUNTER — Encounter: Payer: Self-pay | Admitting: Internal Medicine

## 2023-03-28 VITALS — BP 130/80 | HR 71 | Ht 66.5 in | Wt 146.0 lb

## 2023-03-28 DIAGNOSIS — R6889 Other general symptoms and signs: Secondary | ICD-10-CM

## 2023-03-28 DIAGNOSIS — R413 Other amnesia: Secondary | ICD-10-CM | POA: Diagnosis not present

## 2023-03-28 MED ORDER — DONEPEZIL HCL 5 MG PO TABS: 5.0000 mg | ORAL_TABLET | Freq: Every day | ORAL | 2 refills | Status: DC

## 2023-03-28 NOTE — Progress Notes (Addendum)
Patient Care Team: Margaree Mackintosh, MD as PCP - General (Internal Medicine)  Visit Date: 03/28/23  Subjective:    Patient ID: Chelsea Cantrell , Female   DOB: 1946-04-16, 77 y.o.    MRN: [id number]   77 y.o. Female presents today for memory problems. Seen by Dr. Marjory Cantrell in 01/2022 for mild memory loss. 02/07/22 MRI brain showed no abnormal lesions, no evidence of mass effect or midline shift, mild scattered chronic small vessel ischemic disease. Then seen in 06/2022 by Dr. Kieth Cantrell for mild cognitive impairment with memory loss and again in 02/2023 for worsening memory loss and neuropsychological evaluation was most consistent with mild major neurocognitive disorder most consistent with Alzheimer's type presentation. Dr. Kieth Cantrell notes continued mild but steady progression over the past 3-4 years consistent with late onset presentation.   She [place] in a safe area of [place]. She drives only occasionally, mainly to the grocery store that is near her home. Her sister is an Production designer, theatre/television/film for [facility] in [place], [place]. This is where she plans to go if necessary.  Past Medical History:  Diagnosis Date   Allergy    Arthritis    Asthma    exercise induced   Colon polyps    Constipation    IBS (irritable bowel syndrome)    Osteoporosis 03/2015   T score -2.6 left forearm. Left femoral neck -1.4, AP spine 0.1   Pancreatitis    Schatzki's ring    Seasonal affective disorder (HCC)    Spinal stenosis    Thyroid disease    Hypo     Family History  Problem Relation Age of Onset   Stroke Mother    Osteoporosis Mother    Heart disease Father    Hyperlipidemia Father    Lung cancer Father     Social History   Social History Narrative   Social history: Single, never married.  No children.  Retired as an Psychologist, educational for [employer].  Did smoke prior to 1976 but currently non-smoker.  She is a vegetarian.  She drinks wine.  Exercises routinely.        Family history: Father died at age 33 of lung cancer with history of MI at age 53.  Father also has hyperlipidemia.  Mother died at age 61 but had a stroke 4 years before she passed away.  1 brother and 1 sister in good health.          Review of Systems  Constitutional:  Negative for fever and malaise/fatigue.  HENT:  Negative for congestion.   Eyes:  Negative for blurred vision.  Respiratory:  Negative for cough and shortness of breath.   Cardiovascular:  Negative for chest pain, palpitations and leg swelling.  Gastrointestinal:  Negative for vomiting.  Musculoskeletal:  Negative for back pain.  Skin:  Negative for rash.  Neurological:  Negative for loss of consciousness and headaches.  Psychiatric/Behavioral:  Positive for memory loss.         Objective:   Vitals: BP 130/80   Pulse 71   Ht 5' 6.5" (1.689 m)   Wt 146 lb (66.2 kg)   SpO2 98%   BMI 23.21 kg/m    Physical Exam Vitals and nursing note reviewed.  Constitutional:      General: She is not in acute distress.    Appearance: Normal appearance. She is not toxic-appearing.  HENT:     Head: Normocephalic and atraumatic.  Pulmonary:     Effort:  Pulmonary effort is normal.  Skin:    General: Skin is warm and dry.  Neurological:     Mental Status: She is alert and oriented to person, place, and time. Mental status is at baseline.  Psychiatric:        Mood and Affect: Mood normal.        Behavior: Behavior normal.        Thought Content: Thought content normal.        Judgment: Judgment normal.       Results:   Studies obtained and personally reviewed by me:  02/07/22 MRI brain showed no abnormal lesions, no evidence of mass effect or midline shift, mild scattered chronic small vessel ischemic disease.  Labs:       Component Value Date/Time   NA 140 05/12/2022 1018   K 5.0 05/12/2022 1018   CL 106 05/12/2022 1018   CO2 26 05/12/2022 1018   GLUCOSE 101 (H) 05/12/2022 1018   BUN 15 05/12/2022 1018    CREATININE 1.05 (H) 05/12/2022 1018   CALCIUM 9.1 05/12/2022 1018   PROT 6.2 05/12/2022 1018   ALBUMIN 4.2 04/25/2016 1000   AST 30 05/12/2022 1018   ALT 23 05/12/2022 1018   ALKPHOS 56 04/25/2016 1000   BILITOT 0.6 05/12/2022 1018   GFRNONAA 63 05/07/2020 0930   GFRAA 73 05/07/2020 0930     Lab Results  Component Value Date   WBC 4.6 05/12/2022   HGB 13.4 05/12/2022   HCT 38.8 05/12/2022   MCV 90.4 05/12/2022   PLT 326 05/12/2022    Lab Results  Component Value Date   CHOL 199 05/12/2022   HDL 87 05/12/2022   LDLCALC 97 05/12/2022   TRIG 65 05/12/2022   CHOLHDL 2.3 05/12/2022    No results found for: "HGBA1C"   Lab Results  Component Value Date   TSH 2.24 08/18/2022      Assessment & Plan:   Memory loss: start Aricept 5 mg daily. Advised against driving at night. Will reevaluate during physical on 05/26/23. Can increase to 10 mg daily. I will also refer her to Neurology for additional recommendations on medications. She has plans to move to retirement home near [place] where her niece is an Engineer, maintenance (IT) when she feels she can no longer reside alone. She has a dog currently, owns her own home in a good neighborhood not far from the grocery store and pharmacy. Agrees best not to drive at night.had extensive testing with Dr. Kieth Cantrell.  Hypothyroidism-continue thyroid replacement medication at current dose  Health maintenance- vaccines discussed  I,Chelsea Cantrell,acting as a scribe for Margaree Mackintosh, MD.,have documented all relevant documentation on the behalf of Margaree Mackintosh, MD,as directed by  Margaree Mackintosh, MD while in the presence of Margaree Mackintosh, MD.   I, Margaree Mackintosh, MD, have reviewed all documentation for this visit. The documentation on 03/31/23 for the exam, diagnosis, procedures, and orders are all accurate and complete.

## 2023-03-29 DIAGNOSIS — Z23 Encounter for immunization: Secondary | ICD-10-CM | POA: Diagnosis not present

## 2023-03-31 DIAGNOSIS — R413 Other amnesia: Secondary | ICD-10-CM | POA: Insufficient documentation

## 2023-03-31 NOTE — Patient Instructions (Addendum)
Start Aricept 5 mg by mouth daily. Referral to [facility] Neurology for advice for medication for memory loss going forward.Continue thyroid replacement medication.Has had extensive testing with Dr. Kieth Brightly

## 2023-04-04 DIAGNOSIS — Z23 Encounter for immunization: Secondary | ICD-10-CM | POA: Diagnosis not present

## 2023-04-06 ENCOUNTER — Encounter: Payer: Self-pay | Admitting: Internal Medicine

## 2023-04-06 ENCOUNTER — Encounter: Payer: Self-pay | Admitting: Psychology

## 2023-04-06 NOTE — Progress Notes (Signed)
Neuropsychological Evaluation   Patient:  Chelsea Cantrell   DOB: May 26, 1945  MR Number: [id number]  Location: [facility] A DEPT OF [facility] [address] [place], [place] [address] [place] [address] Dept: [phone]  Start: 8 AM End: 9 AM  Provider/Observer:     Hershal Coria PsyD  Chief Complaint:      Chief Complaint  Patient presents with   Memory Loss   Other    Word finding and expressive language changes in geographic disorientation   03/22/2023 8 AM-9 AM: Today I provided feedback regarding the results of the recent neuropsychological evaluation and went over the considerations of various diagnostic criterion with the patient needing a diagnostic criterion of a major neurocognitive disorder rather than isolated memory loss.  Overall medical history and medical information, subjective reports and objective neuropsychological test data would suggest that the cognitive changes are likely associated with an Alzheimer's type pathology.  I reviewed these issues and discussed what the next steps would be regarding her ongoing care.  I have included a copy of the reason for service and summary of my full neuropsychological evaluation below for convenience and it can be found in its entirety in the patient's electronic medical records dated 03/08/2023.   Reason For Service:     Chelsea Cantrell is a 77 year old female referred for neuropsychological evaluation by her primary care provider Marlan Palau, MD, due to concerns verbalized by the patient's friend as well as the patient describing issues around memory difficulties, geographic disorientation and some word finding difficulties.  The symptoms are described as potentially developing over the past 3 to 4 years.  The patient has also been seen by Joycelyn Schmid, MD for for neurological consultation due to these memory issues and memory  loss.  The patient has a past medical history that includes a history of asthma, GI difficulties including irritable bowel syndrome, pancreatitis and previous diagnosis of seasonal affective disorder.  The patient has acknowledged times of worry but no significant history of anxiety disorder.  The patient is taking no current psychotropic medications.   The patient was seen by her neurologist in September of last year with diagnoses of memory loss associated with possible mild cognitive impairment versus normal age-related condition versus attentional issues and mild anxiety.  The patient's visit with Dr. Lenord Fellers have included discussions of concerns from the patient's sister and a friend around the patient driving and memory loss.  The driving is concerning issues of the patient getting lost while driving on a limited basis.  The patient reports that she does rely on GPS to navigate places that she is not particularly familiar with.  The patient acknowledges having a limited number of times where she knew where she was going and was a destination that she was relatively familiar with but got turned around and had difficulty navigating without GPS.   The patient had a recent MRI/brain conducted on 02/07/2022 that found no acute process and brain structures appeared to be of appropriate/normal size.  There was some scattered periventricular and subcortical foci of chronic small vessel ischemic disease that was described as mild and no other acute findings.   Pertinent lab work previously conducted to include B12 and folate acid studies both of which were within normal limits.  The patient does have a good diet and is a vegan and maintains good dietary habits.   During the  clinical interview today, the patient reports that she has had some issues with memory particularly retrieving recently learned information.  Patient uses memory aids including writing information down and maintaining a day planner and  schedule/calendar.  The patient describes long-term memory to be good with no change.  Patient describes more issues with semantic type new learning and remembering things that are told to her and information types of events rather than primary episodic memory deficits.  The patient reports that when she does have difficulty remembering something that when she notes it on her day planner that she will often remember this with cueing.  The patient reports that when she has difficulty finding the words that she wants to say that it will usually come back later or if someone predicts the word for her that she will know if it is right or not.  The patient denies significant circumlocution or paraphasic errors with speech.   The patient reports that she is not dealing with any significant current medical issues that could explain her difficulties but has been dealing with some chronic back pain issues and being followed by orthopedics.  Only prior hospitalization was for right hip replacement surgery.  The patient denies any history of concussive events with loss of consciousness or altered consciousness, although she has had some falls.  The patient reports that she gets regular exercise including extended time walking her dog each day and the patient goes to Pilates and exercise group type classes on a regular basis.  The patient describes good sleep habits and denies any symptoms consistent with obstructive sleep apnea although I did suggest she may want to get a recordable pulse ox meter/blood oxygen level meter that she could indirectly assess for significant episodes of dropped oxygen levels during sleep.  The patient takes melatonin at night to aid with sleep but reports that she typically falls asleep fine.  Patient goes to sleep around 8 PM at night and with the exception of rare events will fall asleep and typically stay asleep throughout the night.  The patient gets up around 5 AM and reports that she feels  rested in the AM.  Patient describes appetite as good and that she is a vegan with good variation in her diet.   The patient denies any tremors, visual hallucinations or auditory hallucinations and is not aware of any other cognitive domains besides memory and expressive language functions and geographic disorientation being present.  Medical History:                         Past Medical History:  Diagnosis Date   Allergy     Arthritis     Asthma      exercise induced   Colon polyps     Constipation     IBS (irritable bowel syndrome)     Osteoporosis 03/2015    T score -2.6 left forearm. 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   Elevated LFTs 10/06/2014   Acute pancreatitis 09/20/2014   Pancreatitis 06/09/2014   Abdominal pain 06/09/2014   Transaminitis 06/09/2014   UTI (urinary tract infection) 06/09/2014   Epigastric pain     Pain in limb 12/27/2012   Varicose veins of lower extremities with other complications 11/08/2012   IBS (irritable bowel syndrome)     Hyperplastic colon polyp 03/12/2012   GE reflux 03/27/2011   Osteoarthritis 03/27/2011   Exercise-induced asthma 03/27/2011   Allergic rhinitis 03/27/2011   Hypothyroidism 03/27/2011   Spinal stenosis 03/27/2011   Seasonal affective disorder (HCC) 03/27/2011      Impression/Diagnosis:   The results of the current neuropsychological evaluation do support and are consistent with subjective reports by the patient as well as her friends of a progressive decline over the past 3 to 4 years with increasing difficulties noted by the patient and her friend for visual-spatial capacity and geographic orientation, changes in global capacity, changes in both lexical and semantic fluency.  The most significant area of cognitive change is in the memory domain with specific loss in her capacity to store, organize and maintain information over time that was seen for both auditory and visual learning measures and consistent with subjective reports by the patient and her friend.  Geographic disorientation is described to be happening in real-world situations in the patient's reliance on GPS to navigate even familiar locations as noted.  The patient denies any development of tremors and reports that she is actually sleeping quite well on the majority of nights.  There were no indications of obstructive sleep apnea noted.  As far as diagnostic considerations, the current objective neuropsychological evaluation would suggest a diagnosis beyond simply mild cognitive impairment with memory loss as there are multiple cognitive domains being impacted including memory and learning, auditory and visual reasoning and problem-solving, changes in cognitive flexibility, visual-spatial and visual constructional changes, geographic disorientation, and only mild changes in attentional variables related to auditory and visual encoding and overall information processing speeds.  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  There has been noted continued mild but steady progression over the past 3 or 4 years consistent with a late onset presentation.  The patient has been  followed by neurology and was seen by Dr. Marjory Lies in September 2023 and I would suggest Dr. Lenord Fellers and Dr. Marjory Lies work together regarding potential medication interventions.  There are number of aspects to consider and specific medications and I will leave it up to their discretion about the particular pathway to go down.  We may consider doing some blood work (ATN profile) to provide further assessment of this current diagnostic consideration.  I will sit down with the patient and go over the results of the current neuropsychological evaluation with specific recommendations for her.    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 Clinical  Neuropsychologist

## 2023-05-25 ENCOUNTER — Other Ambulatory Visit: Payer: Medicare Other

## 2023-05-25 DIAGNOSIS — Z1329 Encounter for screening for other suspected endocrine disorder: Secondary | ICD-10-CM

## 2023-05-25 DIAGNOSIS — R413 Other amnesia: Secondary | ICD-10-CM

## 2023-05-25 DIAGNOSIS — Z0000 Encounter for general adult medical examination without abnormal findings: Secondary | ICD-10-CM

## 2023-05-25 DIAGNOSIS — R6889 Other general symptoms and signs: Secondary | ICD-10-CM | POA: Diagnosis not present

## 2023-05-25 DIAGNOSIS — Z13220 Encounter for screening for lipoid disorders: Secondary | ICD-10-CM | POA: Diagnosis not present

## 2023-05-25 NOTE — Progress Notes (Signed)
 Annual Wellness Visit   Patient Care Team: Perri Chelsea PARAS, MD as PCP - General (Internal Medicine)  Visit Date: 05/26/23   Chief Complaint  Patient presents with   Medicare Wellness   Subjective:  Patient:Chelsea Cantrell,Female DOB:1946-05-21, 78 y.o. FMW:[id number]  Chelsea Cantrell is a 78 y.o. Female who presents today for her Annual Wellness Visit. Patient has history of hypothyroidism, osteoarthritis, and memory loss  History of Hypothyroidism treated with 75 mcg Synthroid . 05/25/23 TSH 3.76, increased from 2.24 on 08/18/22. Does note that she has forgotten to take her medication in the past.    History of Memory Loss treated with 5 mg Donepezil . Expresses that she doesn't notice her memory loss. Her and her niece have begun to make plans to move her to the retirement home where her niece is an production designer, theatre/television/film. Has follow-up with Dr. Margaret in February 2025.   CBC: absolute lymphocytes was 741, otherwise WNL.  CMP: creatinine 1.04, eGFR 56, otherwise WNL.  Lipid Panel: cholesterol 240, LDL 136, non-HDL cholesterol 159.   PAP Smear 04/25/16 was normal with no repeat recommendation due to aging out.   Mammogram 11/10/22 was normal with repeat recommendation of 2025.  Colonoscopy 03/25/10 - no records of this in our system.  Bone DEXA 03/31/15 with Tscore -2.6 in Left UD forearm. History of Osteoarthritis treated with 15 mg Meloxicam . History of Right Hip Surgery in 2001, she expresses belief that her right hip is degenerating as she has started feeling some pain.   Vaccine Counseling: She notes that she has gotten vaccinations recently at her pharmacy - will contact. UTD on Tdap.   Past Medical History:  Diagnosis Date   Allergy    Arthritis    Asthma    exercise induced   Colon polyps    Constipation    IBS (irritable bowel syndrome)    Osteoporosis 03/2015   T score -2.6 left forearm. Left femoral neck -1.4, AP spine 0.1   Pancreatitis    Schatzki's ring    Seasonal  affective disorder (HCC)    Spinal stenosis    Thyroid  disease    Hypo    Family History  Problem Relation Age of Onset   Stroke Mother    Osteoporosis Mother    Heart disease Father    Hyperlipidemia Father    Lung cancer Father     Social History   Social History Narrative   Social history: Single, never married.  No children.  Retired as an psychologist, educational for [employer].  Did smoke prior to 1976 but currently non-smoker.  She is a vegetarian.  She drinks wine.  Exercises routinely.       Family history: Father died at age 2 of lung cancer with history of MI at age 10.  Father also has hyperlipidemia.  Mother died at age 69 but had a stroke 4 years before she passed away.  1 brother and 1 sister in good health.       Review of Systems  Constitutional:  Negative for chills, fever, malaise/fatigue and weight loss.  HENT:  Negative for hearing loss, sinus pain and sore throat.   Respiratory:  Negative for cough, hemoptysis and shortness of breath.   Cardiovascular:  Negative for chest pain, palpitations, leg swelling and PND.  Gastrointestinal:  Negative for abdominal pain, constipation, diarrhea, heartburn, nausea and vomiting.  Genitourinary:  Negative for dysuria, frequency and urgency.  Musculoskeletal:  Negative for back pain, myalgias and neck pain.  Skin:  Negative for itching and rash.  Neurological:  Negative for dizziness, tingling, seizures and headaches.  Endo/Heme/Allergies:  Negative for polydipsia.  Psychiatric/Behavioral:  Negative for depression. The patient is not nervous/anxious.     Objective:  Vitals: BP 110/70   Pulse 63   Ht 5' 6.5 (1.689 m)   Wt 141 lb (64 kg)   SpO2 99%   BMI 22.42 kg/m  Physical Exam Vitals and nursing note reviewed.  Constitutional:      General: She is not in acute distress.    Appearance: Normal appearance. She is not ill-appearing or toxic-appearing.  HENT:     Head: Normocephalic and atraumatic.     Right Ear: Hearing,  tympanic membrane, ear canal and external ear normal.     Left Ear: Hearing, tympanic membrane, ear canal and external ear normal.     Mouth/Throat:     Pharynx: Oropharynx is clear.  Eyes:     Extraocular Movements: Extraocular movements intact.     Pupils: Pupils are equal, round, and reactive to light.  Neck:     Thyroid : No thyroid  mass, thyromegaly or thyroid  tenderness.     Vascular: No carotid bruit.  Cardiovascular:     Rate and Rhythm: Normal rate and regular rhythm. No extrasystoles are present.    Pulses:          Dorsalis pedis pulses are 1+ on the right side and 1+ on the left side.     Heart sounds: Normal heart sounds. No murmur heard.    No friction rub. No gallop.  Pulmonary:     Effort: Pulmonary effort is normal.     Breath sounds: Normal breath sounds. No decreased breath sounds, wheezing, rhonchi or rales.  Chest:     Chest wall: No mass.  Abdominal:     Palpations: Abdomen is soft. There is no hepatomegaly, splenomegaly or mass.     Tenderness: There is no abdominal tenderness.     Hernia: No hernia is present.  Musculoskeletal:     Cervical back: Normal range of motion.     Right lower leg: No edema.     Left lower leg: No edema.  Lymphadenopathy:     Cervical: No cervical adenopathy.     Upper Body:     Right upper body: No supraclavicular adenopathy.     Left upper body: No supraclavicular adenopathy.  Skin:    General: Skin is warm and dry.  Neurological:     General: No focal deficit present.     Mental Status: She is alert and oriented to person, place, and time. Mental status is at baseline.     Sensory: Sensation is intact.     Motor: Motor function is intact. No weakness.     Deep Tendon Reflexes: Reflexes are normal and symmetric.  Psychiatric:        Attention and Perception: Attention normal.        Mood and Affect: Mood normal.        Speech: Speech normal.        Behavior: Behavior normal.        Thought Content: Thought content  normal.        Cognition and Memory: Cognition normal.        Judgment: Judgment normal.    Most recent functional status assessment:    05/26/2023   10:02 AM  In your present state of health, do you have any difficulty performing the following activities:  Hearing? 0  Vision?  0  Difficulty concentrating or making decisions? 0  Walking or climbing stairs? 0  Dressing or bathing? 0  Doing errands, shopping? 0  Preparing Food and eating ? N  Using the Toilet? N  In the past six months, have you accidently leaked urine? N  Do you have problems with loss of bowel control? N  Managing your Medications? N  Managing your Finances? N  Housekeeping or managing your Housekeeping? N   Most recent fall risk assessment:    05/26/2023   10:03 AM  Fall Risk   Falls in the past year? 0  Number falls in past yr: 0  Injury with Fall? 0  Follow up Falls prevention discussed;Education provided;Falls evaluation completed    Most recent depression screenings:    05/26/2023   10:15 AM 07/05/2022    9:38 AM  PHQ 2/9 Scores  PHQ - 2 Score 0 0   Most recent cognitive screening:    05/26/2023   10:09 AM  6CIT Screen  What Year? 0 points  What month? 0 points  What time? 0 points  Count back from 20 0 points  Months in reverse 2 points   Results:  Studies obtained and personally reviewed by me:  PAP Smear 04/25/16 was normal with no repeat recommendation due to aging out.   Mammogram 11/10/22 was normal with repeat recommendation of 2025.  Colonoscopy 03/25/10 - no records of this in our system.  Bone DEXA 03/31/15 with Tscore -2.6 in Left UD forearm.  02/07/22 MRI brain showed no abnormal lesions, no evidence of mass effect or midline shift, mild scattered chronic small vessel ischemic disease.   Labs:     Component Value Date/Time   NA 137 05/25/2023 0922   K 5.2 05/25/2023 0922   CL 101 05/25/2023 0922   CO2 26 05/25/2023 0922   GLUCOSE 93 05/25/2023 0922   BUN 13 05/25/2023 0922    CREATININE 1.04 (H) 05/25/2023 0922   CALCIUM 9.7 05/25/2023 0922   PROT 6.8 05/25/2023 0922   ALBUMIN 4.2 04/25/2016 1000   AST 22 05/25/2023 0922   ALT 13 05/25/2023 0922   ALKPHOS 56 04/25/2016 1000   BILITOT 0.8 05/25/2023 0922   GFRNONAA 63 05/07/2020 0930   GFRAA 73 05/07/2020 0930     Lab Results  Component Value Date   WBC 7.8 05/25/2023   HGB 13.6 05/25/2023   HCT 40.8 05/25/2023   MCV 88.5 05/25/2023   PLT 318 05/25/2023    Lab Results  Component Value Date   CHOL 240 (H) 05/25/2023   HDL 81 05/25/2023   LDLCALC 136 (H) 05/25/2023   TRIG 124 05/25/2023   CHOLHDL 3.0 05/25/2023   Lab Results  Component Value Date   TSH 3.76 05/25/2023    Assessment & Plan:  Hypothyroidism: was being treated with 75 mcg Levothyroxine . 05/25/23 TSH 3.76, increased from 2.24 on 08/18/22. Increasing to 88 mcg following increased TSH. Recheck in 6 weeks. Would like TSH to be around 1.00  Memory Loss: treated with 5 mg Donepezil  - will wait for her follow-up with Dr. Margaret in February 2025 to decide whether to increase. Accompanied by niece who works as an production designer, theatre/television/film at retirement center in [place]. Patient initially considered moving there but has now decided on another facility near [place] where more members of her family lives.  PAP Smear: 04/25/16 was normal with no repeat recommendation due to aging out.   Mammogram: 11/10/22 was normal with repeat recommendation of 2025.  Colonoscopy:  03/25/10 - no records of this in our system.  Bone DEXA: 03/31/15 with Tscore -2.6 in Left UD forearm.Due for follow up. This was done through GYN.   History of Osteoarthritis treated with 15 mg Meloxicam .   Vaccine Counseling: has gotten vaccinations recently at her pharmacy - will contact. UTD on Tdap.   Return in 6 weeks to recheck TSH,  and in 1 year for you annual visit unless you move to retirement facility this year, or as needed    Annual wellness visit done today including the  all of the following: Reviewed patient's Family Medical History Reviewed and updated list of patient's medical providers Assessment of cognitive impairment was done Assessed patient's functional ability Established a written schedule for health screening services Health Risk Assessent Completed and Reviewed  Discussed health benefits of physical activity, and encouraged her to engage in regular exercise appropriate for her age and condition.        I,Emily Lagle,acting as a neurosurgeon for Chelsea JINNY Hailstone, MD.,have documented all relevant documentation on the behalf of Chelsea JINNY Hailstone, MD,as directed by  Chelsea JINNY Hailstone, MD while in the presence of Chelsea JINNY Hailstone, MD.   I, Chelsea JINNY Hailstone, MD, have reviewed all documentation for this visit. The documentation on 06/04/23 for the exam, diagnosis, procedures, and orders are all accurate and complete.

## 2023-05-26 ENCOUNTER — Ambulatory Visit (INDEPENDENT_AMBULATORY_CARE_PROVIDER_SITE_OTHER): Payer: Medicare Other | Admitting: Internal Medicine

## 2023-05-26 ENCOUNTER — Encounter: Payer: Self-pay | Admitting: Internal Medicine

## 2023-05-26 VITALS — BP 110/70 | HR 63 | Ht 66.5 in | Wt 141.0 lb

## 2023-05-26 DIAGNOSIS — R6889 Other general symptoms and signs: Secondary | ICD-10-CM

## 2023-05-26 DIAGNOSIS — M810 Age-related osteoporosis without current pathological fracture: Secondary | ICD-10-CM | POA: Diagnosis not present

## 2023-05-26 DIAGNOSIS — R413 Other amnesia: Secondary | ICD-10-CM | POA: Diagnosis not present

## 2023-05-26 DIAGNOSIS — Z7185 Encounter for immunization safety counseling: Secondary | ICD-10-CM | POA: Diagnosis not present

## 2023-05-26 LAB — COMPLETE METABOLIC PANEL WITH GFR
AG Ratio: 2 (calc) (ref 1.0–2.5)
ALT: 13 U/L (ref 6–29)
AST: 22 U/L (ref 10–35)
Albumin: 4.5 g/dL (ref 3.6–5.1)
Alkaline phosphatase (APISO): 94 U/L (ref 37–153)
BUN/Creatinine Ratio: 13 (calc) (ref 6–22)
BUN: 13 mg/dL (ref 7–25)
CO2: 26 mmol/L (ref 20–32)
Calcium: 9.7 mg/dL (ref 8.6–10.4)
Chloride: 101 mmol/L (ref 98–110)
Creat: 1.04 mg/dL — ABNORMAL HIGH (ref 0.60–1.00)
Globulin: 2.3 g/dL (ref 1.9–3.7)
Glucose, Bld: 93 mg/dL (ref 65–99)
Potassium: 5.2 mmol/L (ref 3.5–5.3)
Sodium: 137 mmol/L (ref 135–146)
Total Bilirubin: 0.8 mg/dL (ref 0.2–1.2)
Total Protein: 6.8 g/dL (ref 6.1–8.1)
eGFR: 56 mL/min/{1.73_m2} — ABNORMAL LOW (ref >=60)

## 2023-05-26 LAB — CBC WITH DIFFERENTIAL/PLATELET
Absolute Lymphocytes: 741 {cells}/uL — ABNORMAL LOW (ref 850–3900)
Absolute Monocytes: 601 {cells}/uL (ref 200–950)
Basophils Absolute: 70 {cells}/uL (ref 0–200)
Basophils Relative: 0.9 %
Eosinophils Absolute: 328 {cells}/uL (ref 15–500)
Eosinophils Relative: 4.2 %
HCT: 40.8 % (ref 35.0–45.0)
Hemoglobin: 13.6 g/dL (ref 11.7–15.5)
MCH: 29.5 pg (ref 27.0–33.0)
MCHC: 33.3 g/dL (ref 32.0–36.0)
MCV: 88.5 fL (ref 80.0–100.0)
MPV: 9.5 fL (ref 7.5–12.5)
Monocytes Relative: 7.7 %
Neutro Abs: 6061 {cells}/uL (ref 1500–7800)
Neutrophils Relative %: 77.7 %
Platelets: 318 10*3/uL (ref 140–400)
RBC: 4.61 10*6/uL (ref 3.80–5.10)
RDW: 12 % (ref 11.0–15.0)
Total Lymphocyte: 9.5 %
WBC: 7.8 10*3/uL (ref 3.8–10.8)

## 2023-05-26 LAB — LIPID PANEL
Cholesterol: 240 mg/dL — ABNORMAL HIGH (ref <200)
HDL: 81 mg/dL (ref >=50)
LDL Cholesterol (Calc): 136 mg/dL — ABNORMAL HIGH
Non-HDL Cholesterol (Calc): 159 mg/dL — ABNORMAL HIGH (ref <130)
Total CHOL/HDL Ratio: 3 (calc) (ref <5.0)
Triglycerides: 124 mg/dL (ref <150)

## 2023-05-26 LAB — TSH: TSH: 3.76 m[IU]/L (ref 0.40–4.50)

## 2023-05-26 MED ORDER — LEVOTHYROXINE SODIUM 88 MCG PO TABS: 88.0000 ug | ORAL_TABLET | Freq: Every day | ORAL | 3 refills | Status: DC

## 2023-06-04 NOTE — Patient Instructions (Signed)
 Return in 6 weeks for follow up on thyroid replacement medication which has been adjusted. See Neurologist in the near future. Patient considering move to retirement facility in [place] to be near family.

## 2023-07-10 ENCOUNTER — Encounter: Payer: Self-pay | Admitting: Diagnostic Neuroimaging

## 2023-07-10 ENCOUNTER — Ambulatory Visit (INDEPENDENT_AMBULATORY_CARE_PROVIDER_SITE_OTHER): Payer: Medicare Other | Admitting: Diagnostic Neuroimaging

## 2023-07-10 VITALS — BP 137/86 | HR 68 | Ht 68.0 in | Wt 147.0 lb

## 2023-07-10 DIAGNOSIS — R413 Other amnesia: Secondary | ICD-10-CM

## 2023-07-10 MED ORDER — MEMANTINE HCL 10 MG PO TABS: 10.0000 mg | ORAL_TABLET | Freq: Two times a day (BID) | ORAL | 12 refills | Status: DC

## 2023-07-10 MED ORDER — DONEPEZIL HCL 10 MG PO TABS: 10.0000 mg | ORAL_TABLET | Freq: Every day | ORAL | 12 refills | Status: DC

## 2023-07-10 NOTE — Patient Instructions (Addendum)
  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

## 2023-07-10 NOTE — Progress Notes (Signed)
[facility]  PATIENT: Chelsea Cantrell DOB: [birth year]  REFERRING CLINICIAN: Margaree Mackintosh, MD HISTORY FROM: patient REASON FOR VISIT: follow up   HISTORICAL  CHIEF COMPLAINT:  Chief Complaint  Patient presents with   Memory Loss    Rm 7 with nieces Pt is well, reports her memory has worsen since last visit. She is more forgetful that she was. She has no issue with ADLs, locations or names of people she knows.     HISTORY OF PRESENT ILLNESS:   UPDATE (07/10/23, VRP): Since last visit, memory loss has progressed. Able to maintain ADLs, except driving more challenging. Planning to move to [place] to a retirement community. Neuropsych testing reviewed, and possibility of major neurocognitive disorder raised.   PRIOR HPI (01/25/22, VRP): 78 year old female here for evaluation of memory loss.  Patient notes some mild attention and memory loss issues, forgetfulness.  For example she had trouble getting to our office today, but mentioned that GPS did not recognize the correct street address.  When she uses her GPS she is able to get to any location without difficulty.  She only has problems when she does not use GPS and tries to drive to unfamiliar locations.  Has been having some increased stress issues.  She has been having to write things down more often.  Has some chronic tinnitus issues.  She also tends to be a worrier with some anxiety tendencies.   REVIEW OF SYSTEMS: Full 14 system review of systems performed and negative with exception of: as per HPI.  ALLERGIES: Allergies  Allergen Reactions   Other Other (See Comments)    Environmental allergies/gets allergy injections    HOME MEDICATIONS: Outpatient Medications Prior to Visit  Medication Sig Dispense Refill   Coenzyme Q10 (CO Q 10 PO) Take by mouth.     Digestive Enzymes CAPS Take by mouth.     DULoxetine (CYMBALTA) 30 MG capsule Take 30 mg by mouth 2 (two) times daily.     levothyroxine (SYNTHROID)  88 MCG tablet Take 1 tablet (88 mcg total) by mouth daily. 90 tablet 3   loratadine (CLARITIN) 10 MG tablet Take by mouth as needed.     Multiple Vitamin (MULTIVITAMIN) tablet Take 1 tablet by mouth daily.     Omega-3 Fatty Acids (FISH OIL) 1000 MG CAPS Take by mouth.     polyethylene glycol (MIRALAX / GLYCOLAX) packet Take 17 g by mouth 2 (two) times daily.     Probiotic Product (PROBIOTIC-10 PO) Take by mouth.     Spacer/Aero-Holding Chambers (OPTICHAMBER DIAMOND) MISC      SYMBICORT 80-4.5 MCG/ACT inhaler Inhale into the lungs.     VENTOLIN HFA 108 (90 BASE) MCG/ACT inhaler Inhale 2 puffs into the lungs every 6 (six) hours as needed for wheezing or shortness of breath.     donepezil (ARICEPT) 5 MG tablet Take 1 tablet (5 mg total) by mouth at bedtime. (Patient not taking: Reported on 07/10/2023) 30 tablet 2   EPINEPHrine 0.3 mg/0.3 mL IJ SOAJ injection INJECT INTRAMUSCULARLY AS DIRECTED (Patient not taking: Reported on 03/28/2023)     meloxicam (MOBIC) 15 MG tablet Take 1 tablet (15 mg total) by mouth daily. (Patient not taking: Reported on 03/28/2023) 30 tablet 2   No facility-administered medications prior to visit.    PAST MEDICAL HISTORY: Past Medical History:  Diagnosis Date   Allergy    Arthritis    Asthma    exercise induced   Colon polyps  Constipation    IBS (irritable bowel syndrome)    Osteoporosis 03/2015   T score -2.6 left forearm. Left femoral neck -1.4, AP spine 0.1   Pancreatitis    Schatzki's ring    Seasonal affective disorder (HCC)    Spinal stenosis    Thyroid disease    Hypo    PAST SURGICAL HISTORY: Past Surgical History:  Procedure Laterality Date   EUS N/A 06/12/2014   Procedure: UPPER ENDOSCOPIC ULTRASOUND (EUS) LINEAR;  Surgeon: Theda Belfast, MD;  Location: [facility];  Service: Endoscopy;  Laterality: N/A;   FOOT SURGERY Left    HIP SURGERY Right 2001   Replacement   JOINT REPLACEMENT     TONSILLECTOMY      FAMILY HISTORY: Family  History  Problem Relation Age of Onset   Stroke Mother    Osteoporosis Mother    Heart disease Father    Hyperlipidemia Father    Lung cancer Father     SOCIAL HISTORY: Social History   Socioeconomic History   Marital status: Single    Spouse name: Not on file   Number of children: Not on file   Years of education: Not on file   Highest education level: Not on file  Occupational History   Not on file  Tobacco Use   Smoking status: Former    Current packs/day: 0.00    Types: Cigarettes    Quit date: 05/23/1974    Years since quitting: 49.1   Smokeless tobacco: Never  Substance and Sexual Activity   Alcohol use: Yes    Alcohol/week: 3.0 standard drinks of alcohol    Types: 3 Glasses of wine per week   Drug use: No   Sexual activity: Not Currently    Birth control/protection: Post-menopausal    Comment: 1st intercourse 78 yo-More than 5 partners  Other Topics Concern   Not on file  Social History Narrative   Social history: Single, never married.  No children.  Retired as an Psychologist, educational for [employer].  Did smoke prior to 1976 but currently non-smoker.  She is a vegetarian.  She drinks wine.  Exercises routinely.       Family history: Father died at age 18 of lung cancer with history of MI at age 58.  Father also has hyperlipidemia.  Mother died at age 91 but had a stroke 4 years before she passed away.  1 brother and 1 sister in good health.       Social Drivers of Corporate investment banker Strain: Low Risk  (05/25/2023)   Overall Financial Resource Strain (CARDIA)    Difficulty of Paying Living Expenses: Not hard at all  Food Insecurity: No Food Insecurity (05/26/2023)   Hunger Vital Sign    Worried About Running Out of Food in the Last Year: Never true    Ran Out of Food in the Last Year: Never true  Transportation Needs: No Transportation Needs (05/26/2023)   PRAPARE - Administrator, Civil Service (Medical): No    Lack of Transportation (Non-Medical):  No  Physical Activity: Sufficiently Active (05/25/2023)   Exercise Vital Sign    Days of Exercise per Week: 3 days    Minutes of Exercise per Session: 60 min  Stress: No Stress Concern Present (05/25/2023)   [employer] of Occupational Health - Occupational Stress Questionnaire    Feeling of Stress : Only a little  Social Connections: Socially Isolated (05/25/2023)   Social Connection and Isolation Panel [NHANES]  Frequency of Communication with Friends and Family: More than three times a week    Frequency of Social Gatherings with Friends and Family: Twice a week    Attends Religious Services: Never    Database administrator or Organizations: No    Attends Banker Meetings: Never    Marital Status: Never married  Intimate Partner Violence: Not At Risk (05/26/2023)   Humiliation, Afraid, Rape, and Kick questionnaire    Fear of Current or Ex-Partner: No    Emotionally Abused: No    Physically Abused: No    Sexually Abused: No     PHYSICAL EXAM  GENERAL EXAM/CONSTITUTIONAL: Vitals:  Vitals:   07/10/23 1453  BP: 137/86  Pulse: 68  Weight: 147 lb (66.7 kg)  Height: 5\' 8"  (1.727 m)   Body mass index is 22.35 kg/m. Wt Readings from Last 3 Encounters:  07/10/23 147 lb (66.7 kg)  05/26/23 141 lb (64 kg)  03/28/23 146 lb (66.2 kg)   Patient is in no distress; well developed, nourished and groomed; neck is supple  CARDIOVASCULAR: Examination of carotid arteries is normal; no carotid bruits Regular rate and rhythm, no murmurs Examination of peripheral vascular system by observation and palpation is normal  EYES: Ophthalmoscopic exam of optic discs and posterior segments is normal; no papilledema or hemorrhages No results found.  MUSCULOSKELETAL: Gait, strength, tone, movements noted in Neurologic exam below  NEUROLOGIC: MENTAL STATUS:     01/25/2022   10:54 AM  MMSE - Mini Mental State Exam  Orientation to time 3  Orientation to Place 4  Registration 3   Attention/ Calculation 5  Recall 2  Language- name 2 objects 2  Language- repeat 1  Language- follow 3 step command 3  Language- read & follow direction 1  Write a sentence 1  Copy design 1  Total score 26      07/10/2023    2:56 PM  Montreal Cognitive Assessment   Visuospatial/ Executive (0/5) 2  Naming (0/3) 2  Attention: Read list of digits (0/2) 2  Attention: Read list of letters (0/1) 1  Attention: Serial 7 subtraction starting at 100 (0/3) 3  Language: Repeat phrase (0/2) 2  Language : Fluency (0/1) 0  Abstraction (0/2) 2  Delayed Recall (0/5) 0  Orientation (0/6) 5  Total 19   awake, alert, oriented to person, place and time recent and remote memory intact normal attention and concentration language fluent, comprehension intact, naming intact fund of knowledge appropriate  CRANIAL NERVE:  2nd - no papilledema on fundoscopic exam 2nd, 3rd, 4th, 6th - pupils equal and reactive to light, visual fields full to confrontation, extraocular muscles intact, no nystagmus 5th - facial sensation symmetric 7th - facial strength symmetric 8th - hearing intact 9th - palate elevates symmetrically, uvula midline 11th - shoulder shrug symmetric 12th - tongue protrusion midline  MOTOR:  normal bulk and tone, full strength in the BUE, BLE  SENSORY:  normal and symmetric to light touch, temperature, vibration  COORDINATION:  finger-nose-finger, fine finger movements normal  REFLEXES:  deep tendon reflexes present and symmetric  GAIT/STATION:  narrow based gait   DIAGNOSTIC DATA (LABS, IMAGING, TESTING) - I reviewed patient records, labs, notes, testing and imaging myself where available.  Lab Results  Component Value Date   WBC 7.8 05/25/2023   HGB 13.6 05/25/2023   HCT 40.8 05/25/2023   MCV 88.5 05/25/2023   PLT 318 05/25/2023      Component Value Date/Time  NA 137 05/25/2023 0922   K 5.2 05/25/2023 0922   CL 101 05/25/2023 0922   CO2 26 05/25/2023 0922    GLUCOSE 93 05/25/2023 0922   BUN 13 05/25/2023 0922   CREATININE 1.04 (H) 05/25/2023 0922   CALCIUM 9.7 05/25/2023 0922   PROT 6.8 05/25/2023 0922   ALBUMIN 4.2 04/25/2016 1000   AST 22 05/25/2023 0922   ALT 13 05/25/2023 0922   ALKPHOS 56 04/25/2016 1000   BILITOT 0.8 05/25/2023 0922   GFRNONAA 63 05/07/2020 0930   GFRAA 73 05/07/2020 0930   Lab Results  Component Value Date   CHOL 240 (H) 05/25/2023   HDL 81 05/25/2023   LDLCALC 136 (H) 05/25/2023   TRIG 124 05/25/2023   CHOLHDL 3.0 05/25/2023   No results found for: "HGBA1C" Lab Results  Component Value Date   VITAMINB12 1,267 (H) 12/16/2021   Lab Results  Component Value Date   TSH 3.76 05/25/2023    02/07/22 MRI brain (with and without) demonstrating: - Mild scattered chronic small vessel ischemic disease. - No acute findings.   ASSESSMENT AND PLAN  78 y.o. year old female here with:   Dx:  1. 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) - consider ATN panel - continue donepezil; may increase to 10mg  at bedtime - consider memantine 10mg  twice a day  - safety / supervision issues reviewed - daily physical activity / exercise (at least 15-30 minutes) - eat more plants / vegetables - increase social activities, brain stimulation, games, puzzles, hobbies, crafts, arts, music - aim for at least 7-8 hours sleep per night (or more) - caution with medications, finances, driving - safety / supervision issues reviewed - caregiver resources provided (including [email or web address])  Return for pending if symptoms worsen or fail to improve.    Suanne Marker, MD 07/10/2023, 3:55 PM Certified in Neurology, Neurophysiology and Neuroimaging  [facility] [address], [address] [place], [place] [address] [phone]

## 2023-07-11 ENCOUNTER — Other Ambulatory Visit: Payer: Medicare Other

## 2023-07-12 ENCOUNTER — Telehealth: Payer: Self-pay | Admitting: Internal Medicine

## 2023-07-12 NOTE — Telephone Encounter (Signed)
Called patient and no one answered  phone and I left a voice mail on the other phone, also sent Mychart message that I was canceling her lab appointment and we would reschedule for next week.

## 2023-07-13 ENCOUNTER — Other Ambulatory Visit: Payer: Medicare Other

## 2023-07-13 DIAGNOSIS — R6889 Other general symptoms and signs: Secondary | ICD-10-CM

## 2023-07-13 DIAGNOSIS — E039 Hypothyroidism, unspecified: Secondary | ICD-10-CM

## 2023-07-13 NOTE — Telephone Encounter (Signed)
Called back to reschedule lab appointment

## 2023-07-25 DIAGNOSIS — H401131 Primary open-angle glaucoma, bilateral, mild stage: Secondary | ICD-10-CM | POA: Diagnosis not present

## 2023-07-25 DIAGNOSIS — H2513 Age-related nuclear cataract, bilateral: Secondary | ICD-10-CM | POA: Diagnosis not present

## 2023-07-25 DIAGNOSIS — H43813 Vitreous degeneration, bilateral: Secondary | ICD-10-CM | POA: Diagnosis not present

## 2023-07-25 DIAGNOSIS — H1045 Other chronic allergic conjunctivitis: Secondary | ICD-10-CM | POA: Diagnosis not present

## 2023-07-25 DIAGNOSIS — H04123 Dry eye syndrome of bilateral lacrimal glands: Secondary | ICD-10-CM | POA: Diagnosis not present

## 2023-07-26 NOTE — Telephone Encounter (Signed)
 LVM to CB to schedule an appointment to come in and get TSH level checked

## 2023-07-28 ENCOUNTER — Other Ambulatory Visit

## 2023-07-28 DIAGNOSIS — E039 Hypothyroidism, unspecified: Secondary | ICD-10-CM | POA: Diagnosis not present

## 2023-07-28 DIAGNOSIS — R6889 Other general symptoms and signs: Secondary | ICD-10-CM

## 2023-07-29 LAB — TSH: TSH: 0.73 m[IU]/L (ref 0.40–4.50)

## 2023-08-07 ENCOUNTER — Encounter: Payer: Self-pay | Admitting: Internal Medicine

## 2023-08-07 NOTE — Progress Notes (Signed)
 Patient Care Team: Chelsea Mackintosh, MD as PCP - General (Internal Medicine)  Visit Date: 08/10/23  Subjective:   Chief Complaint  Patient presents with   Medication Management  Patient ZO:XWRU Cantrell, Chelsea Cantrell DOB:25-Dec-1945,78 y.o. EAV:[id number]   78 y.o. Female presents today for an office visit for discussion of meds and plans to move to retirement center in hometown of [place] in the near future. Patient has a past medical history of memory loss, hypothyroidism, musculoskeletal pain. She says that she wants to review her medications and  indications these meds were prescribed were  in light of her recent decision to move into [employer] in [place] where her son-in-law and niece live nearby, and she will move there in June. She takes Namenda 10 mg BID and Donepezil 10 mg nightly, prescribed by Dr. Joycelyn Cantrell, Neurologist for her memory loss.Has been evaluated by Neurospsychologist Dr. Arley Cantrell and dx with mild neurocognitive disorder due to Alzheimer's disease. See visit note he prepared March 22, 2023.Does not plan to be driving. Brain MRI 2023 showed scattered periventricular and subcortical foci of chronic small vessel ischemic disease described as mild.  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 Hx of allergic rhinitis and mild asthma seen by Allergist but never any severe episodes.Back pain was never an issue until 2024 and was evaluated by Orthopedist. Allergies to animal hair, dander and pollen.Lives with dogs. Having  dogs does not appear to be an issue with allergies and in fact , helps her mood and spirit.   She is worried that being on these meds may require an advanced level of care  within [facility], as she is wanting to live independently. She is accompanied by close friend, Chelsea Cantrell.Explained that facility will monitor her ability to live independently and make recommendations as needed.I think she can live independently with some light supervision such as meals and meds for the present time. She has been living independently here in [place] with visits from relatives who live out of town and with the help of good friends who stay in touch with her.   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 Cymbalta was suggested at that time. She has difficulty remembering why Cymbalta was prescribed. It was for back pain  and not not depression. She wants to live independently and I think she can in a supervised setting such as the one she is considering. She may need some help with keeping medications straight and administered correctly such as a daily tablet dispenser or medication box.   Past Medical History:  Diagnosis Date   Allergy    Arthritis    Asthma    exercise induced   Colon polyps    Constipation    IBS (irritable bowel syndrome)    Osteoporosis 03/2015   T score -2.6 left forearm. Left femoral neck -1.4, AP spine 0.1   Pancreatitis    Schatzki's ring    Seasonal affective disorder (HCC)    Spinal stenosis    Thyroid disease    Hypo    Allergies  Allergen Reactions   Other Other (See Comments)    Environmental allergies/gets allergy injections  Family History  Problem Relation Age of Onset   Stroke Mother    Osteoporosis Mother    Heart disease Father    Hyperlipidemia Father    Lung cancer Father    Social History   Social History Narrative   Social history: Single, never married.  No children.  Retired as an Psychologist, educational for [employer].  Did smoke prior to 1976 but currently non-smoker.  She is a vegetarian.  She drinks wine.  Exercises  routinely.       Family history: Father died at age 58 of lung cancer with history of MI at age 64.  Father also has hyperlipidemia.  Mother died at age 56 but had a stroke 4 years before she passed away.  1 brother and 1 sister in good health.       ROS   Objective:  Vitals: BP 132/80   Pulse 78   Temp 98.2 F (36.8 C)   SpO2 98%   Weight in January 141 pounds Ht:5 ft 6.5 in  Physical Exam   Skin warm and dry, No thyromegaly, Chest clear. Affect is pleasant and cooperative. Converses well. No speech issues. Cor:RRR Ext without edema. Results:  Studies Obtained And Personally Reviewed By Me:  Imaging, colonoscopy, mammogram, bone density scan, echocardiogram, heart cath, stress test, CT calcium score, etc.   Labs:     Component Value Date/Time   NA 137 05/25/2023 0922   K 5.2 05/25/2023 0922   CL 101 05/25/2023 0922   CO2 26 05/25/2023 0922   GLUCOSE 93 05/25/2023 0922   BUN 13 05/25/2023 0922   CREATININE 1.04 (H) 05/25/2023 0922   CALCIUM 9.7 05/25/2023 0922   PROT 6.8 05/25/2023 0922   ALBUMIN 4.2 04/25/2016 1000   AST 22 05/25/2023 0922   ALT 13 05/25/2023 0922   ALKPHOS 56 04/25/2016 1000   BILITOT 0.8 05/25/2023 0922   GFRNONAA 63 05/07/2020 0930   GFRAA 73 05/07/2020 0930    Lab Results  Component Value Date   WBC 7.8 05/25/2023   HGB 13.6 05/25/2023   HCT 40.8 05/25/2023   MCV 88.5 05/25/2023   PLT 318 05/25/2023   Lab Results  Component Value Date   CHOL 240 (H) 05/25/2023   HDL 81 05/25/2023   LDLCALC 136 (H) 05/25/2023   TRIG 124 05/25/2023   CHOLHDL 3.0 05/25/2023   No results found for: "HGBA1C"  Lab Results  Component Value Date   TSH 0.73 07/28/2023      Assessment & Plan:   Alzheimer's disease with mild memory loss currently on Namenda and Aricept. Has had extensive Neurology evaluation and testing. Followed by Neurology. Tested by Neuropsychologist. Likely able to live independently with some light assistance and monitoring med  adherence. No driving.  Chronic low back pain evaluated by [facility]- Imaging discovered Tarlov cyst with lumbosacral impingement treated with epidural steroid injections by Ortho. Back pain is currently stable. Takes Meloxicam daily. MRI has shown severe lumbosacral disc disease but she appears  agile and pain free today.  Remote hx of pancreatitis- no recent episodes in years. Has taken pancrease for years recommended by Gastroenterologist.See details in Medicare Wellness/Annual Hx and PE dictation  Hypothyroidism treated with thyroid replacement medication daily   Time spent with patient and her good friend as well as extensive chart review is 45 minutes.   I,Chelsea Cantrell,acting as a Neurosurgeon for Chelsea Mackintosh, MD.,have documented all relevant documentation on the behalf of Chelsea Mackintosh, MD,as directed by  Chelsea Mackintosh, MD while in the presence of Chelsea Mackintosh, MD.  I, Chelsea Mackintosh, MD, have reviewed all documentation for this visit. The documentation on 08/10/23 for the exam, diagnosis, procedures, and orders are all accurate and complete.

## 2023-08-10 ENCOUNTER — Encounter: Payer: Self-pay | Admitting: Internal Medicine

## 2023-08-10 ENCOUNTER — Ambulatory Visit (INDEPENDENT_AMBULATORY_CARE_PROVIDER_SITE_OTHER): Admitting: Internal Medicine

## 2023-08-10 VITALS — BP 132/80 | HR 78 | Temp 98.2°F

## 2023-08-10 DIAGNOSIS — R6889 Other general symptoms and signs: Secondary | ICD-10-CM | POA: Diagnosis not present

## 2023-08-10 DIAGNOSIS — G96191 Perineural cyst: Secondary | ICD-10-CM | POA: Diagnosis not present

## 2023-08-10 DIAGNOSIS — M810 Age-related osteoporosis without current pathological fracture: Secondary | ICD-10-CM

## 2023-08-10 DIAGNOSIS — R413 Other amnesia: Secondary | ICD-10-CM

## 2023-08-10 MED ORDER — MELOXICAM 15 MG PO TABS: 15.0000 mg | ORAL_TABLET | Freq: Every day | ORAL | 0 refills | Status: DC

## 2023-08-10 NOTE — Patient Instructions (Addendum)
 Patient here today for discussion and review of medications.  Plans to move to [place] to be closer to family and will be in a retirement center initially residing alone in a cottage.  Will need some assistance checking on compliance with medications.  Has been diagnosed with Alzheimer's disease by Neurology and Neuropsychology here in [place].  She is on Namenda and  Donepezil for memory issues. No longer drives.  Has family living in [place].  There is lumbosacral back pain with large Tarlov cyst with impingement and sacral nerve roots.  This lumbosacral pain is why she takes Cymbalta and this was explained to her today.  It has not been prescribed for depression.

## 2023-08-11 ENCOUNTER — Telehealth: Payer: Self-pay | Admitting: Internal Medicine

## 2023-08-11 NOTE — Telephone Encounter (Signed)
 When Chelsea Cantrell was in for her Office Visit on 08/10/2023 she let Dr Lenord Fellers know she would be moving in the near future and that the below doctor would be taking over her care.  Dr Renaldo Harrison [address] [place], [place] [address] [phone]

## 2023-08-23 ENCOUNTER — Ambulatory Visit (HOSPITAL_COMMUNITY)
Admission: EM | Admit: 2023-08-23 | Discharge: 2023-08-23 | Disposition: A | Discharge status: Home / Self Care | Attending: Emergency Medicine | Admitting: Emergency Medicine

## 2023-08-23 ENCOUNTER — Encounter (HOSPITAL_COMMUNITY): Payer: Self-pay

## 2023-08-23 ENCOUNTER — Ambulatory Visit (INDEPENDENT_AMBULATORY_CARE_PROVIDER_SITE_OTHER)

## 2023-08-23 DIAGNOSIS — M25431 Effusion, right wrist: Secondary | ICD-10-CM

## 2023-08-23 DIAGNOSIS — M1811 Unilateral primary osteoarthritis of first carpometacarpal joint, right hand: Secondary | ICD-10-CM | POA: Diagnosis not present

## 2023-08-23 DIAGNOSIS — M25531 Pain in right wrist: Secondary | ICD-10-CM

## 2023-08-23 DIAGNOSIS — S52501A Unspecified fracture of the lower end of right radius, initial encounter for closed fracture: Secondary | ICD-10-CM | POA: Diagnosis not present

## 2023-08-23 DIAGNOSIS — M79641 Pain in right hand: Secondary | ICD-10-CM

## 2023-08-23 DIAGNOSIS — S52531A Colles' fracture of right radius, initial encounter for closed fracture: Secondary | ICD-10-CM

## 2023-08-23 MED ORDER — HYDROCODONE-ACETAMINOPHEN 5-325 MG PO TABS: 1.0000 | ORAL_TABLET | ORAL | 0 refills | Status: AC | PRN

## 2023-08-23 NOTE — ED Provider Notes (Addendum)
 [facility]    CSN: [id number] Arrival date & time: 08/23/23  0845      History   Chief Complaint Chief Complaint  Patient presents with   Hand Pain    HPI Chelsea Cantrell is a 78 y.o. female.   Patient presents to clinic over concerns of right hand and wrist pain and swelling.  She had a mechanical fall where she slipped and fell, caught herself on her right hand.  Immediately afterwards she had pain around the base of her thumb and distal radial wrist.  She slept with her hand elevated.  This morning she woke up and had extensive swelling and bruising.  She took a Tylenol prior to arrival.  The history is provided by the patient and medical records.  Hand Pain    Past Medical History:  Diagnosis Date   Allergy    Arthritis    Asthma    exercise induced   Colon polyps    Constipation    IBS (irritable bowel syndrome)    Osteoporosis 03/2015   T score -2.6 left forearm. Left femoral neck -1.4, AP spine 0.1   Pancreatitis    Schatzki's ring    Seasonal affective disorder (HCC)    Spinal stenosis    Thyroid disease    Hypo    Patient Active Problem List   Diagnosis Date Noted   Memory loss 03/31/2023   Allergic rhinitis due to animal (cat) (dog) hair and dander 05/07/2020   Allergic rhinitis due to pollen 05/07/2020   Chronic allergic conjunctivitis 05/07/2020   Exacerbation of intermittent asthma 05/07/2020   Mild intermittent asthma 05/07/2020   Mild persistent asthma, uncomplicated 05/07/2020   Pain in right hand 05/01/2018   Chronic idiopathic constipation 06/16/2017   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   Elevated LFTs 10/06/2014   Acute pancreatitis 09/20/2014   Pancreatitis 06/09/2014   Abdominal pain 06/09/2014   Transaminitis 06/09/2014   UTI (urinary tract infection) 06/09/2014   Epigastric pain    Pain in limb 12/27/2012   Varicose veins of bilateral lower extremities with other complications 11/08/2012   IBS (irritable bowel  syndrome)    Hyperplastic colon polyp 03/12/2012   GE reflux 03/27/2011   Osteoarthritis 03/27/2011   Exercise-induced asthma 03/27/2011   Allergic rhinitis 03/27/2011   Hypothyroidism 03/27/2011   Spinal stenosis 03/27/2011   Seasonal affective disorder (HCC) 03/27/2011    Past Surgical History:  Procedure Laterality Date   EUS N/A 06/12/2014   Procedure: UPPER ENDOSCOPIC ULTRASOUND (EUS) LINEAR;  Surgeon: Theda Belfast, MD;  Location: [facility];  Service: Endoscopy;  Laterality: N/A;   FOOT SURGERY Left    HIP SURGERY Right 2001   Replacement   JOINT REPLACEMENT     TONSILLECTOMY      OB History     Gravida  1   Para      Term      Preterm      AB  1   Living  0      SAB      IAB      Ectopic      Multiple      Live Births               Home Medications    Prior to Admission medications   Medication Sig Start Date End Date Taking? Authorizing Provider  Coenzyme Q10 (CO Q 10 PO) Take by mouth.   Yes [provider]  Digestive Enzymes  CAPS Take by mouth.   Yes [provider]  donepezil (ARICEPT) 10 MG tablet Take 1 tablet (10 mg total) by mouth at bedtime. 07/10/23  Yes Penumalli, Glenford Bayley, MD  DULoxetine (CYMBALTA) 30 MG capsule Take 30 mg by mouth 2 (two) times daily.   Yes [provider]  HYDROcodone-acetaminophen (NORCO/VICODIN) 5-325 MG tablet Take 1-2 tablets by mouth every 4 (four) hours as needed for up to 5 days for moderate pain (pain score 4-6) or severe pain (pain score 7-10). 08/23/23 08/28/23 Yes Rinaldo Ratel, [place] N, FNP  levothyroxine (SYNTHROID) 88 MCG tablet Take 1 tablet (88 mcg total) by mouth daily. 05/26/23  Yes Baxley, Luanna Cole, MD  meloxicam (MOBIC) 15 MG tablet Take 1 tablet (15 mg total) by mouth daily. As needed for musculoskeletal pain. Take with food. 08/10/23  Yes Baxley, Luanna Cole, MD  memantine (NAMENDA) 10 MG tablet Take 1 tablet (10 mg total) by mouth 2 (two) times daily. 07/10/23  Yes Penumalli,  Glenford Bayley, MD  Multiple Vitamin (MULTIVITAMIN) tablet Take 1 tablet by mouth daily.   Yes [provider]  Omega-3 Fatty Acids (FISH OIL) 1000 MG CAPS Take by mouth.   Yes [provider]  polyethylene glycol (MIRALAX / GLYCOLAX) packet Take 17 g by mouth 2 (two) times daily.    [provider]    Family History Family History  Problem Relation Age of Onset   Stroke Mother    Osteoporosis Mother    Heart disease Father    Hyperlipidemia Father    Lung cancer Father     Social History Social History   Tobacco Use   Smoking status: Former    Current packs/day: 0.00    Types: Cigarettes    Quit date: 05/23/1974    Years since quitting: 49.2   Smokeless tobacco: Never  Substance Use Topics   Alcohol use: Yes    Alcohol/week: 3.0 standard drinks of alcohol    Types: 3 Glasses of wine per week   Drug use: No     Allergies   Other   Review of Systems Review of Systems  Per HPI  Physical Exam Triage Vital Signs ED Triage Vitals [08/23/23 0906]  Encounter Vitals Group     BP 135/83     Systolic BP Percentile      Diastolic BP Percentile      Pulse Rate 64     Resp 18     Temp 98.6 F (37 C)     Temp Source Oral     SpO2 98 %     Weight      Height      Head Circumference      Peak Flow      Pain Score      Pain Loc      Pain Education      Exclude from Growth Chart    No data found.  Updated Vital Signs BP 135/83 (BP Location: Left Arm)   Pulse 64   Temp 98.6 F (37 C) (Oral)   Resp 18   SpO2 98%   Visual Acuity Right Eye Distance:   Left Eye Distance:   Bilateral Distance:    Right Eye Near:   Left Eye Near:    Bilateral Near:     Physical Exam Vitals and nursing note reviewed.  Constitutional:      Appearance: Normal appearance.  HENT:     Head: Normocephalic and atraumatic.     Right Ear:  External ear normal.     Left Ear: External ear normal.     Nose: Nose normal.     Mouth/Throat:     Mouth: Mucous  membranes are moist.  Eyes:     General: No scleral icterus. Cardiovascular:     Rate and Rhythm: Normal rate.  Pulmonary:     Effort: Pulmonary effort is normal. No respiratory distress.  Musculoskeletal:        General: Swelling, tenderness and signs of injury present.     Right wrist: Swelling and tenderness present.     Right hand: Swelling and tenderness present. Normal capillary refill. Normal pulse.  Skin:    General: Skin is warm and dry.     Capillary Refill: Capillary refill takes less than 2 seconds.     Findings: Bruising present.  Neurological:     General: No focal deficit present.     Mental Status: She is alert.  Psychiatric:        Mood and Affect: Mood normal.        Behavior: Behavior is cooperative.      UC Treatments / Results  Labs (all labs ordered are listed, but only abnormal results are displayed) Labs Reviewed - No data to display  EKG   Radiology No results found.  Procedures Procedures (including critical care time)  Medications Ordered in UC Medications - No data to display  Initial Impression / Assessment and Plan / UC Course  I have reviewed the triage vital signs and the nursing notes.  Pertinent labs & imaging results that were available during my care of the patient were reviewed by me and considered in my medical decision making (see chart for details).  Vitals and triage reviewed, patient is hemodynamically stable.  Right radial wrist with obvious swelling and mild deformity extending into the base of the right thumb.  Brisk capillary refill distally, radial pulse 2+, significant bruising.  Imaging by my interpretation shows Colles' fracture of the distal right radius with mild deformity.  Arthritis as well.  Earney Hamburg, orthopedic PA on-call consulted who is agreeable to plan of splinting and follow-up with Dr. Frazier Butt w/ [facility].  Plan of care, follow-up care and pain management discussed.  Strict emergency precautions  given if symptoms evolve.  No questions at this time.  Radiology interpretation showed moderately displaced distal right radial fracture, no updates in treatment plan at this time.      Final Clinical Impressions(s) / UC Diagnoses   Final diagnoses:  Right hand pain  Pain and swelling of right wrist  Closed Colles' fracture of right radius, initial encounter     Discharge Instructions      Keep your splint clean and dry.  You can continue to elevate throughout the rest of today and tonight to help with swelling.  Keep the splint clean and dry.  You can take the Norco every 6 hours as needed for any breakthrough pain and Tylenol 500 mg as needed.  Do not exceed 4,000 total miligrams of Tylenol in 24 hours (Norco has 325 mg of Tylenol in each pill).  The pain medicine I am sending has a narcotic in it, do not drink alcohol or drive on this medication as it may cause sedation or drowsiness.  It also may cause constipation.  Call the orthopedic office to schedule follow-up appointment early next week.  Seek immediate care for any numbness, extreme pain, or any new concerning symptoms.      ED Prescriptions  Medication Sig Dispense Auth. Provider   HYDROcodone-acetaminophen (NORCO/VICODIN) 5-325 MG tablet Take 1-2 tablets by mouth every 4 (four) hours as needed for up to 5 days for moderate pain (pain score 4-6) or severe pain (pain score 7-10). 15 tablet Katherene Dinino, [place] N, [place]      I have reviewed the PDMP during this encounter.   Shyloh Derosa, [place] N, FNP 08/23/23 1018    Faria Casella, [place], [place] 08/23/23 1046

## 2023-08-23 NOTE — Progress Notes (Signed)
 Orthopedic Tech Progress Note Patient Details:  Tamelia Michalowski [facility] 05-19-1946 [id number]  Ortho Devices Type of Ortho Device: Arm sling, Sugartong splint Ortho Device/Splint Location: Right arm Ortho Device/Splint Interventions: Application   Post Interventions Patient Tolerated: Well  Genelle Bal Keshauna Degraffenreid 08/23/2023, 10:42 AM

## 2023-08-23 NOTE — Discharge Instructions (Addendum)
 Keep your splint clean and dry.  You can continue to elevate throughout the rest of today and tonight to help with swelling.  Keep the splint clean and dry.  You can take the Norco every 6 hours as needed for any breakthrough pain and Tylenol 500 mg as needed.  Do not exceed 4,000 total miligrams of Tylenol in 24 hours (Norco has 325 mg of Tylenol in each pill).  The pain medicine I am sending has a narcotic in it, do not drink alcohol or drive on this medication as it may cause sedation or drowsiness.  It also may cause constipation.  Call the orthopedic office to schedule follow-up appointment early next week.  Seek immediate care for any numbness, extreme pain, or any new concerning symptoms.

## 2023-08-23 NOTE — ED Triage Notes (Signed)
 Patient reports she had a slip and fall last night. Patient fell on her right hand.  NO LOC.

## 2023-08-25 ENCOUNTER — Telehealth: Payer: Self-pay | Admitting: Internal Medicine

## 2023-08-25 NOTE — Telephone Encounter (Signed)
-------  Fax Transmission Report-------  To:               Recipient at [phone] Subject:          Fw: Hp Scans Result:           The transmission was successful. Explanation:      All Pages Ok Pages Sent:       9 Connect Time:     4 minutes, 30 seconds Transmit Time:    08/25/2023 11:16 Transfer Rate:    14400 Status Code:      0000 Retry Count:      1 Job Id:           [id number] Unique Id:        [id number] Fax Line:         55 Fax Server:       [id number]

## 2023-08-25 NOTE — Telephone Encounter (Signed)
 Faxed a Physicians Health and Physical Report to Netta Corrigan at [facility] fax [phone]  Sent Physicians Health and Physical Report form Demographics Office visit notes from 08/10/2023 Medication List

## 2023-08-26 ENCOUNTER — Encounter (HOSPITAL_COMMUNITY): Payer: Self-pay

## 2023-08-26 ENCOUNTER — Ambulatory Visit (HOSPITAL_COMMUNITY)
Admission: EM | Admit: 2023-08-26 | Discharge: 2023-08-26 | Disposition: A | Discharge status: Home / Self Care | Attending: Family Medicine | Admitting: Family Medicine

## 2023-08-26 DIAGNOSIS — S52531D Colles' fracture of right radius, subsequent encounter for closed fracture with routine healing: Secondary | ICD-10-CM | POA: Diagnosis not present

## 2023-08-26 NOTE — ED Notes (Signed)
 Ortho tech at bedside

## 2023-08-26 NOTE — Discharge Instructions (Addendum)
 Keep your orthopaedic surgery appointment this week.

## 2023-08-26 NOTE — ED Triage Notes (Signed)
 Patient was here 4/2 for a fall and radial fracture. Patient's family member carrying splinting material to room. Patient states "it's not any better so he thought we should come back." Patient denies any increasing pain or swelling. States it is the same since she was last here.

## 2023-08-26 NOTE — ED Notes (Signed)
 Ortho Tech called for sugar tong splint per Dr. Tracie Harrier.

## 2023-08-28 NOTE — ED Provider Notes (Signed)
  [facility]   [id number] 08/26/23 Arrival Time: 1555  ASSESSMENT & PLAN:  1. Closed Colles' fracture of right radius with routine healing, subsequent encounter    Seen here on 08/23/23 for R distal radius fracture.  X-rays reviewed by me.  Has removed split; feeling it was too tight or swelling had worsened. Denies extremity sensation changes. Normal distal sensation and capillary refill observed; significant bruising to dorsal hand. Pain controlled with hydrocodone; needing less now; mainly to sleep.  Orthopaedic tech here to re-apply ulnar gutter. Has orthopaedic follow up appt with Dr Frazier Butt on 08/29/23. Reminded her of this.  May return here as needed,   Mardella Layman, MD 08/28/23 1249

## 2023-08-29 ENCOUNTER — Encounter (HOSPITAL_COMMUNITY): Payer: Self-pay | Admitting: Orthopedic Surgery

## 2023-08-29 ENCOUNTER — Other Ambulatory Visit: Payer: Self-pay

## 2023-08-29 DIAGNOSIS — S52501A Unspecified fracture of the lower end of right radius, initial encounter for closed fracture: Secondary | ICD-10-CM | POA: Diagnosis not present

## 2023-08-29 NOTE — Progress Notes (Signed)
 SDW CALL  Patient was given pre-op instructions over the phone. The opportunity was given for the patient to ask questions. No further questions asked. Patient verbalized understanding of instructions given.   PCP - Dr. Eden Emms Baxley Cardiologist - denies  PPM/ICD - denies Device Orders - n/a Rep Notified -  n/a  Chest x-ray - denies EKG - denies Stress Test - denies ECHO - denies Cardiac Cath - denies  Sleep Study - denies  No DM  Last dose of GLP1 agonist-  n/a GLP1 instructions:  n/a  Blood Thinner Instructions: n/a Aspirin Instructions:  n/a  ERAS Protcol - clears until 1130 PRE-SURGERY Ensure or G2- n/a  COVID TEST- n/a   Anesthesia review: no  Patient denies shortness of breath, fever, cough and chest pain over the phone call   All instructions explained to the patient, with a verbal understanding of the material. Patient agrees to go over the instructions while at home for a better understanding.    Teach back method used to confirm arrival time.

## 2023-08-30 ENCOUNTER — Ambulatory Visit (HOSPITAL_COMMUNITY)

## 2023-08-30 ENCOUNTER — Ambulatory Visit (HOSPITAL_COMMUNITY)
Admission: RE | Admit: 2023-08-30 | Discharge: 2023-08-30 | Disposition: A | Discharge status: Home / Self Care | Attending: Orthopedic Surgery | Admitting: Orthopedic Surgery

## 2023-08-30 ENCOUNTER — Encounter (HOSPITAL_COMMUNITY): Payer: Self-pay | Admitting: Orthopedic Surgery

## 2023-08-30 ENCOUNTER — Encounter (HOSPITAL_COMMUNITY)
Admission: RE | Disposition: A | Discharge status: Home / Self Care | Payer: Self-pay | Source: Home / Self Care | Attending: Orthopedic Surgery

## 2023-08-30 DIAGNOSIS — S52501A Unspecified fracture of the lower end of right radius, initial encounter for closed fracture: Secondary | ICD-10-CM | POA: Diagnosis not present

## 2023-08-30 DIAGNOSIS — J45909 Unspecified asthma, uncomplicated: Secondary | ICD-10-CM

## 2023-08-30 DIAGNOSIS — Z87891 Personal history of nicotine dependence: Secondary | ICD-10-CM

## 2023-08-30 DIAGNOSIS — E039 Hypothyroidism, unspecified: Secondary | ICD-10-CM

## 2023-08-30 DIAGNOSIS — M1990 Unspecified osteoarthritis, unspecified site: Secondary | ICD-10-CM | POA: Insufficient documentation

## 2023-08-30 DIAGNOSIS — W1830XA Fall on same level, unspecified, initial encounter: Secondary | ICD-10-CM | POA: Insufficient documentation

## 2023-08-30 DIAGNOSIS — F0390 Unspecified dementia without behavioral disturbance: Secondary | ICD-10-CM | POA: Insufficient documentation

## 2023-08-30 DIAGNOSIS — S52571A Other intraarticular fracture of lower end of right radius, initial encounter for closed fracture: Secondary | ICD-10-CM | POA: Diagnosis not present

## 2023-08-30 DIAGNOSIS — F32A Depression, unspecified: Secondary | ICD-10-CM | POA: Insufficient documentation

## 2023-08-30 DIAGNOSIS — K219 Gastro-esophageal reflux disease without esophagitis: Secondary | ICD-10-CM | POA: Diagnosis not present

## 2023-08-30 DIAGNOSIS — S52551A Other extraarticular fracture of lower end of right radius, initial encounter for closed fracture: Secondary | ICD-10-CM | POA: Diagnosis not present

## 2023-08-30 HISTORY — PX: OPEN REDUCTION INTERNAL FIXATION (ORIF) DISTAL RADIAL FRACTURE: SHX5989

## 2023-08-30 LAB — CBC
HCT: 37.7 % (ref 36.0–46.0)
Hemoglobin: 12.4 g/dL (ref 12.0–15.0)
MCH: 29.6 pg (ref 26.0–34.0)
MCHC: 32.9 g/dL (ref 30.0–36.0)
MCV: 90 fL (ref 80.0–100.0)
Platelets: 322 10*3/uL (ref 150–400)
RBC: 4.19 MIL/uL (ref 3.87–5.11)
RDW: 12.1 % (ref 11.5–15.5)
WBC: 5 10*3/uL (ref 4.0–10.5)
nRBC: 0 % (ref 0.0–0.2)

## 2023-08-30 SURGERY — OPEN REDUCTION INTERNAL FIXATION (ORIF) DISTAL RADIUS FRACTURE
Anesthesia: Monitor Anesthesia Care | Site: Wrist | Laterality: Right

## 2023-08-30 MED ORDER — ORAL CARE MOUTH RINSE: 15.0000 mL | Freq: Once | OROMUCOSAL | Status: AC

## 2023-08-30 MED ORDER — FENTANYL CITRATE (PF) 100 MCG/2ML IJ SOLN: 25.0000 ug | INTRAMUSCULAR | Status: DC | PRN

## 2023-08-30 MED ORDER — OXYCODONE HCL 5 MG/5ML PO SOLN: 5.0000 mg | Freq: Once | ORAL | Status: DC | PRN

## 2023-08-30 MED ORDER — CHLORHEXIDINE GLUCONATE 0.12 % MT SOLN
OROMUCOSAL | Status: AC
  Administered 2023-08-30: 15 mL via OROMUCOSAL
  Filled 2023-08-30: qty 15

## 2023-08-30 MED ORDER — PROPOFOL 500 MG/50ML IV EMUL
INTRAVENOUS | Status: DC | PRN
  Administered 2023-08-30: 125 ug/kg/min via INTRAVENOUS

## 2023-08-30 MED ORDER — CEFAZOLIN SODIUM-DEXTROSE 2-4 GM/100ML-% IV SOLN
2.0000 g | INTRAVENOUS | Status: AC
  Administered 2023-08-30: 2 g via INTRAVENOUS

## 2023-08-30 MED ORDER — ONDANSETRON HCL 4 MG/2ML IJ SOLN: 4.0000 mg | Freq: Four times a day (QID) | INTRAMUSCULAR | Status: DC | PRN

## 2023-08-30 MED ORDER — FENTANYL CITRATE (PF) 100 MCG/2ML IJ SOLN
INTRAMUSCULAR | Status: AC
  Administered 2023-08-30: 50 ug via INTRAVENOUS
  Filled 2023-08-30: qty 2

## 2023-08-30 MED ORDER — OXYCODONE HCL 5 MG PO TABS: 5.0000 mg | ORAL_TABLET | Freq: Once | ORAL | Status: DC | PRN

## 2023-08-30 MED ORDER — CEFAZOLIN SODIUM-DEXTROSE 2-4 GM/100ML-% IV SOLN: 2.0000 g | INTRAVENOUS | Status: DC

## 2023-08-30 MED ORDER — CEFAZOLIN SODIUM-DEXTROSE 2-4 GM/100ML-% IV SOLN
INTRAVENOUS | Status: AC
Start: 2023-08-30 — End: 2023-08-30
  Filled 2023-08-30: qty 100

## 2023-08-30 MED ORDER — PHENYLEPHRINE HCL-NACL 20-0.9 MG/250ML-% IV SOLN
INTRAVENOUS | Status: DC | PRN
  Administered 2023-08-30: 25 ug/min via INTRAVENOUS

## 2023-08-30 MED ORDER — PROPOFOL 10 MG/ML IV BOLUS
INTRAVENOUS | Status: DC | PRN
  Administered 2023-08-30 (×2): 20 mg via INTRAVENOUS

## 2023-08-30 MED ORDER — BUPIVACAINE HCL (PF) 0.5 % IJ SOLN
INTRAMUSCULAR | Status: DC | PRN
  Administered 2023-08-30: 25 mL via PERINEURAL

## 2023-08-30 MED ORDER — FENTANYL CITRATE (PF) 100 MCG/2ML IJ SOLN: 50.0000 ug | Freq: Once | INTRAMUSCULAR | Status: AC

## 2023-08-30 MED ORDER — PHENYLEPHRINE HCL (PRESSORS) 10 MG/ML IV SOLN
INTRAVENOUS | Status: DC | PRN
  Administered 2023-08-30: 160 ug via INTRAVENOUS
  Administered 2023-08-30: 80 ug via INTRAVENOUS

## 2023-08-30 MED ORDER — OXYCODONE HCL 5 MG PO TABS: 5.0000 mg | ORAL_TABLET | Freq: Four times a day (QID) | ORAL | 0 refills | Status: AC | PRN

## 2023-08-30 MED ORDER — CHLORHEXIDINE GLUCONATE 0.12 % MT SOLN: 15.0000 mL | Freq: Once | OROMUCOSAL | Status: AC

## 2023-08-30 MED ORDER — ONDANSETRON HCL 4 MG/2ML IJ SOLN
INTRAMUSCULAR | Status: DC | PRN
  Administered 2023-08-30: 4 mg via INTRAVENOUS

## 2023-08-30 MED ORDER — LACTATED RINGERS IV SOLN: INTRAVENOUS | Status: DC

## 2023-08-30 SURGICAL SUPPLY — 60 items
BAG COUNTER SPONGE SURGICOUNT (BAG) ×1 IMPLANT
BIT DRILL SOLID 2.0X40MM (BIT) IMPLANT
BIT DRILL SOLID 2.5X40MM (BIT) IMPLANT
BLADE CLIPPER SURG (BLADE) IMPLANT
BNDG ELASTIC 3INX 5YD STR LF (GAUZE/BANDAGES/DRESSINGS) ×3 IMPLANT
BNDG ELASTIC 4X5.8 VLCR STR LF (GAUZE/BANDAGES/DRESSINGS) ×1 IMPLANT
BNDG ESMARK 4X9 LF (GAUZE/BANDAGES/DRESSINGS) ×1 IMPLANT
BNDG GAUZE DERMACEA FLUFF 4 (GAUZE/BANDAGES/DRESSINGS) ×3 IMPLANT
CORD BIPOLAR FORCEPS 12FT (ELECTRODE) ×1 IMPLANT
COVER SURGICAL LIGHT HANDLE (MISCELLANEOUS) ×1 IMPLANT
CUFF TOURN SGL QUICK 18X4 (TOURNIQUET CUFF) ×1 IMPLANT
CUFF TRNQT CYL 24X4X16.5-23 (TOURNIQUET CUFF) IMPLANT
DRAIN TLS ROUND 10FR (DRAIN) IMPLANT
DRAPE OEC MINIVIEW 54X84 (DRAPES) IMPLANT
DRAPE U-SHAPE 47X51 STRL (DRAPES) ×1 IMPLANT
DRILL SOLID 2.0X40MM (BIT) ×1 IMPLANT
DRILL SOLID 2.5X40MM (BIT) ×1 IMPLANT
DRSG ADAPTIC 3X8 NADH LF (GAUZE/BANDAGES/DRESSINGS) ×1 IMPLANT
GAUZE SPONGE 4X4 12PLY STRL (GAUZE/BANDAGES/DRESSINGS) ×1 IMPLANT
GAUZE XEROFORM 5X9 LF (GAUZE/BANDAGES/DRESSINGS) ×1 IMPLANT
GLOVE BIO SURGEON STRL SZ7 (GLOVE) ×1 IMPLANT
GLOVE BIOGEL PI IND STRL 7.0 (GLOVE) ×1 IMPLANT
GOWN STRL REUS W/ TWL LRG LVL3 (GOWN DISPOSABLE) ×1 IMPLANT
GUIDE AIMING 1.5MM (WIRE) IMPLANT
KIT BASIN OR (CUSTOM PROCEDURE TRAY) ×1 IMPLANT
KIT TURNOVER KIT B (KITS) ×1 IMPLANT
MANIFOLD NEPTUNE II (INSTRUMENTS) ×1 IMPLANT
NDL 22X1.5 STRL (OR ONLY) (MISCELLANEOUS) IMPLANT
NEEDLE 22X1.5 STRL (OR ONLY) (MISCELLANEOUS) IMPLANT
NS IRRIG 1000ML POUR BTL (IV SOLUTION) ×1 IMPLANT
PACK ORTHO EXTREMITY (CUSTOM PROCEDURE TRAY) ×1 IMPLANT
PAD ARMBOARD POSITIONER FOAM (MISCELLANEOUS) ×2 IMPLANT
PAD CAST 4YDX4 CTTN HI CHSV (CAST SUPPLIES) ×3 IMPLANT
PEG GEMINUS SMOOTH LOCK 2.0X19 (Peg) IMPLANT
PEG SMOOTH LOCKING 20MM (Peg) IMPLANT
PLATE FOSSA STAND (Plate) ×1 IMPLANT
PLATE FOSSA STD (Plate) IMPLANT
SCREW CORT LOCK 3.5X12 TI (Screw) IMPLANT
SCREW GEMINUS PANL 3.5X13 (Screw) IMPLANT
SCREW POLY NON LOCK 3.5MMX12MM (Screw) IMPLANT
SCREW POLY NON LOCK 3.5MMX14MM (Screw) IMPLANT
SCREWDRIVER SURG ST 2 (INSTRUMENTS) IMPLANT
SOL PREP POV-IOD 4OZ 10% (MISCELLANEOUS) ×2 IMPLANT
SPIKE FLUID TRANSFER (MISCELLANEOUS) IMPLANT
SPONGE T-LAP 4X18 ~~LOC~~+RFID (SPONGE) IMPLANT
SUT ETHILON 4 0 PS 2 18 (SUTURE) IMPLANT
SUT MNCRL AB 4-0 PS2 18 (SUTURE) ×1 IMPLANT
SUT PROLENE 3 0 PS 2 (SUTURE) IMPLANT
SUT PROLENE 4 0 PS 2 18 (SUTURE) IMPLANT
SUT VIC AB 3-0 FS2 27 (SUTURE) IMPLANT
SYR CONTROL 10ML LL (SYRINGE) IMPLANT
SYSTEM CHEST DRAIN TLS 7FR (DRAIN) IMPLANT
TOWEL GREEN STERILE (TOWEL DISPOSABLE) ×1 IMPLANT
TOWEL GREEN STERILE FF (TOWEL DISPOSABLE) ×1 IMPLANT
TUBE CONNECTING 12X1/4 (SUCTIONS) ×1 IMPLANT
TUBE EVACUATION TLS (MISCELLANEOUS) ×1 IMPLANT
UNDERPAD 30X36 HEAVY ABSORB (UNDERPADS AND DIAPERS) ×1 IMPLANT
WATER STERILE IRR 1000ML POUR (IV SOLUTION) ×1 IMPLANT
WIRE FIX 1.5 STANDARD TIP (WIRE) ×2 IMPLANT
WIRE FIX 1.5 STD TIP (WIRE) IMPLANT

## 2023-08-30 NOTE — Discharge Instructions (Signed)
 Waylan Rocher, M.D. Hand Surgery  POST-OPERATIVE DISCHARGE INSTRUCTIONS   PRESCRIPTIONS: You may have been given a prescription to be taken as directed for post-operative pain control.  You may also take over the counter ibuprofen/aleve and tylenol for pain. Take this as directed on the packaging. Do not exceed 3000 mg tylenol/acetaminophen in 24 hours.  Ibuprofen 600-800 mg (3-4) tablets by mouth every 6 hours as needed for pain.  OR Aleve 2 tablets by mouth every 12 hours (twice daily) as needed for pain.  AND/OR Tylenol 1000 mg (2 tablets) every 8 hours as needed for pain.  Please use your pain medication carefully, as refills are limited and you may not be provided with one.  As stated above, please use over the counter pain medicine - it will also be helpful with decreasing your swelling.    ANESTHESIA: After your surgery, post-surgical discomfort or pain is likely. This discomfort can last several days to a few weeks. At certain times of the day your discomfort may be more intense.   Did you receive a nerve block?  A nerve block can provide pain relief for one hour to two days after your surgery. As long as the nerve block is working, you will experience little or no sensation in the area the surgeon operated on.  As the nerve block wears off, you will begin to experience pain or discomfort. It is very important that you begin taking your prescribed pain medication before the nerve block fully wears off. Treating your pain at the first sign of the block wearing off will ensure your pain is better controlled and more tolerable when full-sensation returns. Do not wait until the pain is intolerable, as the medicine will be less effective. It is better to treat pain in advance than to try and catch up.   General Anesthesia:  If you did not receive a nerve block during your surgery, you will need to start taking your pain medication shortly after your surgery and should continue  to do so as prescribed by your surgeon.     ICE AND ELEVATION: You may use ice for the first 48-72 hours, but it is not critical.   Motion of your fingers is very important to decrease the swelling.  Elevation, as much as possible for the next 48 hours, is critical for decreasing swelling as well as for pain relief. Elevation means when you are seated or lying down, you hand should be at or above your heart. When walking, the hand needs to be at or above the level of your elbow.  If the bandage gets too tight, it may need to be loosened. Please contact our office and we will instruct you in how to do this.    SURGICAL BANDAGES:  Keep your dressing and/or splint clean and dry at all times.  Do not remove until you are seen again in the office.  If careful, you may place a plastic bag over your bandage and tape the end to shower, but be careful, do not get your bandages wet.     HAND THERAPY:  You will be contacted to set up the date and time of your first therapy visit   ACTIVITY AND WORK: You are encouraged to move any fingers which are not in the bandage.  Light use of the fingers is allowed to assist the other hand with daily hygiene and eating, but strong gripping or lifting is often uncomfortable and should be avoided.  You  might miss a variable period of time from work and hopefully this issue has been discussed prior to surgery. You may not do any heavy work with your affected hand for about 2 weeks.    [facility] Second Floor, [address] [place], [place] [address] [phone]

## 2023-08-30 NOTE — Transfer of Care (Signed)
 Immediate Anesthesia Transfer of Care Note  Patient: Chelsea Cantrell  Procedure(s) Performed: OPEN REDUCTION INTERNAL FIXATION (ORIF) DISTAL RADIUS FRACTURE (Right: Wrist)  Patient Location: PACU  Anesthesia Type:MAC  Level of Consciousness: awake  Airway & Oxygen Therapy: Patient Spontanous Breathing  Post-op Assessment: Report given to RN and Post -op Vital signs reviewed and stable  Post vital signs: Reviewed and stable  Last Vitals:  Vitals Value Taken Time  BP 125/86 08/30/23 1747  Temp 36.5 C 08/30/23 1747  Pulse 62 08/30/23 1751  Resp 13 08/30/23 1751  SpO2 99 % 08/30/23 1751  Vitals shown include unfiled device data.  Last Pain:  Vitals:   08/30/23 1410  TempSrc:   PainSc: 0-No pain         Complications: No notable events documented.

## 2023-08-30 NOTE — Interval H&P Note (Signed)
 History and Physical Interval Note:  08/30/2023 1:44 PM  Chelsea Cantrell  has presented today for surgery, with the diagnosis of Right distal radius fracture.  The various methods of treatment have been discussed with the patient and family. After consideration of risks, benefits and other options for treatment, the patient has consented to  Procedure(s): OPEN REDUCTION INTERNAL FIXATION (ORIF) DISTAL RADIUS FRACTURE (Right) as a surgical intervention.  The patient's history has been reviewed, patient examined, no change in status, stable for surgery.  I have reviewed the patient's chart and labs.  Questions were answered to the patient's satisfaction.     [name]

## 2023-08-30 NOTE — Anesthesia Procedure Notes (Signed)
 Anesthesia Regional Block: Supraclavicular block   Pre-Anesthetic Checklist: , timeout performed,  Correct Patient, Correct Site, Correct Laterality,  Correct Procedure, Correct Position, site marked,  Risks and benefits discussed,  Surgical consent,  Pre-op evaluation,  At surgeon's request and post-op pain management  Laterality: Right  Prep: chloraprep       Needles:  Injection technique: Single-shot  Needle Type: Echogenic Stimulator Needle     Needle Length: 5cm  Needle Gauge: 22     Additional Needles:   Procedures:, nerve stimulator,,,,,     Nerve Stimulator or Paresthesia:  Response: biceps flexion, 0.45 mA  Additional Responses:   Narrative:  Start time: 08/30/2023 1:56 PM End time: 08/30/2023 2:04 PM Injection made incrementally with aspirations every 5 mL.  Performed by: Personally  Anesthesiologist: Achille Rich, MD  Additional Notes: Functioning IV was confirmed and monitors were applied.  A 50mm 22ga Arrow echogenic stimulator needle was used. Sterile prep and drape,hand hygiene and sterile gloves were used.  Negative aspiration and negative test dose prior to incremental administration of local anesthetic. The patient tolerated the procedure well.  Ultrasound guidance: relevent anatomy identified, needle position confirmed, local anesthetic spread visualized around nerve(s), vascular puncture avoided.  Image printed for medical record.

## 2023-08-30 NOTE — H&P (Signed)
 HAND SURGERY   HPI: Patient is a 78 y.o. female who presents with a closed, right distal radius after a ground level fall last week.  She presents today for surgical management of her fracture.  Patient denies any changes to their medical history or new systemic symptoms today.    Past Medical History:  Diagnosis Date   Allergy    Arthritis    Asthma    exercise induced   Colon polyps    Constipation    Dementia (HCC) 2024   per sister -in- law   IBS (irritable bowel syndrome)    Osteoporosis 03/24/2015   T score -2.6 left forearm. Left femoral neck -1.4, AP spine 0.1   Pancreatitis    Schatzki's ring    Seasonal affective disorder (HCC)    Spinal stenosis    Thyroid disease    Hypo   Past Surgical History:  Procedure Laterality Date   EUS N/A 06/12/2014   Procedure: UPPER ENDOSCOPIC ULTRASOUND (EUS) LINEAR;  Surgeon: Theda Belfast, MD;  Location: [facility];  Service: Endoscopy;  Laterality: N/A;   FOOT SURGERY Left    HIP SURGERY Right 2001   Replacement   JOINT REPLACEMENT     TONSILLECTOMY     Social History   Socioeconomic History   Marital status: Single    Spouse name: Not on file   Number of children: Not on file   Years of education: Not on file   Highest education level: Not on file  Occupational History   Not on file  Tobacco Use   Smoking status: Former    Current packs/day: 0.00    Types: Cigarettes    Quit date: 05/23/1974    Years since quitting: 49.3   Smokeless tobacco: Never  Substance and Sexual Activity   Alcohol use: Not Currently    Comment: rarely   Drug use: No   Sexual activity: Not Currently    Birth control/protection: Post-menopausal    Comment: 1st intercourse 78 yo-More than 5 partners  Other Topics Concern   Not on file  Social History Narrative   Social history: Single, never married.  No children.  Retired as an Psychologist, educational for [employer].  Did smoke prior to 1976 but currently non-smoker.  She is a vegetarian.  She  drinks wine.  Exercises routinely.       Family history: Father died at age 49 of lung cancer with history of MI at age 68.  Father also has hyperlipidemia.  Mother died at age 24 but had a stroke 4 years before she passed away.  1 brother and 1 sister in good health.       Social Drivers of Corporate investment banker Strain: Low Risk  (05/25/2023)   Overall Financial Resource Strain (CARDIA)    Difficulty of Paying Living Expenses: Not hard at all  Food Insecurity: No Food Insecurity (05/26/2023)   Hunger Vital Sign    Worried About Running Out of Food in the Last Year: Never true    Ran Out of Food in the Last Year: Never true  Transportation Needs: No Transportation Needs (05/26/2023)   PRAPARE - Administrator, Civil Service (Medical): No    Lack of Transportation (Non-Medical): No  Physical Activity: Sufficiently Active (05/25/2023)   Exercise Vital Sign    Days of Exercise per Week: 3 days    Minutes of Exercise per Session: 60 min  Stress: No Stress Concern Present (05/25/2023)   [place]  Institute of Occupational Health - Occupational Stress Questionnaire    Feeling of Stress : Only a little  Social Connections: Socially Isolated (05/25/2023)   Social Connection and Isolation Panel [NHANES]    Frequency of Communication with Friends and Family: More than three times a week    Frequency of Social Gatherings with Friends and Family: Twice a week    Attends Religious Services: Never    Database administrator or Organizations: No    Attends Engineer, structural: Never    Marital Status: Never married   Family History  Problem Relation Age of Onset   Stroke Mother    Osteoporosis Mother    Heart disease Father    Hyperlipidemia Father    Lung cancer Father    - negative except otherwise stated in the family history section Allergies  Allergen Reactions   Other Other (See Comments)    Environmental allergies/gets allergy injections   Prior to Admission  medications   Medication Sig Start Date End Date Taking? Authorizing Provider  Coenzyme Q10 (CO Q 10 PO) Take by mouth.   Yes [provider]  donepezil (ARICEPT) 10 MG tablet Take 1 tablet (10 mg total) by mouth at bedtime. 07/10/23  Yes Penumalli, Glenford Bayley, MD  DULoxetine (CYMBALTA) 30 MG capsule Take 30 mg by mouth 2 (two) times daily.   Yes [provider]  levothyroxine (SYNTHROID) 88 MCG tablet Take 1 tablet (88 mcg total) by mouth daily. 05/26/23  Yes Baxley, Luanna Cole, MD  memantine (NAMENDA) 10 MG tablet Take 1 tablet (10 mg total) by mouth 2 (two) times daily. 07/10/23  Yes Penumalli, Glenford Bayley, MD  Multiple Vitamin (MULTIVITAMIN) tablet Take 1 tablet by mouth daily.   Yes [provider]  Omega-3 Fatty Acids (FISH OIL) 1000 MG CAPS Take by mouth.   Yes [provider]  polyethylene glycol (MIRALAX / GLYCOLAX) packet Take 17 g by mouth 2 (two) times daily.   Yes [provider]  Digestive Enzymes CAPS Take by mouth.    [provider]  meloxicam (MOBIC) 15 MG tablet Take 1 tablet (15 mg total) by mouth daily. As needed for musculoskeletal pain. Take with food. 08/10/23   Margaree Mackintosh, MD   No results found. - Positive ROS: All other systems have been reviewed and were otherwise negative with the exception of those mentioned in the HPI and as above.  Physical Exam: General: No acute distress, resting comfortably Cardiovascular: BUE warm and well perfused, normal rate Respiratory: Normal WOB on RA Skin: Warm and dry Neurologic: Sensation intact distally Psychiatric: Patient is at baseline mood and affect  Right Upper Extremity  Sugartong splint is clean and dry.  Limited AROM of fingers secondary to pain and swelling.  SILT m/u/r distributions.  Hand warm and well perfused w/ BCR.   Assessment: 78 yo F w/ closed, right distal radius fracture.   Plan: OR today for ORIF right distal radius. We again reviewed the risks of surgery  which include bleeding, infection, damage to neurovascular structures, persistent symptoms, nonunion, malunion, delayed wound healing, need for additional surgery.  Informed consent was signed.  All questions were answered.   Marlyne Beards, M.D. [facility] 1:42 PM

## 2023-08-30 NOTE — Anesthesia Preprocedure Evaluation (Signed)
 Anesthesia Evaluation  Patient identified by MRN, date of birth, ID band Patient awake    Reviewed: Allergy & Precautions, H&P , NPO status , Patient's Chart, lab work & pertinent test results  Airway Mallampati: II   Neck ROM: full    Dental   Pulmonary asthma , former smoker   breath sounds clear to auscultation       Cardiovascular negative cardio ROS  Rhythm:regular Rate:Normal     Neuro/Psych  PSYCHIATRIC DISORDERS  Depression   Dementia    GI/Hepatic ,GERD  ,,  Endo/Other  Hypothyroidism    Renal/GU      Musculoskeletal  (+) Arthritis ,    Abdominal   Peds  Hematology   Anesthesia Other Findings   Reproductive/Obstetrics                             Anesthesia Physical Anesthesia Plan  ASA: 3  Anesthesia Plan: MAC and Regional   Post-op Pain Management:    Induction: Intravenous  PONV Risk Score and Plan: 2 and Propofol infusion and Treatment may vary due to age or medical condition  Airway Management Planned: Simple Face Mask  Additional Equipment:   Intra-op Plan:   Post-operative Plan:   Informed Consent: I have reviewed the patients History and Physical, chart, labs and discussed the procedure including the risks, benefits and alternatives for the proposed anesthesia with the patient or authorized representative who has indicated his/her understanding and acceptance.     Dental advisory given  Plan Discussed with: CRNA, Anesthesiologist and Surgeon  Anesthesia Plan Comments:        Anesthesia Quick Evaluation

## 2023-08-30 NOTE — Op Note (Signed)
 Date of Surgery: 08/30/2023  INDICATIONS: Patient is a 78 y.o.-year-old female with a right distal radius fracture after a ground level fall.  Risks, benefits, and alternatives to surgery were again discussed with the patient in the preoperative area. The patient wishes to proceed with surgery.  Informed consent was signed after our discussion.   PREOPERATIVE DIAGNOSIS:  Right distal radius fracture  POSTOPERATIVE DIAGNOSIS: Same.  PROCEDURE:  Open reduction and internal fixation of right distal radius fracture, extra-articular Right brachioradialis tenotomy   SURGEON: Waylan Rocher, M.D.  ASSIST: None  ANESTHESIA:  Regional, MAC  IV FLUIDS AND URINE: See anesthesia.  ESTIMATED BLOOD LOSS: 10 mL.  IMPLANTS:  Implant Name Type Inv. Item Serial No. Manufacturer Lot No. LRB No. Used Action  LOG [id number] -  BIOMET DVR HAND INNOVATIONS SET - 1          SCREW POLY NON LOCK 3.1YNW29FA - [id number] Screw SCREW POLY NON LOCK 3.6NGE95MW  SKELETAL DYNAMICS  Right 1 Implanted  SCREW GEMINUS PANL 3.5X13 - [id number] Screw SCREW GEMINUS PANL 3.5X13  SKELETAL DYNAMICS  Right 1 Implanted  PEG GEMINUS SMOOTH LOCK 2.0X19 - [id number] Peg PEG GEMINUS SMOOTH LOCK 2.0X19  SKELETAL DYNAMICS  Right 5 Implanted  PEG GEMINUS SMOOTH LOCK 2.0X19 - [id number] Peg PEG GEMINUS SMOOTH LOCK 2.0X19  SKELETAL DYNAMICS  Right 1 Implanted  PLATE FOSSA STAND - [id number] Plate PLATE FOSSA STAND  SKELETAL DYNAMICS  Right 1 Implanted  SCREW CORT LOCK 3.5X12 TI - [id number] Screw SCREW CORT LOCK 3.5X12 TI  SKELETAL DYNAMICS  Right 2 Implanted  PEG SMOOTH LOCKING - [id number] Peg PEG SMOOTH LOCKING  SKELETAL DYNAMICS  Right 1 Implanted  SCREW POLY NON LOCK 3.5DDU20UR - [id number] Screw SCREW POLY NON LOCK 3.8BTD17OH  SKELETAL DYNAMICS  Right 1 Implanted     DRAINS: None  COMPLICATIONS: None noted  DESCRIPTION OF PROCEDURE: The patient was met in the preoperative holding area where the surgical site was  marked and the consent form was signed.  The patient was then taken to the operating room and transferred to the operating table.  All bony prominences were well padded.  A tourniquet was applied to the right upper arm.  Monitored anesthesia was induced.  The operative extremity was prepped and draped in the usual and sterile fashion.  Preoperative antibiotics were given.  A formal time-out was performed to confirm that this was the correct patient, surgery, side, and site.   Following formal timeout, a longitudinal incision was made directly overlying the flexor carpi radialis tendon.  The skin was incised.  Small crossing vessels were coagulated with bipolar cautery.  The volar aspect of the flexor carpi radialis tendon sheath was incised longitudinally.  The FCR tendon was retracted radially.  The floor of the FCR tendon sheath was similarly divided using a 15 blade scalpel.  Blunt dissection was used to develop Parona's space between the flexor pollicis longus and pronator quadratus.  The flexor pollicis longus was retracted ulnarly.  An L-shaped tenotomy was performed along the distal and radial aspect of the pronator quadratus tendon.  The pronator quadratus was subperiosteally dissected off of the volar aspect of the radius and the distal fracture fragments using an elevator.  Blunt dissection was used to identify the brachioradialis tendon as it inserted on the radial aspect of the distal radius.  The underlying first dorsal compartment tendons were identified and protected.  A tenotomy of the brachioradialis was performed to aid in reduction of the  fracture.  At this point, the small metal interposed brachioradialis tissue was debrided from the fracture site using a small curette.  The fracture was reduced with longitudinal traction and gentle manipulation.  A 3 hole standard plate was then applied to the volar surface of the radius and secured using a bicortical nonlocking screw through the oblong hole  of the plate.  The K wires were placed in the aiming guides at the radial and ulnar aspects of the plate.  AP and lateral views showed that the fracture was appropriately reduced.  The K wires were in a good, subchondral location.  The distal holes in the plate were then filled with smooth locking pegs taking care that the pegs did not penetrate the dorsal cortex.  The 2 remaining holes in the shaft of the plate were then filled with bicortical locking screws.  There was minimal purchase of the bicortical screw in the central portion of the plate.  As a result, the screw was removed.  AP, lateral, and 30 degree lateral view showed that the fracture was appropriately reduced, the plate was in appropriate position, and all the distal locking screws were in a good, subchondral location.  The DRUJ was examined in neutral, supination, and pronation and found to be stable.  The wound was then thoroughly irrigated with copious sterile saline.  The skin was then closed using a 4-0 Monocryl suture in a buried interrupted fashion.  The tourniquet was then deflated.  The fingertips were warm and well-perfused with brisk capillary refill.  Hemostasis was achieved with direct pressure over the wound.  The skin was then closed using a 4-0 nylon suture in horizontal mattress fashion.  The wound was then cleaned and dressed with Xeroform, folded Kerlix, cast padding, and a well-padded short arm volar splint was applied.   The patient was reversed from sedation.  They were transferred from the operating table to the postoperative bed.  All counts were correct x 2 at the end of the procedure.  The patient was then taken to the PACU in stable condition.   POSTOPERATIVE PLAN: She will be discharged home with appropriate pain medication and discharge instructions.  A referral be placed to hand therapy to begin the rehab process.  I will see her back in the office in approximately 2 weeks for her first postop visit.  We will obtain  x-rays out of the splint at that time.  Waylan Rocher, MD 5:42 PM

## 2023-08-31 ENCOUNTER — Other Ambulatory Visit: Payer: Self-pay

## 2023-08-31 NOTE — Telephone Encounter (Signed)
 Cancelled reorder and closing encounter as patient has 3 refills available at pharmacy.

## 2023-08-31 NOTE — Anesthesia Postprocedure Evaluation (Signed)
 Anesthesia Post Note  Patient: Chelsea Cantrell  Procedure(s) Performed: OPEN REDUCTION INTERNAL FIXATION (ORIF) DISTAL RADIUS FRACTURE (Right: Wrist)     Patient location during evaluation: PACU Anesthesia Type: Regional and MAC Level of consciousness: awake and alert Pain management: pain level controlled Vital Signs Assessment: post-procedure vital signs reviewed and stable Respiratory status: spontaneous breathing, nonlabored ventilation, respiratory function stable and patient connected to nasal cannula oxygen Cardiovascular status: blood pressure returned to baseline and stable Postop Assessment: no apparent nausea or vomiting Anesthetic complications: no   No notable events documented.  Last Vitals:  Vitals:   08/30/23 1800 08/30/23 1815  BP: (!) 145/97 (!) 155/66  Pulse: (!) 58 60  Resp: 14 16  Temp:  36.5 C  SpO2: 99% 99%    Last Pain:  Vitals:   08/30/23 1815  TempSrc:   PainSc: 0-No pain                 Antwone Capozzoli S

## 2023-08-31 NOTE — Telephone Encounter (Signed)
 Called pt and and pt's sister stated that pt needs refill of med and advised yes she should be on the med.  Copied from CRM [id number]. Topic: Clinical - Medication Question >> Aug 31, 2023 10:41 AM Franchot Heidelberg wrote: Reason for CRM: Pt wants to know if she should still be taking synthroid  Best contact: [phone] (sister in law)

## 2023-09-01 ENCOUNTER — Encounter (HOSPITAL_COMMUNITY): Payer: Self-pay | Admitting: Orthopedic Surgery

## 2023-09-05 ENCOUNTER — Other Ambulatory Visit: Payer: Self-pay | Admitting: Internal Medicine

## 2023-09-05 NOTE — Telephone Encounter (Signed)
 Copied from CRM [id number]. Topic: Clinical - Medication Refill >> Sep 05, 2023 11:51 AM Opal Bill wrote: Most Recent Primary Care Visit:  Provider: Sylvan Evener  Department: [facility]  Visit Type: OFFICE VISIT  Date: 08/10/2023  Medication: levothyroxine (SYNTHROID) 88 MCG tablet  Has the patient contacted their pharmacy? Yes, Pharmacy said that the rx on file was cancelled and they need a new rx.  Is this the correct pharmacy for this prescription? Yes If no, delete pharmacy and type the correct one.  This is the patient's preferred pharmacy:  [id number] [place], [place] - [address] AT [facility] [address] [facility] [address] [facility] [address] [place] [address] Phone: [phone] Fax: [phone]  Has the prescription been filled recently? Yes  Is the patient out of the medication? No, has 5 days left  Has the patient been seen for an appointment in the last year OR does the patient have an upcoming appointment? Yes  Can we respond through MyChart? Yes  Agent: Please be advised that Rx refills may take up to 3 business days. We ask that you follow-up with your pharmacy.

## 2023-09-12 DIAGNOSIS — M25531 Pain in right wrist: Secondary | ICD-10-CM | POA: Diagnosis not present

## 2023-09-12 DIAGNOSIS — S52501D Unspecified fracture of the lower end of right radius, subsequent encounter for closed fracture with routine healing: Secondary | ICD-10-CM | POA: Diagnosis not present

## 2023-09-15 ENCOUNTER — Telehealth: Payer: Self-pay | Admitting: *Deleted

## 2023-09-15 DIAGNOSIS — M79641 Pain in right hand: Secondary | ICD-10-CM | POA: Diagnosis not present

## 2023-09-15 NOTE — Telephone Encounter (Signed)
 Copied from CRM [id number]. Topic: Clinical - Medication Refill >> Sep 05, 2023 11:51 AM Opal Bill wrote: Most Recent Primary Care Visit:  Provider: Sylvan Evener  Department: [facility]  Visit Type: OFFICE VISIT  Date: 08/10/2023  Medication: levothyroxine  (SYNTHROID ) 88 MCG tablet  Has the patient contacted their pharmacy? Yes, Pharmacy said that the rx on file was cancelled and they need a new rx on file.  Is this the correct pharmacy for this prescription? Yes If no, delete pharmacy and type the correct one.  This is the patient's preferred pharmacy:  [id number] [place], [place] - [address] AT [facility] [address] & [facility] [address] [place] [address] Phone: [phone] Fax: [phone]  [id number] - [place], [place] - [address] AT [facility] [address] [place] [address] Phone: [phone] Fax: [phone]   Has the prescription been filled recently? Yes  Is the patient out of the medication? No, has 5 days left  Has the patient been seen for an appointment in the last year OR does the patient have an upcoming appointment? Yes  Can we respond through MyChart? Yes  Agent: Please be advised that Rx refills may take up to 3 business days. We ask that you follow-up with your pharmacy. >> Sep 12, 2023  1:17 PM Fredrica W wrote: Patient called back. States she is unable to get this filled at [facility]. They are saying that her insurance will not cover it and they need the Generic sent in. Confirmed pharmacy it was sent to was correct. Patient has not changed insurance. She thinks it may be [facility] problem but wanted to let provider know she has been off medication for a while and is trying to get back on it but unable to get it filled. Thank You

## 2023-09-15 NOTE — Telephone Encounter (Signed)
 Patient is not due for refill. Last filled 05/26/2023 with 3 refills. 90 day supply.

## 2023-09-18 DIAGNOSIS — M25531 Pain in right wrist: Secondary | ICD-10-CM | POA: Diagnosis not present

## 2023-09-22 DIAGNOSIS — M25531 Pain in right wrist: Secondary | ICD-10-CM | POA: Diagnosis not present

## 2023-09-25 ENCOUNTER — Telehealth: Payer: Self-pay

## 2023-09-25 DIAGNOSIS — M25531 Pain in right wrist: Secondary | ICD-10-CM | POA: Diagnosis not present

## 2023-09-25 MED ORDER — LEVOTHYROXINE SODIUM 88 MCG PO TABS: 88.0000 ug | ORAL_TABLET | Freq: Every day | ORAL | 3 refills | Status: DC

## 2023-09-25 NOTE — Telephone Encounter (Signed)
 Copied from CRM [id number]. Topic: Clinical - Prescription Issue >> Sep 19, 2023  9:32 AM Chelsea Cantrell wrote: Reason for CRM: The patient needs her levothyroxine  (SYNTHROID ) 88 MCG tablet changed to the generic version because her insurance will not cover it.

## 2023-09-29 DIAGNOSIS — M25531 Pain in right wrist: Secondary | ICD-10-CM | POA: Diagnosis not present

## 2023-10-02 DIAGNOSIS — M79641 Pain in right hand: Secondary | ICD-10-CM | POA: Diagnosis not present

## 2023-10-06 DIAGNOSIS — M25531 Pain in right wrist: Secondary | ICD-10-CM | POA: Diagnosis not present

## 2023-10-09 DIAGNOSIS — M25531 Pain in right wrist: Secondary | ICD-10-CM | POA: Diagnosis not present

## 2023-10-13 DIAGNOSIS — M79641 Pain in right hand: Secondary | ICD-10-CM | POA: Diagnosis not present

## 2023-10-13 DIAGNOSIS — S52501D Unspecified fracture of the lower end of right radius, subsequent encounter for closed fracture with routine healing: Secondary | ICD-10-CM | POA: Diagnosis not present

## 2023-10-17 DIAGNOSIS — M79641 Pain in right hand: Secondary | ICD-10-CM | POA: Diagnosis not present

## 2023-10-20 DIAGNOSIS — M79641 Pain in right hand: Secondary | ICD-10-CM | POA: Diagnosis not present

## 2023-10-21 ENCOUNTER — Other Ambulatory Visit: Payer: Self-pay | Admitting: Internal Medicine

## 2023-10-23 DIAGNOSIS — M25531 Pain in right wrist: Secondary | ICD-10-CM | POA: Diagnosis not present

## 2023-10-30 DIAGNOSIS — M25531 Pain in right wrist: Secondary | ICD-10-CM | POA: Diagnosis not present

## 2023-11-06 DIAGNOSIS — M79641 Pain in right hand: Secondary | ICD-10-CM | POA: Diagnosis not present

## 2023-11-23 ENCOUNTER — Telehealth: Payer: Self-pay | Admitting: Internal Medicine

## 2023-11-23 ENCOUNTER — Other Ambulatory Visit: Payer: Self-pay | Admitting: Internal Medicine

## 2023-11-23 NOTE — Telephone Encounter (Signed)
 Copied from CRM [id number]. Topic: Clinical - Medication Refill >> Nov 23, 2023  1:30 PM Cleave MATSU wrote: Medication: Aricept /Synthroid    Has the patient contacted their pharmacy? No (Agent: If no, request that the patient contact the pharmacy for the refill. If patient does not wish to contact the pharmacy document the reason why and proceed with request.) (Agent: If yes, when and what did the pharmacy advise?)  This is the patient's preferred pharmacy:  [facility] [address] [place] - [address] AT [facility] [address] [place] [address] Phone: [phone] Fax: [phone]  Is this the correct pharmacy for this prescription? Yes If no, delete pharmacy and type the correct one.   Has the prescription been filled recently? No  Is the patient out of the medication? Yes  Has the patient been seen for an appointment in the last year OR does the patient have an upcoming appointment? Yes  Can we respond through MyChart? Yes  Agent: Please be advised that Rx refills may take up to 3 business days. We ask that you follow-up with your pharmacy.

## 2023-11-23 NOTE — Telephone Encounter (Signed)
 Copied from CRM [id number]. Topic: Clinical - Medication Refill >> Nov 23, 2023  1:24 PM Cleave MATSU wrote: Medication: namenda   Has the patient contacted their pharmacy? No (Agent: If no, request that the patient contact the pharmacy for the refill. If patient does not wish to contact the pharmacy document the reason why and proceed with request.) (Agent: If yes, when and what did the pharmacy advise?)  This is the patient's preferred pharmacy:  [facility] - [address] [place]  [address] Phone number [phone]   Is this the correct pharmacy for this prescription? Yes If no, delete pharmacy and type the correct one.   Has the prescription been filled recently? No  Is the patient out of the medication? She lost the medication   Has the patient been seen for an appointment in the last year OR does the patient have an upcoming appointment? Yes  Can we respond through MyChart? Yes  Agent: Please be advised that Rx refills may take up to 3 business days. We ask that you follow-up with your pharmacy.

## 2023-11-29 MED ORDER — LEVOTHYROXINE SODIUM 88 MCG PO TABS: 88.0000 ug | ORAL_TABLET | Freq: Every day | ORAL | 3 refills | Status: AC

## 2023-11-29 MED ORDER — MEMANTINE HCL 10 MG PO TABS: 10.0000 mg | ORAL_TABLET | Freq: Two times a day (BID) | ORAL | 12 refills | Status: AC

## 2023-11-29 MED ORDER — DONEPEZIL HCL 10 MG PO TABS: 10.0000 mg | ORAL_TABLET | Freq: Every day | ORAL | 12 refills | Status: AC

## 2023-12-11 DIAGNOSIS — K581 Irritable bowel syndrome with constipation: Secondary | ICD-10-CM | POA: Diagnosis not present

## 2023-12-11 DIAGNOSIS — K222 Esophageal obstruction: Secondary | ICD-10-CM | POA: Diagnosis not present

## 2023-12-11 DIAGNOSIS — G301 Alzheimer's disease with late onset: Secondary | ICD-10-CM | POA: Diagnosis not present

## 2023-12-11 DIAGNOSIS — M810 Age-related osteoporosis without current pathological fracture: Secondary | ICD-10-CM | POA: Diagnosis not present

## 2023-12-11 DIAGNOSIS — Z13220 Encounter for screening for lipoid disorders: Secondary | ICD-10-CM | POA: Diagnosis not present

## 2023-12-11 DIAGNOSIS — M51371 Other intervertebral disc degeneration, lumbosacral region with lower extremity pain only: Secondary | ICD-10-CM | POA: Diagnosis not present

## 2023-12-11 DIAGNOSIS — Z789 Other specified health status: Secondary | ICD-10-CM | POA: Diagnosis not present

## 2023-12-11 DIAGNOSIS — E039 Hypothyroidism, unspecified: Secondary | ICD-10-CM | POA: Diagnosis not present

## 2023-12-11 DIAGNOSIS — F02A0 Dementia in other diseases classified elsewhere, mild, without behavioral disturbance, psychotic disturbance, mood disturbance, and anxiety: Secondary | ICD-10-CM | POA: Diagnosis not present

## 2023-12-11 DIAGNOSIS — G96191 Perineural cyst: Secondary | ICD-10-CM | POA: Diagnosis not present

## 2023-12-11 DIAGNOSIS — Z8719 Personal history of other diseases of the digestive system: Secondary | ICD-10-CM | POA: Diagnosis not present

## 2023-12-27 ENCOUNTER — Encounter: Payer: No Typology Code available for payment source | Admitting: Psychology

## 2024-02-06 DIAGNOSIS — R300 Dysuria: Secondary | ICD-10-CM | POA: Diagnosis not present

## 2024-02-06 DIAGNOSIS — N3000 Acute cystitis without hematuria: Secondary | ICD-10-CM | POA: Diagnosis not present
